# Patient Record
Sex: Male | Born: 1945 | Race: White | Hispanic: No | Smoking: Former smoker
Health system: Southern US, Community
[De-identification: ages and names within clinical notes are randomized; demographics above are authoritative.]

## PROBLEM LIST (undated history)

## (undated) DIAGNOSIS — R011 Cardiac murmur, unspecified: Secondary | ICD-10-CM

## (undated) DIAGNOSIS — Z9484 Stem cells transplant status: Secondary | ICD-10-CM

## (undated) DIAGNOSIS — G8929 Other chronic pain: Secondary | ICD-10-CM

## (undated) DIAGNOSIS — C9000 Multiple myeloma not having achieved remission: Secondary | ICD-10-CM

## (undated) DIAGNOSIS — E119 Type 2 diabetes mellitus without complications: Secondary | ICD-10-CM

## (undated) DIAGNOSIS — M549 Dorsalgia, unspecified: Secondary | ICD-10-CM

## (undated) DIAGNOSIS — I10 Essential (primary) hypertension: Secondary | ICD-10-CM

## (undated) DIAGNOSIS — K219 Gastro-esophageal reflux disease without esophagitis: Secondary | ICD-10-CM

## (undated) DIAGNOSIS — E785 Hyperlipidemia, unspecified: Secondary | ICD-10-CM

## (undated) DIAGNOSIS — G4730 Sleep apnea, unspecified: Secondary | ICD-10-CM

## (undated) DIAGNOSIS — R001 Bradycardia, unspecified: Secondary | ICD-10-CM

## (undated) DIAGNOSIS — F419 Anxiety disorder, unspecified: Secondary | ICD-10-CM

## (undated) DIAGNOSIS — I219 Acute myocardial infarction, unspecified: Secondary | ICD-10-CM

## (undated) DIAGNOSIS — I639 Cerebral infarction, unspecified: Secondary | ICD-10-CM

## (undated) DIAGNOSIS — Z8719 Personal history of other diseases of the digestive system: Secondary | ICD-10-CM

## (undated) DIAGNOSIS — J42 Unspecified chronic bronchitis: Secondary | ICD-10-CM

## (undated) DIAGNOSIS — F329 Major depressive disorder, single episode, unspecified: Secondary | ICD-10-CM

## (undated) DIAGNOSIS — J449 Chronic obstructive pulmonary disease, unspecified: Secondary | ICD-10-CM

## (undated) DIAGNOSIS — I959 Hypotension, unspecified: Secondary | ICD-10-CM

## (undated) DIAGNOSIS — R569 Unspecified convulsions: Secondary | ICD-10-CM

## (undated) DIAGNOSIS — F32A Depression, unspecified: Secondary | ICD-10-CM

## (undated) DIAGNOSIS — I2510 Atherosclerotic heart disease of native coronary artery without angina pectoris: Secondary | ICD-10-CM

## (undated) DIAGNOSIS — D649 Anemia, unspecified: Secondary | ICD-10-CM

## (undated) DIAGNOSIS — M1990 Unspecified osteoarthritis, unspecified site: Secondary | ICD-10-CM

## (undated) DIAGNOSIS — N189 Chronic kidney disease, unspecified: Secondary | ICD-10-CM

## (undated) DIAGNOSIS — B029 Zoster without complications: Secondary | ICD-10-CM

## (undated) DIAGNOSIS — Z8781 Personal history of (healed) traumatic fracture: Secondary | ICD-10-CM

## (undated) DIAGNOSIS — E039 Hypothyroidism, unspecified: Secondary | ICD-10-CM

## (undated) HISTORY — PX: LAPAROSCOPIC CHOLECYSTECTOMY: SUR755

## (undated) HISTORY — DX: Atherosclerotic heart disease of native coronary artery without angina pectoris: I25.10

## (undated) HISTORY — DX: Gastro-esophageal reflux disease without esophagitis: K21.9

## (undated) HISTORY — DX: Cerebral infarction, unspecified: I63.9

## (undated) HISTORY — DX: Major depressive disorder, single episode, unspecified: F32.9

## (undated) HISTORY — PX: TONSILLECTOMY: SUR1361

## (undated) HISTORY — DX: Multiple myeloma not having achieved remission: C90.00

## (undated) HISTORY — DX: Unspecified convulsions: R56.9

## (undated) HISTORY — DX: Zoster without complications: B02.9

## (undated) HISTORY — PX: CAROTID ARTERY ANGIOPLASTY: SHX1300

## (undated) HISTORY — DX: Depression, unspecified: F32.A

## (undated) SURGERY — Surgical Case
Anesthesia: *Unknown

---

## 2002-01-14 HISTORY — PX: CORONARY ARTERY BYPASS GRAFT: SHX141

## 2011-01-15 DIAGNOSIS — C9000 Multiple myeloma not having achieved remission: Secondary | ICD-10-CM

## 2011-01-15 HISTORY — DX: Multiple myeloma not having achieved remission: C90.00

## 2015-01-15 DIAGNOSIS — R569 Unspecified convulsions: Secondary | ICD-10-CM

## 2015-01-15 HISTORY — DX: Unspecified convulsions: R56.9

## 2015-10-06 ENCOUNTER — Emergency Department (HOSPITAL_COMMUNITY)
Admission: EM | Admit: 2015-10-06 | Discharge: 2015-10-06 | Disposition: A | Discharge status: Home / Self Care | Payer: Self-pay | Attending: Emergency Medicine | Admitting: Emergency Medicine

## 2015-10-06 ENCOUNTER — Encounter (HOSPITAL_COMMUNITY): Payer: Self-pay

## 2015-10-06 DIAGNOSIS — R739 Hyperglycemia, unspecified: Secondary | ICD-10-CM

## 2015-10-06 DIAGNOSIS — E039 Hypothyroidism, unspecified: Secondary | ICD-10-CM | POA: Insufficient documentation

## 2015-10-06 DIAGNOSIS — Z87891 Personal history of nicotine dependence: Secondary | ICD-10-CM | POA: Insufficient documentation

## 2015-10-06 DIAGNOSIS — I10 Essential (primary) hypertension: Secondary | ICD-10-CM | POA: Insufficient documentation

## 2015-10-06 DIAGNOSIS — E1165 Type 2 diabetes mellitus with hyperglycemia: Secondary | ICD-10-CM | POA: Insufficient documentation

## 2015-10-06 DIAGNOSIS — Z7982 Long term (current) use of aspirin: Secondary | ICD-10-CM | POA: Insufficient documentation

## 2015-10-06 DIAGNOSIS — J449 Chronic obstructive pulmonary disease, unspecified: Secondary | ICD-10-CM | POA: Insufficient documentation

## 2015-10-06 DIAGNOSIS — Z79899 Other long term (current) drug therapy: Secondary | ICD-10-CM | POA: Insufficient documentation

## 2015-10-06 HISTORY — DX: Hypothyroidism, unspecified: E03.9

## 2015-10-06 HISTORY — DX: Hyperlipidemia, unspecified: E78.5

## 2015-10-06 HISTORY — DX: Essential (primary) hypertension: I10

## 2015-10-06 HISTORY — DX: Chronic obstructive pulmonary disease, unspecified: J44.9

## 2015-10-06 LAB — CBG MONITORING, ED
GLUCOSE-CAPILLARY: 400 mg/dL — AB (ref 65–99)
Glucose-Capillary: 302 mg/dL — ABNORMAL HIGH (ref 65–99)

## 2015-10-06 LAB — CBC WITH DIFFERENTIAL/PLATELET
Basophils Absolute: 0 10*3/uL (ref 0.0–0.1)
Basophils Relative: 1 %
Eosinophils Absolute: 0 10*3/uL (ref 0.0–0.7)
Eosinophils Relative: 0 %
HCT: 32.1 % — ABNORMAL LOW (ref 39.0–52.0)
Hemoglobin: 10.7 g/dL — ABNORMAL LOW (ref 13.0–17.0)
Lymphocytes Relative: 16 %
Lymphs Abs: 1 10*3/uL (ref 0.7–4.0)
MCH: 27.3 pg (ref 26.0–34.0)
MCHC: 33.3 g/dL (ref 30.0–36.0)
MCV: 81.9 fL (ref 78.0–100.0)
Monocytes Absolute: 0.5 10*3/uL (ref 0.1–1.0)
Monocytes Relative: 8 %
Neutro Abs: 4.7 10*3/uL (ref 1.7–7.7)
Neutrophils Relative %: 75 %
Platelets: 110 10*3/uL — ABNORMAL LOW (ref 150–400)
RBC: 3.92 MIL/uL — ABNORMAL LOW (ref 4.22–5.81)
RDW: 16.7 % — ABNORMAL HIGH (ref 11.5–15.5)
WBC: 6.2 10*3/uL (ref 4.0–10.5)

## 2015-10-06 LAB — BASIC METABOLIC PANEL
Anion gap: 5 (ref 5–15)
BUN: 32 mg/dL — ABNORMAL HIGH (ref 6–20)
CO2: 26 mmol/L (ref 22–32)
Calcium: 9.3 mg/dL (ref 8.9–10.3)
Chloride: 105 mmol/L (ref 101–111)
Creatinine, Ser: 1.49 mg/dL — ABNORMAL HIGH (ref 0.61–1.24)
GFR calc Af Amer: 52 mL/min — ABNORMAL LOW (ref >60)
GFR calc non Af Amer: 45 mL/min — ABNORMAL LOW (ref >60)
Glucose, Bld: 397 mg/dL — ABNORMAL HIGH (ref 65–99)
Potassium: 5.9 mmol/L — ABNORMAL HIGH (ref 3.5–5.1)
Sodium: 136 mmol/L (ref 135–145)

## 2015-10-06 LAB — URINALYSIS, ROUTINE W REFLEX MICROSCOPIC
Bilirubin Urine: NEGATIVE
Glucose, UA: >1000 mg/dL — AB
Hgb urine dipstick: NEGATIVE
Ketones, ur: NEGATIVE mg/dL
Leukocytes, UA: NEGATIVE
Nitrite: NEGATIVE
Protein, ur: 30 mg/dL — AB
Specific Gravity, Urine: 1.022 (ref 1.005–1.030)
pH: 5.5 (ref 5.0–8.0)

## 2015-10-06 LAB — URINE MICROSCOPIC-ADD ON
Bacteria, UA: NONE SEEN
RBC / HPF: NONE SEEN RBC/hpf (ref 0–5)

## 2015-10-06 MED ORDER — SODIUM CHLORIDE 0.9 % IV BOLUS (SEPSIS)
1000.0000 mL | Freq: Once | INTRAVENOUS | Status: AC
  Administered 2015-10-06: 1000 mL via INTRAVENOUS

## 2015-10-06 MED ORDER — CARVEDILOL 12.5 MG PO TABS
12.5000 mg | ORAL_TABLET | Freq: Two times a day (BID) | ORAL | Status: DC
  Administered 2015-10-06: 12.5 mg via ORAL
  Filled 2015-10-06 (×3): qty 1

## 2015-10-06 MED ORDER — INSULIN ASPART 100 UNIT/ML ~~LOC~~ SOLN
5.0000 [IU] | Freq: Once | SUBCUTANEOUS | Status: AC
  Administered 2015-10-06: 5 [IU] via SUBCUTANEOUS
  Filled 2015-10-06: qty 1

## 2015-10-06 MED ORDER — CLONIDINE HCL 0.1 MG PO TABS
0.1000 mg | ORAL_TABLET | Freq: Two times a day (BID) | ORAL | Status: DC
  Administered 2015-10-06: 0.1 mg via ORAL
  Filled 2015-10-06: qty 1

## 2015-10-06 NOTE — ED Triage Notes (Signed)
Per EMS, Pt, from [facility] [phone] Dr, [place]), c/o weakness x 2 days and hyperglycemia.  Denies pain.  Initial BP 86/60.  1L NS given en route.  CBG 429.  Hx of DM and HTN.

## 2015-10-06 NOTE — ED Provider Notes (Signed)
[facility] DEPT Provider Note   CSN: [id number] Arrival date & time: 10/06/15  1359     History   Chief Complaint Chief Complaint  Patient presents with  . Weakness  . Hyperglycemia    HPI Douglas Mccoy is a 70 y.o. male.  HPI   70 year old male presents today with complaints of weakness and hyperglycemia. Patient notes a significant past medical history of hypoglycemia hypertension. Was recently hospitalized at the [facility] for hypertension. Home hospital he was noted to originally have elevated CBG, diet modifications in the hospital lowered his blood sugar down to a reasonable level, that did not require any medical intervention. He was discharged home without any medications for management of elevated blood sugar. Patient notes that since being discharged from the hospital he has been [facility] the homeless shelter. He notes that he has been drinking excessive amounts of Kool-Aid, and bread. Patient noted to have blood sugar in 400s today, fatigue, thirst. Patient denies any neurological deficits, abdominal pain nausea or vomiting. No recent illnesses.  Past Medical History:  Diagnosis Date  . COPD (chronic obstructive pulmonary disease) ([place])   . Diabetes mellitus without complication ([place])   . Hyperlipidemia   . Hypertension   . Hypothyroidism     There are no active problems to display for this patient.   Past Surgical History:  Procedure Laterality Date  . BACK SURGERY     fracture  . BYPASS GRAFT     x4  . CAROTID ARTERY ANGIOPLASTY     Right carotid artery "cleaned out"  . CHOLECYSTECTOMY         Home Medications    Prior to Admission medications   Medication Sig Start Date End Date Taking? Authorizing Provider  acyclovir (ZOVIRAX) 200 MG capsule Take 200 mg by mouth 2 (two) times daily.   Yes Historical Provider, MD  aspirin 325 MG tablet Take 325 mg by mouth daily.   Yes Historical Provider, MD  carvedilol (COREG) 12.5 MG tablet Take 6.25 mg by mouth  2 (two) times daily with a meal.   Yes Historical Provider, MD  cloNIDine (CATAPRES) 0.1 MG tablet Take 0.1 mg by mouth 2 (two) times daily.   Yes Historical Provider, MD  guaiFENesin (MUCINEX) 600 MG 12 hr tablet Take 600 mg by mouth 2 (two) times daily as needed for cough.    Yes Historical Provider, MD  ibuprofen (ADVIL,MOTRIN) 600 MG tablet Take 600 mg by mouth every 8 (eight) hours as needed for moderate pain.    Yes Historical Provider, MD  levETIRAcetam (KEPPRA) 500 MG tablet Take 500 mg by mouth 2 (two) times daily.   Yes Historical Provider, MD  levothyroxine (SYNTHROID, LEVOTHROID) 25 MCG tablet Take 25 mcg by mouth at bedtime.   Yes Historical Provider, MD  lisinopril (PRINIVIL,ZESTRIL) 40 MG tablet Take 40 mg by mouth daily.   Yes Historical Provider, MD  magnesium oxide (MAG-OX) 400 MG tablet Take 800 mg by mouth 2 (two) times daily.    Yes Historical Provider, MD  potassium chloride SA (K-DUR,KLOR-CON) 20 MEQ tablet Take 20 mEq by mouth daily.   Yes Historical Provider, MD  sertraline (ZOLOFT) 100 MG tablet Take 50 mg by mouth daily.   Yes Historical Provider, MD  simvastatin (ZOCOR) 10 MG tablet Take 10 mg by mouth at bedtime.   Yes Historical Provider, MD  tiZANidine (ZANAFLEX) 4 MG capsule Take 4 mg by mouth 3 (three) times daily as needed for muscle spasms.   Yes Historical  Provider, MD    Family History History reviewed. No pertinent family history.  Social History Social History  Substance Use Topics  . Smoking status: Former Research scientist (life sciences)  . Smokeless tobacco: Never Used  . Alcohol use No     Allergies   Amoxapine and related and Augmentin [amoxicillin-pot clavulanate]   Review of Systems Review of Systems  All other systems reviewed and are negative.    Physical Exam Updated Vital Signs BP 195/88   Pulse 64   Temp 97.7 F (36.5 C) (Oral)   Resp 18   SpO2 98%   Physical Exam  Constitutional: He is oriented to person, place, and time. He appears  well-developed and well-nourished.  HENT:  Head: Normocephalic and atraumatic.  Eyes: Conjunctivae are normal. Pupils are equal, round, and reactive to light. Right eye exhibits no discharge. Left eye exhibits no discharge. No scleral icterus.  Neck: Normal range of motion. No JVD present. No tracheal deviation present.  Cardiovascular: Normal rate and regular rhythm.   Pulmonary/Chest: Effort normal. No stridor.  Neurological: He is alert and oriented to person, place, and time. Coordination normal.  Psychiatric: He has a normal mood and affect. His behavior is normal. Judgment and thought content normal.  Nursing note and vitals reviewed.    ED Treatments / Results  Labs (all labs ordered are listed, but only abnormal results are displayed) Labs Reviewed  CBC WITH DIFFERENTIAL/PLATELET - Abnormal; Notable for the following:       Result Value   RBC 3.92 (*)    Hemoglobin 10.7 (*)    HCT 32.1 (*)    RDW 16.7 (*)    Platelets 110 (*)    All other components within normal limits  BASIC METABOLIC PANEL - Abnormal; Notable for the following:    Potassium 5.9 (*)    Glucose, Bld 397 (*)    BUN 32 (*)    Creatinine, Ser 1.49 (*)    GFR calc non Af Amer 45 (*)    GFR calc Af Amer 52 (*)    All other components within normal limits  URINALYSIS, ROUTINE W REFLEX MICROSCOPIC (NOT AT [facility]) - Abnormal; Notable for the following:    Glucose, UA >1000 (*)    Protein, ur 30 (*)    All other components within normal limits  URINE MICROSCOPIC-ADD ON - Abnormal; Notable for the following:    Squamous Epithelial / LPF 0-5 (*)    Casts HYALINE CASTS (*)    All other components within normal limits  CBG MONITORING, ED - Abnormal; Notable for the following:    Glucose-Capillary 400 (*)    All other components within normal limits  CBG MONITORING, ED - Abnormal; Notable for the following:    Glucose-Capillary 302 (*)    All other components within normal limits    EKG  EKG  Interpretation None       Radiology No results found.  Procedures Procedures (including critical care time)  Medications Ordered in ED Medications  sodium chloride 0.9 % bolus 1,000 mL (0 mLs Intravenous Stopped 10/06/15 1700)  insulin aspart (novoLOG) injection 5 Units (5 Units Subcutaneous Given 10/06/15 1629)     Initial Impression / Assessment and Plan / ED Course  I have reviewed the triage vital signs and the nursing notes.  Pertinent labs & imaging results that were available during my care of the patient were reviewed by me and considered in my medical decision making (see chart for details).  Clinical Course  Final Clinical Impressions(s) / ED Diagnoses   Final diagnoses:  Hyperglycemia  Essential hypertension  70 year old male presents today with hyperglycemia. He has no signs of hyperosmolar DKA. 5 units of regular insulin and a liter bolus provided significant reduction in his CBG done to 300. Patient reports dramatic improvement in his symptoms. Patient's blood pressure slowly elevating here in the ED, he had not taken his second dose of antihypertensive medications. He'll be given his medication here in the ED. Patient has no concerning signs or symptoms of end organ damage with hypertension, and be discharged home with close follow-up with primary care. Strict return percussion skin.  New Prescriptions Discharge Medication List as of 10/06/2015  7:21 PM       Okey Regal, PA-C 10/07/15 0133    Gwenyth Allegra Tegeler, MD 10/07/15 [phone]

## 2015-10-06 NOTE — Discharge Instructions (Signed)
Please read attached information. If you experience any new or worsening signs or symptoms please return to the emergency room for evaluation. Please follow-up with your primary care provider or specialist as discussed. Please use medication prescribed only as directed and discontinue taking if you have any concerning signs or symptoms.   °

## 2015-10-20 ENCOUNTER — Encounter (HOSPITAL_COMMUNITY): Payer: Self-pay | Admitting: Emergency Medicine

## 2015-10-20 ENCOUNTER — Inpatient Hospital Stay (HOSPITAL_COMMUNITY)
Admission: EM | Admit: 2015-10-20 | Discharge: 2015-10-24 | DRG: 683 | Disposition: A | Discharge status: Skilled Nursing Facility | Payer: Medicare Other | Attending: Internal Medicine | Admitting: Internal Medicine

## 2015-10-20 DIAGNOSIS — N189 Chronic kidney disease, unspecified: Secondary | ICD-10-CM | POA: Diagnosis present

## 2015-10-20 DIAGNOSIS — N179 Acute kidney failure, unspecified: Principal | ICD-10-CM | POA: Diagnosis present

## 2015-10-20 DIAGNOSIS — K529 Noninfective gastroenteritis and colitis, unspecified: Secondary | ICD-10-CM | POA: Diagnosis present

## 2015-10-20 DIAGNOSIS — I10 Essential (primary) hypertension: Secondary | ICD-10-CM | POA: Diagnosis present

## 2015-10-20 DIAGNOSIS — E875 Hyperkalemia: Secondary | ICD-10-CM | POA: Diagnosis present

## 2015-10-20 DIAGNOSIS — K760 Fatty (change of) liver, not elsewhere classified: Secondary | ICD-10-CM | POA: Diagnosis present

## 2015-10-20 DIAGNOSIS — Z880 Allergy status to penicillin: Secondary | ICD-10-CM

## 2015-10-20 DIAGNOSIS — I129 Hypertensive chronic kidney disease with stage 1 through stage 4 chronic kidney disease, or unspecified chronic kidney disease: Secondary | ICD-10-CM | POA: Diagnosis present

## 2015-10-20 DIAGNOSIS — M545 Low back pain: Secondary | ICD-10-CM | POA: Diagnosis present

## 2015-10-20 DIAGNOSIS — E872 Acidosis: Secondary | ICD-10-CM | POA: Diagnosis present

## 2015-10-20 DIAGNOSIS — E785 Hyperlipidemia, unspecified: Secondary | ICD-10-CM | POA: Diagnosis present

## 2015-10-20 DIAGNOSIS — Z833 Family history of diabetes mellitus: Secondary | ICD-10-CM | POA: Diagnosis not present

## 2015-10-20 DIAGNOSIS — Z85830 Personal history of malignant neoplasm of bone: Secondary | ICD-10-CM | POA: Diagnosis not present

## 2015-10-20 DIAGNOSIS — E440 Moderate protein-calorie malnutrition: Secondary | ICD-10-CM | POA: Diagnosis present

## 2015-10-20 DIAGNOSIS — Z8673 Personal history of transient ischemic attack (TIA), and cerebral infarction without residual deficits: Secondary | ICD-10-CM

## 2015-10-20 DIAGNOSIS — J449 Chronic obstructive pulmonary disease, unspecified: Secondary | ICD-10-CM | POA: Diagnosis present

## 2015-10-20 DIAGNOSIS — Z9481 Bone marrow transplant status: Secondary | ICD-10-CM

## 2015-10-20 DIAGNOSIS — R739 Hyperglycemia, unspecified: Secondary | ICD-10-CM

## 2015-10-20 DIAGNOSIS — Z951 Presence of aortocoronary bypass graft: Secondary | ICD-10-CM | POA: Diagnosis not present

## 2015-10-20 DIAGNOSIS — Z590 Homelessness: Secondary | ICD-10-CM

## 2015-10-20 DIAGNOSIS — G8929 Other chronic pain: Secondary | ICD-10-CM | POA: Diagnosis present

## 2015-10-20 DIAGNOSIS — R7989 Other specified abnormal findings of blood chemistry: Secondary | ICD-10-CM

## 2015-10-20 DIAGNOSIS — C9001 Multiple myeloma in remission: Secondary | ICD-10-CM | POA: Diagnosis present

## 2015-10-20 DIAGNOSIS — Z7982 Long term (current) use of aspirin: Secondary | ICD-10-CM

## 2015-10-20 DIAGNOSIS — C9000 Multiple myeloma not having achieved remission: Secondary | ICD-10-CM | POA: Diagnosis present

## 2015-10-20 DIAGNOSIS — E860 Dehydration: Secondary | ICD-10-CM | POA: Diagnosis present

## 2015-10-20 DIAGNOSIS — I2510 Atherosclerotic heart disease of native coronary artery without angina pectoris: Secondary | ICD-10-CM | POA: Diagnosis present

## 2015-10-20 DIAGNOSIS — E1122 Type 2 diabetes mellitus with diabetic chronic kidney disease: Secondary | ICD-10-CM | POA: Diagnosis present

## 2015-10-20 DIAGNOSIS — Z888 Allergy status to other drugs, medicaments and biological substances status: Secondary | ICD-10-CM

## 2015-10-20 DIAGNOSIS — E1151 Type 2 diabetes mellitus with diabetic peripheral angiopathy without gangrene: Secondary | ICD-10-CM

## 2015-10-20 DIAGNOSIS — Z6824 Body mass index (BMI) 24.0-24.9, adult: Secondary | ICD-10-CM

## 2015-10-20 DIAGNOSIS — Z79899 Other long term (current) drug therapy: Secondary | ICD-10-CM

## 2015-10-20 DIAGNOSIS — E039 Hypothyroidism, unspecified: Secondary | ICD-10-CM | POA: Diagnosis present

## 2015-10-20 DIAGNOSIS — N182 Chronic kidney disease, stage 2 (mild): Secondary | ICD-10-CM

## 2015-10-20 DIAGNOSIS — Z87891 Personal history of nicotine dependence: Secondary | ICD-10-CM | POA: Diagnosis not present

## 2015-10-20 DIAGNOSIS — G40909 Epilepsy, unspecified, not intractable, without status epilepticus: Secondary | ICD-10-CM | POA: Diagnosis present

## 2015-10-20 DIAGNOSIS — R945 Abnormal results of liver function studies: Secondary | ICD-10-CM

## 2015-10-20 LAB — GLUCOSE, CAPILLARY: Glucose-Capillary: 106 mg/dL — ABNORMAL HIGH (ref 65–99)

## 2015-10-20 LAB — SODIUM, URINE, RANDOM: Sodium, Ur: 58 mmol/L

## 2015-10-20 LAB — CBC WITH DIFFERENTIAL/PLATELET
Basophils Absolute: 0 10*3/uL (ref 0.0–0.1)
Basophils Relative: 0 %
Eosinophils Absolute: 0 10*3/uL (ref 0.0–0.7)
Eosinophils Relative: 0 %
HEMATOCRIT: 36 % — AB (ref 39.0–52.0)
Hemoglobin: 12.3 g/dL — ABNORMAL LOW (ref 13.0–17.0)
LYMPHS PCT: 13 %
Lymphs Abs: 1.2 10*3/uL (ref 0.7–4.0)
MCH: 27.5 pg (ref 26.0–34.0)
MCHC: 34.2 g/dL (ref 30.0–36.0)
MCV: 80.5 fL (ref 78.0–100.0)
MONO ABS: 0.8 10*3/uL (ref 0.1–1.0)
MONOS PCT: 9 %
NEUTROS ABS: 7.3 10*3/uL (ref 1.7–7.7)
Neutrophils Relative %: 78 %
Platelets: 144 10*3/uL — ABNORMAL LOW (ref 150–400)
RBC: 4.47 MIL/uL (ref 4.22–5.81)
RDW: 16.8 % — AB (ref 11.5–15.5)
WBC: 9.4 10*3/uL (ref 4.0–10.5)

## 2015-10-20 LAB — URINALYSIS, ROUTINE W REFLEX MICROSCOPIC
BILIRUBIN URINE: NEGATIVE
Glucose, UA: 250 mg/dL — AB
HGB URINE DIPSTICK: NEGATIVE
Ketones, ur: NEGATIVE mg/dL
Leukocytes, UA: NEGATIVE
Nitrite: NEGATIVE
PROTEIN: 30 mg/dL — AB
Specific Gravity, Urine: 1.016 (ref 1.005–1.030)
pH: 5.5 (ref 5.0–8.0)

## 2015-10-20 LAB — LIPASE, BLOOD: LIPASE: 36 U/L (ref 11–51)

## 2015-10-20 LAB — CBC
HCT: 40.7 % (ref 39.0–52.0)
Hemoglobin: 13.1 g/dL (ref 13.0–17.0)
MCH: 27.2 pg (ref 26.0–34.0)
MCHC: 32.2 g/dL (ref 30.0–36.0)
MCV: 84.4 fL (ref 78.0–100.0)
PLATELETS: 152 10*3/uL (ref 150–400)
RBC: 4.82 MIL/uL (ref 4.22–5.81)
RDW: 17.1 % — ABNORMAL HIGH (ref 11.5–15.5)
WBC: 12.2 10*3/uL — ABNORMAL HIGH (ref 4.0–10.5)

## 2015-10-20 LAB — OSMOLALITY, URINE: OSMOLALITY UR: 402 mosm/kg (ref 300–900)

## 2015-10-20 LAB — HEPATIC FUNCTION PANEL
ALT: 78 U/L — AB (ref 17–63)
AST: 56 U/L — ABNORMAL HIGH (ref 15–41)
Albumin: 4.8 g/dL (ref 3.5–5.0)
Alkaline Phosphatase: 87 U/L (ref 38–126)
BILIRUBIN DIRECT: 0.2 mg/dL (ref 0.1–0.5)
BILIRUBIN INDIRECT: 0.5 mg/dL (ref 0.3–0.9)
Total Bilirubin: 0.7 mg/dL (ref 0.3–1.2)
Total Protein: 9 g/dL — ABNORMAL HIGH (ref 6.5–8.1)

## 2015-10-20 LAB — URINE MICROSCOPIC-ADD ON
RBC / HPF: NONE SEEN RBC/hpf (ref 0–5)
SQUAMOUS EPITHELIAL / LPF: NONE SEEN

## 2015-10-20 LAB — I-STAT TROPONIN, ED: TROPONIN I, POC: 0.01 ng/mL (ref 0.00–0.08)

## 2015-10-20 LAB — BASIC METABOLIC PANEL
ANION GAP: 7 (ref 5–15)
Anion gap: 7 (ref 5–15)
BUN: 92 mg/dL — AB (ref 6–20)
BUN: 90 mg/dL — ABNORMAL HIGH (ref 6–20)
CALCIUM: 9.2 mg/dL (ref 8.9–10.3)
CALCIUM: 9.6 mg/dL (ref 8.9–10.3)
CO2: 13 mmol/L — ABNORMAL LOW (ref 22–32)
CO2: 16 mmol/L — ABNORMAL LOW (ref 22–32)
CREATININE: 2.87 mg/dL — AB (ref 0.61–1.24)
Chloride: 113 mmol/L — ABNORMAL HIGH (ref 101–111)
Chloride: 110 mmol/L (ref 101–111)
Creatinine, Ser: 2.71 mg/dL — ABNORMAL HIGH (ref 0.61–1.24)
GFR calc Af Amer: 24 mL/min — ABNORMAL LOW (ref >60)
GFR, EST AFRICAN AMERICAN: 25 mL/min — AB (ref >60)
GFR, EST NON AFRICAN AMERICAN: 20 mL/min — AB (ref >60)
GFR, EST NON AFRICAN AMERICAN: 22 mL/min — AB (ref >60)
GLUCOSE: 165 mg/dL — AB (ref 65–99)
Glucose, Bld: 178 mg/dL — ABNORMAL HIGH (ref 65–99)
Potassium: >7.5 mmol/L (ref 3.5–5.1)
Potassium: >7.5 mmol/L (ref 3.5–5.1)
SODIUM: 133 mmol/L — AB (ref 135–145)
SODIUM: 133 mmol/L — AB (ref 135–145)

## 2015-10-20 LAB — CBG MONITORING, ED
GLUCOSE-CAPILLARY: 223 mg/dL — AB (ref 65–99)
Glucose-Capillary: 152 mg/dL — ABNORMAL HIGH (ref 65–99)
Glucose-Capillary: 191 mg/dL — ABNORMAL HIGH (ref 65–99)

## 2015-10-20 LAB — LACTATE DEHYDROGENASE: LDH: 190 U/L (ref 98–192)

## 2015-10-20 LAB — CREATININE, URINE, RANDOM: Creatinine, Urine: 84.48 mg/dL

## 2015-10-20 MED ORDER — CALCIUM GLUCONATE 10 % IV SOLN
1.0000 g | Freq: Once | INTRAVENOUS | Status: AC
  Administered 2015-10-20: 1 g via INTRAVENOUS
  Filled 2015-10-20: qty 10

## 2015-10-20 MED ORDER — SERTRALINE HCL 50 MG PO TABS
50.0000 mg | ORAL_TABLET | Freq: Every day | ORAL | Status: DC
  Administered 2015-10-21 – 2015-10-24 (×4): 50 mg via ORAL
  Filled 2015-10-20 (×4): qty 1

## 2015-10-20 MED ORDER — LEVETIRACETAM 500 MG PO TABS
500.0000 mg | ORAL_TABLET | Freq: Two times a day (BID) | ORAL | Status: DC
  Administered 2015-10-20 – 2015-10-24 (×8): 500 mg via ORAL
  Filled 2015-10-20 (×8): qty 1

## 2015-10-20 MED ORDER — ACETAMINOPHEN 325 MG PO TABS: 650.0000 mg | ORAL_TABLET | Freq: Four times a day (QID) | ORAL | Status: DC | PRN

## 2015-10-20 MED ORDER — ONDANSETRON HCL 4 MG PO TABS: 4.0000 mg | ORAL_TABLET | Freq: Four times a day (QID) | ORAL | Status: DC | PRN

## 2015-10-20 MED ORDER — SODIUM CHLORIDE 0.9 % IV BOLUS (SEPSIS)
1000.0000 mL | Freq: Once | INTRAVENOUS | Status: AC
  Administered 2015-10-20: 1000 mL via INTRAVENOUS

## 2015-10-20 MED ORDER — CLONIDINE HCL 0.1 MG PO TABS
0.1000 mg | ORAL_TABLET | Freq: Two times a day (BID) | ORAL | Status: DC
  Administered 2015-10-20 – 2015-10-21 (×2): 0.1 mg via ORAL
  Filled 2015-10-20 (×2): qty 1

## 2015-10-20 MED ORDER — INSULIN ASPART 100 UNIT/ML ~~LOC~~ SOLN
0.0000 [IU] | Freq: Three times a day (TID) | SUBCUTANEOUS | Status: DC
  Administered 2015-10-21: 5 [IU] via SUBCUTANEOUS
  Administered 2015-10-21 – 2015-10-23 (×5): 2 [IU] via SUBCUTANEOUS
  Administered 2015-10-23: 3 [IU] via SUBCUTANEOUS
  Administered 2015-10-23 – 2015-10-24 (×2): 1 [IU] via SUBCUTANEOUS

## 2015-10-20 MED ORDER — INSULIN ASPART 100 UNIT/ML ~~LOC~~ SOLN: 0.0000 [IU] | Freq: Every day | SUBCUTANEOUS | Status: DC

## 2015-10-20 MED ORDER — HYDROCODONE-ACETAMINOPHEN 5-325 MG PO TABS
1.0000 | ORAL_TABLET | ORAL | Status: DC | PRN
  Administered 2015-10-21 (×2): 1 via ORAL
  Administered 2015-10-22 – 2015-10-24 (×7): 2 via ORAL
  Filled 2015-10-20 (×9): qty 2

## 2015-10-20 MED ORDER — CARVEDILOL 6.25 MG PO TABS
6.2500 mg | ORAL_TABLET | Freq: Two times a day (BID) | ORAL | Status: DC
  Administered 2015-10-20 – 2015-10-21 (×2): 6.25 mg via ORAL
  Filled 2015-10-20 (×2): qty 1

## 2015-10-20 MED ORDER — SODIUM CHLORIDE 0.9% FLUSH
3.0000 mL | Freq: Two times a day (BID) | INTRAVENOUS | Status: DC
  Administered 2015-10-21 – 2015-10-24 (×5): 3 mL via INTRAVENOUS

## 2015-10-20 MED ORDER — SIMVASTATIN 10 MG PO TABS
10.0000 mg | ORAL_TABLET | Freq: Every day | ORAL | Status: DC
  Administered 2015-10-20 – 2015-10-23 (×4): 10 mg via ORAL
  Filled 2015-10-20 (×4): qty 1

## 2015-10-20 MED ORDER — ACETAMINOPHEN 650 MG RE SUPP: 650.0000 mg | Freq: Four times a day (QID) | RECTAL | Status: DC | PRN

## 2015-10-20 MED ORDER — LEVOTHYROXINE SODIUM 25 MCG PO TABS
25.0000 ug | ORAL_TABLET | Freq: Every day | ORAL | Status: DC
  Administered 2015-10-20 – 2015-10-21 (×2): 25 ug via ORAL
  Filled 2015-10-20 (×2): qty 1

## 2015-10-20 MED ORDER — INSULIN ASPART 100 UNIT/ML IV SOLN
10.0000 [IU] | Freq: Once | INTRAVENOUS | Status: AC
  Administered 2015-10-20: 10 [IU] via INTRAVENOUS
  Filled 2015-10-20: qty 0.1

## 2015-10-20 MED ORDER — ASPIRIN 325 MG PO TABS
325.0000 mg | ORAL_TABLET | Freq: Every day | ORAL | Status: DC
  Administered 2015-10-21 – 2015-10-24 (×4): 325 mg via ORAL
  Filled 2015-10-20 (×4): qty 1

## 2015-10-20 MED ORDER — SODIUM POLYSTYRENE SULFONATE 15 GM/60ML PO SUSP
60.0000 g | Freq: Once | ORAL | Status: AC
  Administered 2015-10-20: 60 g via ORAL
  Filled 2015-10-20: qty 240

## 2015-10-20 MED ORDER — ONDANSETRON HCL 4 MG/2ML IJ SOLN: 4.0000 mg | Freq: Four times a day (QID) | INTRAMUSCULAR | Status: DC | PRN

## 2015-10-20 MED ORDER — SODIUM CHLORIDE 0.9 % IV SOLN: INTRAVENOUS | Status: AC

## 2015-10-20 MED ORDER — DEXTROSE 50 % IV SOLN
1.0000 | Freq: Once | INTRAVENOUS | Status: AC
  Administered 2015-10-20: 50 mL via INTRAVENOUS
  Filled 2015-10-20: qty 50

## 2015-10-20 MED ORDER — ALBUTEROL SULFATE (2.5 MG/3ML) 0.083% IN NEBU
10.0000 mg | INHALATION_SOLUTION | Freq: Once | RESPIRATORY_TRACT | Status: AC
  Administered 2015-10-20: 10 mg via RESPIRATORY_TRACT
  Filled 2015-10-20 (×2): qty 12

## 2015-10-20 MED ORDER — ENOXAPARIN SODIUM 30 MG/0.3ML ~~LOC~~ SOLN
30.0000 mg | SUBCUTANEOUS | Status: DC
  Administered 2015-10-20 – 2015-10-23 (×4): 30 mg via SUBCUTANEOUS
  Filled 2015-10-20 (×4): qty 0.3

## 2015-10-20 NOTE — ED Provider Notes (Signed)
[facility] DEPT Provider Note   CSN: [id number] Arrival date & time: 10/20/15  1430     History   Chief Complaint Chief Complaint  Patient presents with  . Dizziness  . Back Pain    HPI Douglas Mccoy is a 70 y.o. male.  Patient presents emergency department with chief complaint of dizziness. He states that he has been feeling increasingly dizzy over the past week. States that he was seen recently for the same. States that his symptoms were attributed to his blood pressure, which is now well controlled. He reports having history of stroke as well as a history of "bone cancer." He denies any fevers, chills, nausea, or vomiting. Denies any chest pain or shortness breath. States that he feels off balance when he is walking. He denies any numbness, weakness, or tingling of his extremities. Denies any vision changes, slurred speechThere are no other associated symptoms. He states that he is normally followed at the [facility] in [place].    The history is provided by the patient. No language interpreter was used.    Past Medical History:  Diagnosis Date  . COPD (chronic obstructive pulmonary disease) ([place])   . Diabetes mellitus without complication ([place])   . Hyperlipidemia   . Hypertension   . Hypothyroidism     There are no active problems to display for this patient.   Past Surgical History:  Procedure Laterality Date  . BACK SURGERY     fracture  . BYPASS GRAFT     x4  . CAROTID ARTERY ANGIOPLASTY     Right carotid artery "cleaned out"  . CHOLECYSTECTOMY         Home Medications    Prior to Admission medications   Medication Sig Start Date End Date Taking? Authorizing Provider  acyclovir (ZOVIRAX) 200 MG capsule Take 200 mg by mouth 2 (two) times daily.    Historical Provider, MD  aspirin 325 MG tablet Take 325 mg by mouth daily.    Historical Provider, MD  carvedilol (COREG) 12.5 MG tablet Take 6.25 mg by mouth 2 (two) times daily with a meal.    Historical  Provider, MD  cloNIDine (CATAPRES) 0.1 MG tablet Take 0.1 mg by mouth 2 (two) times daily.    Historical Provider, MD  guaiFENesin (MUCINEX) 600 MG 12 hr tablet Take 600 mg by mouth 2 (two) times daily as needed for cough.     Historical Provider, MD  ibuprofen (ADVIL,MOTRIN) 600 MG tablet Take 600 mg by mouth every 8 (eight) hours as needed for moderate pain.     Historical Provider, MD  levETIRAcetam (KEPPRA) 500 MG tablet Take 500 mg by mouth 2 (two) times daily.    Historical Provider, MD  levothyroxine (SYNTHROID, LEVOTHROID) 25 MCG tablet Take 25 mcg by mouth at bedtime.    Historical Provider, MD  lisinopril (PRINIVIL,ZESTRIL) 40 MG tablet Take 40 mg by mouth daily.    Historical Provider, MD  magnesium oxide (MAG-OX) 400 MG tablet Take 800 mg by mouth 2 (two) times daily.     Historical Provider, MD  potassium chloride SA (K-DUR,KLOR-CON) 20 MEQ tablet Take 20 mEq by mouth daily.    Historical Provider, MD  sertraline (ZOLOFT) 100 MG tablet Take 50 mg by mouth daily.    Historical Provider, MD  simvastatin (ZOCOR) 10 MG tablet Take 10 mg by mouth at bedtime.    Historical Provider, MD  tiZANidine (ZANAFLEX) 4 MG capsule Take 4 mg by mouth 3 (three) times daily as needed  for muscle spasms.    Historical Provider, MD    Family History No family history on file.  Social History Social History  Substance Use Topics  . Smoking status: Former Research scientist (life sciences)  . Smokeless tobacco: Never Used  . Alcohol use No     Allergies   Amoxapine and related and Augmentin [amoxicillin-pot clavulanate]   Review of Systems Review of Systems  Neurological: Positive for dizziness.  All other systems reviewed and are negative.    Physical Exam Updated Vital Signs BP 120/71 (BP Location: Left Arm)   Pulse (!) 57   Temp 97.7 F (36.5 C) (Oral)   Resp 20   Ht _0  (1.651 m)   Wt 65.8 kg   SpO2 98%   BMI 24.13 kg/m   Physical Exam  Constitutional: He is oriented to person, place, and time. He  appears well-developed and well-nourished.  HENT:  Head: Normocephalic and atraumatic.  Right Ear: External ear normal.  Left Ear: External ear normal.  Eyes: Conjunctivae and EOM are normal. Pupils are equal, round, and reactive to light.  Neck: Normal range of motion. Neck supple.  No pain with neck flexion, no meningismus  Cardiovascular: Normal rate, regular rhythm and normal heart sounds.  Exam reveals no gallop and no friction rub.   No murmur heard. Pulmonary/Chest: Effort normal and breath sounds normal. No respiratory distress. He has no wheezes. He has no rales. He exhibits no tenderness.  Abdominal: Soft. He exhibits no distension and no mass. There is no tenderness. There is no rebound and no guarding.  Musculoskeletal: Normal range of motion. He exhibits no edema or tenderness.  Normal gait.  Neurological: He is alert and oriented to person, place, and time. He has normal reflexes.  CN 3-12 intact, normal finger to nose, no pronator drift, sensation and strength intact bilaterally.  Skin: Skin is warm and dry.  Psychiatric: He has a normal mood and affect. His behavior is normal. Judgment and thought content normal.  Nursing note and vitals reviewed.    ED Treatments / Results  Labs (all labs ordered are listed, but only abnormal results are displayed) Labs Reviewed  CBC - Abnormal; Notable for the following:       Result Value   WBC 12.2 (*)    RDW 17.1 (*)    All other components within normal limits  CBG MONITORING, ED - Abnormal; Notable for the following:    Glucose-Capillary 223 (*)    All other components within normal limits  URINALYSIS, ROUTINE W REFLEX MICROSCOPIC (NOT AT [facility])  BASIC METABOLIC PANEL  LIPASE, BLOOD  HEPATIC FUNCTION PANEL  I-STAT TROPOININ, ED    EKG  EKG Interpretation  Date/Time:  Friday October 20 2015 15:01:02 EDT Ventricular Rate:  60 PR Interval:    QRS Duration: 100 QT Interval:  409 QTC Calculation: 409 R  Axis:   -12 Text Interpretation:  Sinus rhythm Borderline prolonged PR interval Baseline wander in lead(s) V2 V3 V6 Confirmed by Jeneen Rinks  MD, [place] ([id number]) on 10/20/2015 4:05:58 PM       Radiology No results found.  Procedures Procedures (including critical care time)  Medications Ordered in ED Medications  sodium chloride 0.9 % bolus 1,000 mL (not administered)     Initial Impression / Assessment and Plan / ED Course  I have reviewed the triage vital signs and the nursing notes.  Pertinent labs & imaging results that were available during my care of the patient were reviewed by me and  considered in my medical decision making (see chart for details).  Clinical Course    Patient with worsening dizziness 1 week. States he feels off-balance. Feels like he is going to fall. Has to walk with cane. Reports a history of stroke and cancer, although these are not listed on his medical record. Feel that given length of symptoms, and worsening nature, will need to check MRI to rule out cerebellar stroke vs met.  8:10 PM Critical potassium of >7.5.  I suspect that this is what is contributing to his dizziness. Will discontinue MRI. Patient discussed with Dr. Leonette Monarch, who recommends starting insulin, albuterol, and calcium.    Medications  sodium chloride 0.9 % bolus 1,000 mL (1,000 mLs Intravenous New Bag/Given 10/20/15 2035)  calcium gluconate inj 10% (1 g) URGENT USE ONLY! (1 g Intravenous Given 10/20/15 2045)  albuterol (PROVENTIL) (2.5 MG/3ML) 0.083% nebulizer solution 10 mg (10 mg Nebulization Given 10/20/15 2043)  insulin aspart (novoLOG) injection 10 Units (10 Units Intravenous Given 10/20/15 2044)  dextrose 50 % solution 50 mL (50 mLs Intravenous Given 10/20/15 2042)    CRITICAL CARE Performed by: Montine Circle   Total critical care time: 45 minutes  Critical care time was exclusive of separately billable procedures and treating other patients.  Critical care was necessary to  treat or prevent imminent or life-threatening deterioration.  Critical care was time spent personally by me on the following activities: development of treatment plan with patient and/or surrogate as well as nursing, discussions with consultants, evaluation of patient's response to treatment, examination of patient, obtaining history from patient or surrogate, ordering and performing treatments and interventions, ordering and review of laboratory studies, ordering and review of radiographic studies, pulse oximetry and re-evaluation of patient's condition.   Appreciate Dr. Roel Cluck for admitting the patient.     Final Clinical Impressions(s) / ED Diagnoses   Final diagnoses:  Hyperkalemia    New Prescriptions New Prescriptions   No medications on file     Montine Circle, PA-C 10/20/15 2146    Fatima Blank, MD 10/21/15 [phone]

## 2015-10-20 NOTE — H&P (Addendum)
Douglas Mccoy WLN:[id number] DOB: 09-23-1945 DOA: 10/20/2015     PCP:  [facility]: none Patient coming from:  Homeless    Chief Complaint: lightheaded  HPI: Douglas Mccoy is a 70 y.o. 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 Recently his blood sugar has been under poor control he has had some episodes of high blood sugar.  Patient has chronic diarrhea. No nausea no vomiting no fevers or chills. No chest pain shortness of breath. No neurological complaints. He states usually his potassium is low and he has to suplement it denies any new medications. He has not been eating well and reports very poor appetite.     Patient has hx of CAD sp CABG with perioperative CVA in 2004. Patient has had 2 seizures and have been on Keppra. Regarding multiple Myeloma he is sp bone marrow transplant in 2013 and currently not on any chemotherapy states he is in remission will establish care with [facility] oncology regarding this.    IN ER:  Temp (24hrs), Avg:97.7 F (36.5 C), Min:97.7 F (36.5 C), Max:97.7 F (36.5 C) Heart rate 62 at pressure 169/70 Glucose 191 sodium 133 potassium greater than 7.5 creatinine 2.7 up from baseline 1.5 in September  Protein 9.0 albumin 4.8 AST 56 ALT 78 WBC 12.2 hemoglobin 13.1 Following Medications were ordered in ER: Medications  sodium chloride 0.9 % bolus 1,000 mL (1,000 mLs Intravenous New Bag/Given 10/20/15 2035)  calcium gluconate inj 10% (1 g) URGENT USE ONLY! (1 g Intravenous Given 10/20/15 2045)  albuterol (PROVENTIL) (2.5 MG/3ML) 0.083% nebulizer solution 10 mg (10 mg Nebulization Given 10/20/15 2043)  insulin aspart (novoLOG) injection 10 Units (10 Units Intravenous Given 10/20/15 2044)  dextrose 50 % solution 50 mL (50 mLs Intravenous Given 10/20/15 2042)       Hospitalist was called for admission for Acute on chronic renal failure and hyperkalemia in the setting of dehydration  Review of Systems:    Pertinent positives include: fatigue,   Constitutional:  No weight loss, night sweats, Fevers, chills, weight loss  HEENT:  No headaches, Difficulty swallowing,Tooth/dental problems,Sore throat,  No sneezing, itching, ear ache, nasal congestion, post nasal drip,  Cardio-vascular:  No chest pain, Orthopnea, PND, anasarca, dizziness, palpitations.no Bilateral lower extremity swelling  GI:  No heartburn, indigestion, abdominal pain, nausea, vomiting, diarrhea, change in bowel habits, loss of appetite, melena, blood in stool, hematemesis Resp:  no shortness of breath at rest. No dyspnea on exertion, No excess mucus, no productive cough, No non-productive cough, No coughing up of blood.No change in color of mucus.No wheezing. Skin:  no rash or lesions. No jaundice GU:  no dysuria, change in color of urine, no urgency or frequency. No straining to urinate.  No flank pain.  Musculoskeletal:  No joint pain or no joint swelling. No decreased range of motion. No back pain.  Psych:  No change in mood or affect. No depression or anxiety. No memory loss.  Neuro: no localizing neurological complaints, no tingling, no weakness, no double vision, no gait abnormality, no slurred speech, no confusion  As per HPI otherwise 10 point review of systems negative.   Past Medical History: Past Medical History:  Diagnosis Date  . COPD (chronic obstructive pulmonary disease) ([place])   . Diabetes mellitus without complication ([place])   . Hyperlipidemia   .  Hypertension   . Hypothyroidism    Past Surgical History:  Procedure Laterality Date  . BACK SURGERY     fracture  . BYPASS GRAFT     x4  . CAROTID ARTERY ANGIOPLASTY     Right carotid artery "cleaned out"  . CHOLECYSTECTOMY       Social History:  Ambulatory  Douglas Mccoy     reports that he has quit  smoking. He has never used smokeless tobacco. He reports that he does not drink alcohol or use drugs.  Allergies:   Allergies  Allergen Reactions  . Amoxapine And Related     unknown  . Augmentin [Amoxicillin-Pot Clavulanate] Other (See Comments)    Unknown reaction Has patient had a PCN reaction causing immediate rash, facial/tongue/throat swelling, SOB or lightheadedness with hypotension: unknown Has patient had a PCN reaction causing severe rash involving mucus membranes or skin necrosis: unknown Has patient had a PCN reaction that required hospitalization: unknown Has patient had a PCN reaction occurring within the last 10 years: no If all of the above answers are "NO", then may proceed with Cephalosporin use.        Family History:   Family History  Problem Relation Age of Onset  . Diabetes Mother   . Cancer Neg Hx   . Stroke Neg Hx     Medications: Prior to Admission medications   Medication Sig Start Date End Date Taking? Authorizing Provider  acyclovir (ZOVIRAX) 200 MG capsule Take 200 mg by mouth 2 (two) times daily.   Yes Historical Provider, MD  aspirin 325 MG tablet Take 325 mg by mouth daily.   Yes Historical Provider, MD  carvedilol (COREG) 12.5 MG tablet Take 6.25 mg by mouth 2 (two) times daily with a meal.    Historical Provider, MD  cloNIDine (CATAPRES) 0.1 MG tablet Take 0.1 mg by mouth 2 (two) times daily.    Historical Provider, MD  guaiFENesin (MUCINEX) 600 MG 12 hr tablet Take 600 mg by mouth 2 (two) times daily as needed for cough.     Historical Provider, MD  ibuprofen (ADVIL,MOTRIN) 600 MG tablet Take 600 mg by mouth every 8 (eight) hours as needed for moderate pain.     Historical Provider, MD  levETIRAcetam (KEPPRA) 500 MG tablet Take 500 mg by mouth 2 (two) times daily.    Historical Provider, MD  levothyroxine (SYNTHROID, LEVOTHROID) 25 MCG tablet Take 25 mcg by mouth at bedtime.    Historical Provider, MD  lisinopril (PRINIVIL,ZESTRIL) 40 MG  tablet Take 40 mg by mouth daily.    Historical Provider, MD  magnesium oxide (MAG-OX) 400 MG tablet Take 800 mg by mouth 2 (two) times daily.     Historical Provider, MD  potassium chloride SA (K-DUR,KLOR-CON) 20 MEQ tablet Take 20 mEq by mouth daily.    Historical Provider, MD  sertraline (ZOLOFT) 100 MG tablet Take 50 mg by mouth daily.    Historical Provider, MD  simvastatin (ZOCOR) 10 MG tablet Take 10 mg by mouth at bedtime.    Historical Provider, MD  tiZANidine (ZANAFLEX) 4 MG capsule Take 4 mg by mouth 3 (three) times daily as needed for muscle spasms.    Historical Provider, MD    Physical Exam: Patient Vitals for the past 24 hrs:  BP Temp Temp src Pulse Resp SpO2 Height Weight  10/20/15 2101 - - - - - 99 % - -  10/20/15 2059 - - - - - 100 % - -  10/20/15 7169 168/58 - - -  18 100 % - -  10/20/15 1718 120/71 - - (!) 57 20 98 % - -  10/20/15 1455 113/67 - - - - - _0  (1.651 m) 65.8 kg (145 lb)  10/20/15 1454 (!) 109/49 97.7 F (36.5 C) Oral 61 16 100 % - -  10/20/15 1441 - - - - - 99 % - -    1. General:  in No Acute distress 2. Psychological: Alert and Oriented 3. Head/ENT:  Dry Mucous Membranes                          Head Non traumatic, neck supple                           Poor Dentition 4. SKIN: decreased Skin turgor,  Skin clean Dry and intact no rash 5. Heart: Regular rate and rhythm no Murmur, Rub or gallop 6. Lungs: Clear to auscultation bilaterally, no wheezes or crackles   7. Abdomen: Soft, non-tender, Non distended 8. Lower extremities: no clubbing, cyanosis, or edema 9. Neurologically Grossly intact, moving all 4 extremities equally  10. MSK: Normal range of motion   body mass index is 24.13 kg/m.  Labs on Admission:   Labs on Admission: I have personally reviewed following labs and imaging studies  CBC:  Recent Labs Lab 10/20/15 1612  WBC 12.2*  HGB 13.1  HCT 40.7  MCV 84.4  PLT 546   Basic Metabolic Panel:  Recent Labs Lab  10/20/15 1919  NA 133*  K >7.5*  CL 113*  CO2 13*  GLUCOSE 178*  BUN 92*  CREATININE 2.87*  CALCIUM 9.2   GFR: Estimated Creatinine Clearance: 20.2 mL/min (by C-G formula based on SCr of 2.87 mg/dL (H)). Liver Function Tests:  Recent Labs Lab 10/20/15 1919  AST 56*  ALT 78*  ALKPHOS 87  BILITOT 0.7  PROT 9.0*  ALBUMIN 4.8    Recent Labs Lab 10/20/15 1919  LIPASE 36   No results for input(s): AMMONIA in the last 168 hours. Coagulation Profile: No results for input(s): INR, PROTIME in the last 168 hours. Cardiac Enzymes: No results for input(s): CKTOTAL, CKMB, CKMBINDEX, TROPONINI in the last 168 hours. BNP (last 3 results) No results for input(s): PROBNP in the last 8760 hours. HbA1C: No results for input(s): HGBA1C in the last 72 hours. CBG:  Recent Labs Lab 10/20/15 1502 10/20/15 2040  GLUCAP 223* 152*   Lipid Profile: No results for input(s): CHOL, HDL, LDLCALC, TRIG, CHOLHDL, LDLDIRECT in the last 72 hours. Thyroid Function Tests: No results for input(s): TSH, T4TOTAL, FREET4, T3FREE, THYROIDAB in the last 72 hours. Anemia Panel: No results for input(s): VITAMINB12, FOLATE, FERRITIN, TIBC, IRON, RETICCTPCT in the last 72 hours. Urine analysis:    Component Value Date/Time   COLORURINE YELLOW 10/20/2015 [place] 10/20/2015 1457   LABSPEC 1.016 10/20/2015 1457   PHURINE 5.5 10/20/2015 1457   GLUCOSEU 250 (A) 10/20/2015 1457   HGBUR NEGATIVE 10/20/2015 1457   BILIRUBINUR NEGATIVE 10/20/2015 1457   KETONESUR NEGATIVE 10/20/2015 1457   PROTEINUR 30 (A) 10/20/2015 1457   NITRITE NEGATIVE 10/20/2015 1457   LEUKOCYTESUR NEGATIVE 10/20/2015 1457   Sepsis Labs: _1 (procalcitonin:4,lacticidven:4) )No results found for this or any previous visit (from the past 240 hour(s)).   UAno evidence of UTI    No results found for: HGBA1C  Estimated Creatinine Clearance: 20.2 mL/min (by C-G formula based on  SCr of 2.87 mg/dL (H)).  BNP  (last 3 results) No results for input(s): PROBNP in the last 8760 hours.   ECG REPORT  Independently reviewed Rate: 60  Rhythm: NSR ST&T Change: No acute ischemic changes  No evidence of peaked T waves noted QTC 409  Filed Weights   10/20/15 1455  Weight: 65.8 kg (145 lb)     Cultures: No results found for: SDES, SPECREQUEST, CULT, REPTSTATUS   Radiological Exams on Admission: No results found.  Chart has been reviewed    Assessment/Plan 70 y.o. male with medical history significant of COPD, DM2 HL, HTN, chronic diarrhea for 8 years  CVA, bone cancer, being admitted for acute on chronic renal failure and hyperkalemia Present on Admission: . Acute renal failure (ARF) (HCC) - likely secondary to dehydration secondary to decreased by mouth intake. Will hold lisinopril obtain urine electrolytes hold her cycle varies this can cause sometimes renal toxicity . Hyperkalemia- severe we will obtain frequent flaps monitor and step down. Patient has been treated with albuterol, calcium, insulin glucose, has ordered kayexalate Stop potassium supplementation and lisinopril . Hypertension continue Coreg and clonidine . COPD (chronic obstructive pulmonary disease) ([place]) stable continue to monitor . Multiple myeloma ([place]) patient states currently in remission will follow up with [facility] Coronary disease currently stable continue aspirin and statin History CVA currently stable continue aspirin History of seizure disorder - continue Keppra Dehydration will rehydrate and follow vitals nutritional consult Other plan as per orders.  DVT prophylaxis:    Lovenox     Code Status:  FULL CODE  as per patient    Family Communication:   Family not  at  Bedside   Disposition Plan:  Will need housing adressed                        Would benefit from PT/OT eval prior to DC  ordered                       Social Work  will evaluate given homelessness Nutrition  consulted                           Consults called: none   Admission status:  inpatient  Level of care    tele       medical floor       SDU      I have spent a total of 67 min on this admission   Sunday Klos 10/20/2015, 10:22 PM    [facility]  Pager [phone]   after 2 AM please page floor coverage PA If 7AM-7PM, please contact the day team taking care of the patient  [email or web address]  Password [email or web address]

## 2015-10-20 NOTE — Progress Notes (Signed)
Patient listed as having Medicare insurance without a pcp.  EDCM spoke to patient at bedside.  Patient reports his pcp is located at the [facility] medical center in [place].  System updated.

## 2015-10-20 NOTE — ED Notes (Signed)
Lab called needing a recollect on the BMP -  Pt doesn't want to be stuck right now.  Will let us stick him when he gets a room -  Preferably at the same time he gets the IV.  (he is aware that if he doesn't get an IV, we will still stick him)

## 2015-10-20 NOTE — ED Triage Notes (Signed)
Per EMS pt complaint of acute chronic lower back pain and intermittent dizziness for two weeks. Pt reports hx of chronic diarrhea for 8 years.

## 2015-10-20 NOTE — ED Notes (Signed)
Jeneen Rinks MD aware of pt status and complaint. Verbal to place istat trop.

## 2015-10-21 ENCOUNTER — Inpatient Hospital Stay (HOSPITAL_COMMUNITY): Payer: Medicare Other

## 2015-10-21 DIAGNOSIS — C9001 Multiple myeloma in remission: Secondary | ICD-10-CM

## 2015-10-21 DIAGNOSIS — E875 Hyperkalemia: Secondary | ICD-10-CM

## 2015-10-21 DIAGNOSIS — N179 Acute kidney failure, unspecified: Principal | ICD-10-CM

## 2015-10-21 DIAGNOSIS — Z590 Homelessness: Secondary | ICD-10-CM

## 2015-10-21 DIAGNOSIS — I10 Essential (primary) hypertension: Secondary | ICD-10-CM

## 2015-10-21 LAB — BASIC METABOLIC PANEL
ANION GAP: 8 (ref 5–15)
Anion gap: 10 (ref 5–15)
Anion gap: 7 (ref 5–15)
Anion gap: 7 (ref 5–15)
Anion gap: 8 (ref 5–15)
Anion gap: 9 (ref 5–15)
BUN: 74 mg/dL — ABNORMAL HIGH (ref 6–20)
BUN: 74 mg/dL — ABNORMAL HIGH (ref 6–20)
BUN: 77 mg/dL — AB (ref 6–20)
BUN: 77 mg/dL — AB (ref 6–20)
BUN: 81 mg/dL — AB (ref 6–20)
BUN: 90 mg/dL — AB (ref 6–20)
CALCIUM: 9.6 mg/dL (ref 8.9–10.3)
CHLORIDE: 113 mmol/L — AB (ref 101–111)
CHLORIDE: 116 mmol/L — AB (ref 101–111)
CHLORIDE: 116 mmol/L — AB (ref 101–111)
CO2: 12 mmol/L — ABNORMAL LOW (ref 22–32)
CO2: 13 mmol/L — ABNORMAL LOW (ref 22–32)
CO2: 14 mmol/L — AB (ref 22–32)
CO2: 14 mmol/L — ABNORMAL LOW (ref 22–32)
CO2: 15 mmol/L — AB (ref 22–32)
CO2: 17 mmol/L — AB (ref 22–32)
CREATININE: 2.44 mg/dL — AB (ref 0.61–1.24)
CREATININE: 2.29 mg/dL — AB (ref 0.61–1.24)
CREATININE: 2.22 mg/dL — AB (ref 0.61–1.24)
Calcium: 8.5 mg/dL — ABNORMAL LOW (ref 8.9–10.3)
Calcium: 8.7 mg/dL — ABNORMAL LOW (ref 8.9–10.3)
Calcium: 9.1 mg/dL (ref 8.9–10.3)
Calcium: 9.2 mg/dL (ref 8.9–10.3)
Calcium: 9.6 mg/dL (ref 8.9–10.3)
Chloride: 114 mmol/L — ABNORMAL HIGH (ref 101–111)
Chloride: 117 mmol/L — ABNORMAL HIGH (ref 101–111)
Chloride: 119 mmol/L — ABNORMAL HIGH (ref 101–111)
Creatinine, Ser: 1.93 mg/dL — ABNORMAL HIGH (ref 0.61–1.24)
Creatinine, Ser: 2 mg/dL — ABNORMAL HIGH (ref 0.61–1.24)
Creatinine, Ser: 2.58 mg/dL — ABNORMAL HIGH (ref 0.61–1.24)
GFR calc Af Amer: 29 mL/min — ABNORMAL LOW (ref >60)
GFR calc Af Amer: 31 mL/min — ABNORMAL LOW (ref >60)
GFR calc Af Amer: 32 mL/min — ABNORMAL LOW (ref >60)
GFR calc Af Amer: 38 mL/min — ABNORMAL LOW (ref >60)
GFR calc Af Amer: 37 mL/min — ABNORMAL LOW (ref >60)
GFR calc non Af Amer: 33 mL/min — ABNORMAL LOW (ref >60)
GFR calc non Af Amer: 32 mL/min — ABNORMAL LOW (ref >60)
GFR, EST AFRICAN AMERICAN: 27 mL/min — AB (ref >60)
GFR, EST NON AFRICAN AMERICAN: 23 mL/min — AB (ref >60)
GFR, EST NON AFRICAN AMERICAN: 25 mL/min — AB (ref >60)
GFR, EST NON AFRICAN AMERICAN: 27 mL/min — AB (ref >60)
GFR, EST NON AFRICAN AMERICAN: 28 mL/min — AB (ref >60)
GLUCOSE: 215 mg/dL — AB (ref 65–99)
GLUCOSE: 166 mg/dL — AB (ref 65–99)
Glucose, Bld: 116 mg/dL — ABNORMAL HIGH (ref 65–99)
Glucose, Bld: 139 mg/dL — ABNORMAL HIGH (ref 65–99)
Glucose, Bld: 154 mg/dL — ABNORMAL HIGH (ref 65–99)
Glucose, Bld: 165 mg/dL — ABNORMAL HIGH (ref 65–99)
POTASSIUM: 4 mmol/L (ref 3.5–5.1)
POTASSIUM: 7.3 mmol/L — AB (ref 3.5–5.1)
Potassium: 6.1 mmol/L — ABNORMAL HIGH (ref 3.5–5.1)
Potassium: 5.6 mmol/L — ABNORMAL HIGH (ref 3.5–5.1)
Potassium: 5.6 mmol/L — ABNORMAL HIGH (ref 3.5–5.1)
Potassium: 4.4 mmol/L (ref 3.5–5.1)
SODIUM: 135 mmol/L (ref 135–145)
SODIUM: 137 mmol/L (ref 135–145)
SODIUM: 138 mmol/L (ref 135–145)
SODIUM: 138 mmol/L (ref 135–145)
SODIUM: 140 mmol/L (ref 135–145)
Sodium: 141 mmol/L (ref 135–145)

## 2015-10-21 LAB — COMPREHENSIVE METABOLIC PANEL
ALBUMIN: 4.7 g/dL (ref 3.5–5.0)
ALK PHOS: 84 U/L (ref 38–126)
ALT: 71 U/L — ABNORMAL HIGH (ref 17–63)
ANION GAP: 9 (ref 5–15)
AST: 44 U/L — ABNORMAL HIGH (ref 15–41)
BUN: 83 mg/dL — ABNORMAL HIGH (ref 6–20)
CALCIUM: 9.5 mg/dL (ref 8.9–10.3)
CO2: 13 mmol/L — AB (ref 22–32)
Chloride: 115 mmol/L — ABNORMAL HIGH (ref 101–111)
Creatinine, Ser: 2.51 mg/dL — ABNORMAL HIGH (ref 0.61–1.24)
GFR calc non Af Amer: 24 mL/min — ABNORMAL LOW (ref >60)
GFR, EST AFRICAN AMERICAN: 28 mL/min — AB (ref >60)
GLUCOSE: 162 mg/dL — AB (ref 65–99)
POTASSIUM: 6.1 mmol/L — AB (ref 3.5–5.1)
SODIUM: 137 mmol/L (ref 135–145)
Total Bilirubin: 0.9 mg/dL (ref 0.3–1.2)
Total Protein: 8.7 g/dL — ABNORMAL HIGH (ref 6.5–8.1)

## 2015-10-21 LAB — GLUCOSE, CAPILLARY
GLUCOSE-CAPILLARY: 152 mg/dL — AB (ref 65–99)
GLUCOSE-CAPILLARY: 265 mg/dL — AB (ref 65–99)
GLUCOSE-CAPILLARY: 100 mg/dL — AB (ref 65–99)
Glucose-Capillary: 155 mg/dL — ABNORMAL HIGH (ref 65–99)

## 2015-10-21 LAB — CBC
HEMATOCRIT: 38.5 % — AB (ref 39.0–52.0)
HEMOGLOBIN: 12.6 g/dL — AB (ref 13.0–17.0)
MCH: 27.2 pg (ref 26.0–34.0)
MCHC: 32.7 g/dL (ref 30.0–36.0)
MCV: 83.2 fL (ref 78.0–100.0)
Platelets: 139 10*3/uL — ABNORMAL LOW (ref 150–400)
RBC: 4.63 MIL/uL (ref 4.22–5.81)
RDW: 17.1 % — ABNORMAL HIGH (ref 11.5–15.5)
WBC: 9.1 10*3/uL (ref 4.0–10.5)

## 2015-10-21 LAB — PHOSPHORUS: PHOSPHORUS: 5.2 mg/dL — AB (ref 2.5–4.6)

## 2015-10-21 LAB — RAPID URINE DRUG SCREEN, HOSP PERFORMED
Amphetamines: NOT DETECTED
BENZODIAZEPINES: NOT DETECTED
Barbiturates: NOT DETECTED
COCAINE: NOT DETECTED
OPIATES: POSITIVE — AB
Tetrahydrocannabinol: NOT DETECTED

## 2015-10-21 LAB — TSH: TSH: 5.264 u[IU]/mL — AB (ref 0.350–4.500)

## 2015-10-21 LAB — T4, FREE: Free T4: 0.63 ng/dL (ref 0.61–1.12)

## 2015-10-21 LAB — MRSA PCR SCREENING: MRSA BY PCR: NEGATIVE

## 2015-10-21 LAB — MAGNESIUM: MAGNESIUM: 2.7 mg/dL — AB (ref 1.7–2.4)

## 2015-10-21 LAB — LIPASE, BLOOD: LIPASE: 32 U/L (ref 11–51)

## 2015-10-21 MED ORDER — HYDRALAZINE HCL 50 MG PO TABS
50.0000 mg | ORAL_TABLET | Freq: Three times a day (TID) | ORAL | Status: DC
  Administered 2015-10-21 (×2): 50 mg via ORAL
  Filled 2015-10-21 (×2): qty 1

## 2015-10-21 MED ORDER — SODIUM BICARBONATE 8.4 % IV SOLN
50.0000 meq | Freq: Once | INTRAVENOUS | Status: AC
  Administered 2015-10-21: 50 meq via INTRAVENOUS
  Filled 2015-10-21: qty 50

## 2015-10-21 MED ORDER — LOPERAMIDE HCL 2 MG PO CAPS
2.0000 mg | ORAL_CAPSULE | Freq: Three times a day (TID) | ORAL | Status: DC | PRN
  Administered 2015-10-21: 2 mg via ORAL
  Filled 2015-10-21: qty 1

## 2015-10-21 MED ORDER — CARVEDILOL 12.5 MG PO TABS
12.5000 mg | ORAL_TABLET | Freq: Two times a day (BID) | ORAL | Status: DC
  Administered 2015-10-21 – 2015-10-24 (×6): 12.5 mg via ORAL
  Filled 2015-10-21 (×7): qty 1

## 2015-10-21 MED ORDER — STERILE WATER FOR INJECTION IV SOLN
INTRAVENOUS | Status: DC
  Administered 2015-10-21: 18:00:00 via INTRAVENOUS
  Filled 2015-10-21 (×2): qty 850

## 2015-10-21 MED ORDER — GI COCKTAIL ~~LOC~~
30.0000 mL | Freq: Two times a day (BID) | ORAL | Status: DC | PRN
  Administered 2015-10-21: 30 mL via ORAL
  Filled 2015-10-21: qty 30

## 2015-10-21 MED ORDER — HYDRALAZINE HCL 25 MG PO TABS: 25.0000 mg | ORAL_TABLET | Freq: Three times a day (TID) | ORAL | Status: DC

## 2015-10-21 MED ORDER — SODIUM BICARBONATE 8.4 % IV SOLN
INTRAVENOUS | Status: DC
  Administered 2015-10-21: 14:00:00 via INTRAVENOUS
  Filled 2015-10-21: qty 50

## 2015-10-21 MED ORDER — CLONIDINE HCL 0.1 MG PO TABS
0.1000 mg | ORAL_TABLET | Freq: Every day | ORAL | Status: DC
  Administered 2015-10-22 – 2015-10-23 (×2): 0.1 mg via ORAL
  Filled 2015-10-21 (×3): qty 1

## 2015-10-21 MED ORDER — FAMOTIDINE 20 MG PO TABS
20.0000 mg | ORAL_TABLET | Freq: Two times a day (BID) | ORAL | Status: DC
  Administered 2015-10-21 – 2015-10-24 (×7): 20 mg via ORAL
  Filled 2015-10-21 (×7): qty 1

## 2015-10-21 MED ORDER — ORAL CARE MOUTH RINSE
15.0000 mL | Freq: Two times a day (BID) | OROMUCOSAL | Status: DC
  Administered 2015-10-21 – 2015-10-22 (×2): 15 mL via OROMUCOSAL

## 2015-10-21 MED ORDER — CHOLESTYRAMINE LIGHT 4 G PO PACK
4.0000 g | PACK | Freq: Two times a day (BID) | ORAL | Status: DC
  Administered 2015-10-21 – 2015-10-24 (×6): 4 g via ORAL
  Filled 2015-10-21 (×6): qty 1

## 2015-10-21 NOTE — Progress Notes (Signed)
OT Cancellation Note  Patient Details Name: Douglas Mccoy MRN: [id number] DOB: 1945/08/24   Cancelled Treatment:    Reason Eval/Treat Not Completed: Medical issues which prohibited therapy.  Pt was having orthostatic BP checked by nursing and positive for this. Will check back tomorrow or Monday  Rome Schlauch 10/21/2015, 3:18 PM  Lesle Chris, OTR/L [id number] 10/21/2015

## 2015-10-21 NOTE — Progress Notes (Signed)
CRITICAL VALUE ALERT  Critical value received:  K+ 7.3  Date of notification:  10/21/2015  Time of notification:  0029  Critical value read back:Yes.    Nurse who received alert:  qm  MD notified (1st page):  Walden Field  Time of first page:  0035  MD notified (2nd page):  Time of second page:  Responding MD:  lynch  Time MD responded:  [id number]

## 2015-10-21 NOTE — Progress Notes (Signed)
PROGRESS NOTE  Douglas Mccoy [id number] DOB: [birth year] DOA: 10/20/2015 PCP: [facility]  HPI/Recap of past 24 hours:  bp elevated, potassium and cr better Patient report feeling weak and dizzy when stand up, continued diarrhea which is chronic, no fever,  He report no appetite for the last few weeks  Assessment/Plan: Active Problems:   Acute renal failure (ARF) (HCC)   Hyperkalemia   DM (diabetes mellitus), type 2 ([place])   Hypertension   COPD (chronic obstructive pulmonary disease) ([place])   Multiple myeloma ([place])   70 y.o. male with medical history significant of COPD, DM2 HL, HTN, chronic diarrhea for 8 years  CVA, bone cancer, being admitted for acute on chronic renal failure and hyperkalemia  Present on Admission: . Acute renal failure (ARF) (HCC) - possible component of CKD, no prior lab available,  dehydration secondary to decreased by mouth intake also contribute to cr elevation, ua no infection,  renal US no hydronephrosis lisinopril held since admission, renal dosing meds, repeat bmp  . Hyperkalemia- k 7.5 on admission, though no significant EKG changes, Patient has been treated with albuterol, calcium, insulin glucose, has ordered kayexalate Stop potassium supplementation and lisinopril k down to 5.6, but remain at 5.6 with acidosis, will start bicarb ivf , repeat labs  . Hypertension , uncontrolled, will increase Coreg, start hydralazine, change clonidine from twice a day to once a day at night ( he has tendency to be bradycardia), monitor bp and heart rate, continue titrate bp meds.  Elevation of lft:  hepatitis panel pending, [place] + fatty liver, otherwise unremarkable, repeat labs in am, he is also on statin  Elevated tsh: free t4 wnl  . COPD (chronic obstructive pulmonary disease) ([employer]) stable continue to monitor  . h/o Multiple myeloma ([place]) s/p bone marrow transplant at [facility]. patient states currently in remission ,followed by [facility], hgb  stable, ca wnl. No bone pain  Coronary artery disease , CAD sp CABG with perioperative CVA in 2004, currently stable, no chest pain ,no acute EKG changes, continue aspirin, coreg, statin  History CVA currently stable continue aspirin/statin  History of seizure disorder - continue Keppra  Impaired memory at baseline, know the year/month, know he is in the hospital, not able to name the hospital.  H/o multiple myeloma    Chronic diarrhea, report since after BMT.  Homeless: Education officer, museum consulted  DVT prophylaxis while in the hospital:    Lovenox low dose    Code Status:  FULL CODE  as per patient    Family Communication:   Family not  at  Bedside   Disposition Plan:  Will need housing adressed                        Would benefit from PT/OT eval prior to DC  ordered                       Social Work  will evaluate given homelessness Nutrition  consulted                          Consults called: none      Procedures:  none  Antibiotics:  none   Objective: BP (!) 191/91   Pulse 82   Temp 97.9 F (36.6 C) (Oral)   Resp 20   Ht 5' 5"  (1.651 m)   Wt 66.8 kg (147 lb 4.3 oz)   SpO2  96%   BMI 24.51 kg/m   Intake/Output Summary (Last 24 hours) at 10/21/15 0905 Last data filed at 10/21/15 0600  Gross per 24 hour  Intake          1137.08 ml  Output             1800 ml  Net          -662.92 ml   Filed Weights   10/20/15 1455 10/20/15 2326  Weight: 65.8 kg (145 lb) 66.8 kg (147 lb 4.3 oz)    Exam:   General:  NAD, frail,   Cardiovascular: RRR, healed mild line scar from prior CABG, + murmur  Respiratory: CTABL  Abdomen: Soft/ND/NT, positive BS  Musculoskeletal: No Edema  Neuro: alert and oriented, slight memory impairment  Data Reviewed: Basic Metabolic Panel:  Recent Labs Lab 10/20/15 1919 10/20/15 2029 10/20/15 2349 10/21/15 0402 10/21/15 0807  NA 133* 133* 135 137  138 137  K >7.5* >7.5* 7.3* 6.1*  6.1* 5.6*  CL 113* 110 113*  115*  114* 117*  CO2 13* 16* 14* 13*  14* 13*  GLUCOSE 178* 165* 116* 162*  165* 154*  BUN 92* 90* 90* 83*  81* 77*  CREATININE 2.87* 2.71* 2.58* 2.51*  2.44* 2.29*  CALCIUM 9.2 9.6 9.6 9.5  9.6 9.2  MG  --   --   --  2.7*  --   PHOS  --   --   --  5.2*  --    Liver Function Tests:  Recent Labs Lab 10/20/15 1919 10/21/15 0402  AST 56* 44*  ALT 78* 71*  ALKPHOS 87 84  BILITOT 0.7 0.9  PROT 9.0* 8.7*  ALBUMIN 4.8 4.7    Recent Labs Lab 10/20/15 1919  LIPASE 36   No results for input(s): AMMONIA in the last 168 hours. CBC:  Recent Labs Lab 10/20/15 1612 10/20/15 2210 10/21/15 0402  WBC 12.2* 9.4 9.1  NEUTROABS  --  7.3  --   HGB 13.1 12.3* 12.6*  HCT 40.7 36.0* 38.5*  MCV 84.4 80.5 83.2  PLT 152 144* 139*   Cardiac Enzymes:   No results for input(s): CKTOTAL, CKMB, CKMBINDEX, TROPONINI in the last 168 hours. BNP (last 3 results) No results for input(s): BNP in the last 8760 hours.  ProBNP (last 3 results) No results for input(s): PROBNP in the last 8760 hours.  CBG:  Recent Labs Lab 10/20/15 1502 10/20/15 2040 10/20/15 2124 10/20/15 2352 10/21/15 0810  GLUCAP 223* 152* 191* 106* 152*    Recent Results (from the past 240 hour(s))  MRSA PCR Screening     Status: None   Collection Time: 10/21/15 12:06 AM  Result Value Ref Range Status   MRSA by PCR NEGATIVE NEGATIVE Final    Comment:        The GeneXpert MRSA Assay (FDA approved for NASAL specimens only), is one component of a comprehensive MRSA colonization surveillance program. It is not intended to diagnose MRSA infection nor to guide or monitor treatment for MRSA infections.      Studies: No results found.  Scheduled Meds: . aspirin  325 mg Oral Daily  . carvedilol  12.5 mg Oral BID WC  . [START ON 10/22/2015] cloNIDine  0.1 mg Oral QHS  . enoxaparin (LOVENOX) injection  30 mg Subcutaneous Q24H  . hydrALAZINE  50 mg Oral Q8H  . insulin aspart  0-5 Units Subcutaneous QHS  .  insulin aspart  0-9 Units Subcutaneous TID WC  .  levETIRAcetam  500 mg Oral BID  . levothyroxine  25 mcg Oral QHS  . mouth rinse  15 mL Mouth Rinse BID  . sertraline  50 mg Oral Daily  . simvastatin  10 mg Oral QHS  . sodium chloride flush  3 mL Intravenous Q12H    Continuous Infusions: . sodium chloride 125 mL/hr at 10/20/15 2347     Time spent: 72mns  Maurizio Geno MD, PhD  [facility] Pager [phone] If 7PM-7AM, please contact night-coverage at [email or web address], password [email or web address]10/07/2015, 9:05 AM  LOS: 1 day

## 2015-10-22 DIAGNOSIS — E440 Moderate protein-calorie malnutrition: Secondary | ICD-10-CM

## 2015-10-22 DIAGNOSIS — K529 Noninfective gastroenteritis and colitis, unspecified: Secondary | ICD-10-CM

## 2015-10-22 DIAGNOSIS — E872 Acidosis: Secondary | ICD-10-CM

## 2015-10-22 LAB — COMPREHENSIVE METABOLIC PANEL
ALT: 38 U/L (ref 17–63)
ANION GAP: 8 (ref 5–15)
AST: 23 U/L (ref 15–41)
Albumin: 3.8 g/dL (ref 3.5–5.0)
Alkaline Phosphatase: 64 U/L (ref 38–126)
BUN: 70 mg/dL — ABNORMAL HIGH (ref 6–20)
CHLORIDE: 114 mmol/L — AB (ref 101–111)
CO2: 19 mmol/L — AB (ref 22–32)
Calcium: 8.4 mg/dL — ABNORMAL LOW (ref 8.9–10.3)
Creatinine, Ser: 1.83 mg/dL — ABNORMAL HIGH (ref 0.61–1.24)
GFR, EST AFRICAN AMERICAN: 41 mL/min — AB (ref >60)
GFR, EST NON AFRICAN AMERICAN: 35 mL/min — AB (ref >60)
Glucose, Bld: 98 mg/dL (ref 65–99)
POTASSIUM: 4.2 mmol/L (ref 3.5–5.1)
SODIUM: 141 mmol/L (ref 135–145)
Total Bilirubin: 0.8 mg/dL (ref 0.3–1.2)
Total Protein: 6.9 g/dL (ref 6.5–8.1)

## 2015-10-22 LAB — CBC
HCT: 31.3 % — ABNORMAL LOW (ref 39.0–52.0)
Hemoglobin: 10.4 g/dL — ABNORMAL LOW (ref 13.0–17.0)
MCH: 27.5 pg (ref 26.0–34.0)
MCHC: 33.2 g/dL (ref 30.0–36.0)
MCV: 82.8 fL (ref 78.0–100.0)
PLATELETS: 134 10*3/uL — AB (ref 150–400)
RBC: 3.78 MIL/uL — AB (ref 4.22–5.81)
RDW: 17.1 % — AB (ref 11.5–15.5)
WBC: 7.2 10*3/uL (ref 4.0–10.5)

## 2015-10-22 LAB — HEMOGLOBIN A1C
Hgb A1c MFr Bld: 7.1 % — ABNORMAL HIGH (ref 4.8–5.6)
MEAN PLASMA GLUCOSE: 157 mg/dL

## 2015-10-22 LAB — T3, FREE: T3, Free: 1.8 pg/mL — ABNORMAL LOW (ref 2.0–4.4)

## 2015-10-22 LAB — GLUCOSE, CAPILLARY
Glucose-Capillary: 102 mg/dL — ABNORMAL HIGH (ref 65–99)
Glucose-Capillary: 103 mg/dL — ABNORMAL HIGH (ref 65–99)
Glucose-Capillary: 177 mg/dL — ABNORMAL HIGH (ref 65–99)
Glucose-Capillary: 188 mg/dL — ABNORMAL HIGH (ref 65–99)
Glucose-Capillary: 197 mg/dL — ABNORMAL HIGH (ref 65–99)
Glucose-Capillary: 98 mg/dL (ref 65–99)

## 2015-10-22 LAB — HEPATITIS PANEL, ACUTE
HEP A IGM: NEGATIVE
HEP B C IGM: NEGATIVE
Hepatitis B Surface Ag: NEGATIVE

## 2015-10-22 MED ORDER — SODIUM BICARBONATE 650 MG PO TABS
1300.0000 mg | ORAL_TABLET | Freq: Two times a day (BID) | ORAL | Status: DC
  Administered 2015-10-22 (×2): 1300 mg via ORAL
  Filled 2015-10-22 (×2): qty 2

## 2015-10-22 MED ORDER — NEPRO/CARBSTEADY PO LIQD
237.0000 mL | ORAL | Status: DC
  Administered 2015-10-22 – 2015-10-23 (×2): 237 mL via ORAL
  Filled 2015-10-22 (×4): qty 237

## 2015-10-22 MED ORDER — LEVOTHYROXINE SODIUM 50 MCG PO TABS
50.0000 ug | ORAL_TABLET | Freq: Every day | ORAL | Status: DC
  Administered 2015-10-22 – 2015-10-23 (×2): 50 ug via ORAL
  Filled 2015-10-22 (×2): qty 1

## 2015-10-22 NOTE — Progress Notes (Addendum)
Physical Therapy Evaluation Patient Details Name: Douglas Mccoy MRN: [id number] DOB: 1945-03-28 Today's Date: 10/22/2015   History of Present Illness  pt was admitted for lightheadedness.  PMH significant for COPD, CM, HTN, CVA x 2, CAD, multiple myeloma and seizures  Clinical Impression  The patient ambulated x 33' with RW. Patient's DC plan disposition is unknown. Pt admitted with above diagnosis. Pt currently with functional limitations due to the deficits listed below (see PT Problem List).  Pt will benefit from skilled PT to increase their independence and safety with mobility to allow discharge to the venue listed below.   Patient's HR 77 and sats >96% on RA.     Follow Up Recommendations Supervision/Assistance - 24 hour;SNF (uncertain)    Equipment Recommendations  None recommended by PT    Recommendations for Other Services       Precautions / Restrictions Precautions Precautions: Fall Precaution Comments: check BP Restrictions Weight Bearing Restrictions: No      Mobility  Bed Mobility Overal bed mobility: Needs Assistance Bed Mobility: Supine to Sit     Supine to sit: Min guard     General bed mobility comments: for safety  Transfers Overall transfer level: Needs assistance Equipment used: Rolling walker (2 wheeled) Transfers: Sit to/from Stand Sit to Stand: Min assist         General transfer comment: assistance to stabilize. Cues for UE placement  Ambulation/Gait Ambulation/Gait assistance: Min assist Ambulation Distance (Feet): 60 Feet Assistive device: Rolling walker (2 wheeled) Gait Pattern/deviations: Step-through pattern     General Gait Details: the patient  is listing to the left initially  Stairs            Wheelchair Mobility    Modified Rankin (Stroke Patients Only)       Balance Overall balance assessment: Needs assistance           Standing balance-Leahy Scale: Poor Standing balance comment: leaning to L.   reliance on RW                             Pertinent Vitals/Pain Pain Assessment: No/denies pain    Home Living Family/patient expects to be discharged to:: Shelter/Homeless                      Prior Function Level of Independence: Independent with assistive device(s)         Comments: started using SPC last week     Hand Dominance        Extremity/Trunk Assessment   Upper Extremity Assessment: Overall WFL for tasks assessed                     Communication   Communication: No difficulties  Cognition Arousal/Alertness: Awake/alert Behavior During Therapy: WFL for tasks assessed/performed Overall Cognitive Status: Within Functional Limits for tasks assessed                      General Comments      Exercises     Assessment/Plan    PT Assessment Patient needs continued PT services  PT Problem List Decreased strength;Decreased range of motion;Decreased activity tolerance;Decreased mobility;Cardiopulmonary status limiting activity;Decreased knowledge of precautions;Decreased safety awareness;Decreased knowledge of use of DME          PT Treatment Interventions DME instruction;Gait training;Functional mobility training;Therapeutic activities;Therapeutic exercise;Patient/family education    PT Goals (Current goals can be found in the Care  Plan section)  Acute Rehab PT Goals Patient Stated Goal: get strength back PT Goal Formulation: With patient Time For Goal Achievement: 11/05/15 Potential to Achieve Goals: Good    Frequency Min 3X/week   Barriers to discharge Decreased caregiver support      Co-evaluation PT/OT/SLP Co-Evaluation/Treatment: Yes Reason for Co-Treatment: For patient/therapist safety PT goals addressed during session: Mobility/safety with mobility OT goals addressed during session: ADL's and self-care       End of Session Equipment Utilized During Treatment: Gait belt Activity Tolerance: Patient  tolerated treatment well Patient left: in chair;with call bell/phone within reach;with family/visitor present;with chair alarm set Nurse Communication: Patient requests pain meds         Time: 8527-7824 PT Time Calculation (min) (ACUTE ONLY): 16 min   Charges:   PT Evaluation $PT Eval Low Complexity: 1 Procedure     PT G CodesClaretha Cooper 10/22/2015, 12:56 PM

## 2015-10-22 NOTE — Evaluation (Addendum)
Occupational Therapy Evaluation Patient Details Name: Douglas Mccoy MRN: [id number] DOB: 02/16/1945 Today's Date: 10/22/2015    History of Present Illness pt was admitted for lightheadedness.  PMH significant for COPD, CM, HTN, CVA x 2, CAD, multiple myeloma and seizures   Clinical Impression   This 70 year old man was admitted for the above.  He was independent/mod I with adls prior to admission, and he lives in a shelter. He will benefit from continued OT to increase safety and independence with adls.  Goals in acute are for supervision level.    Follow Up Recommendations  SNF    Equipment Recommendations  None recommended by OT    Recommendations for Other Services       Precautions / Restrictions Precautions Precautions: Fall Precaution Comments: check BP Restrictions Weight Bearing Restrictions: No      Mobility Bed Mobility Overal bed mobility: Needs Assistance Bed Mobility: Supine to Sit     Supine to sit: Min guard     General bed mobility comments: for safety  Transfers Overall transfer level: Needs assistance Equipment used: Rolling walker (2 wheeled) Transfers: Sit to/from Stand Sit to Stand: Min assist         General transfer comment: assistance to stabilize. Cues for UE placement    Balance Overall balance assessment: Needs assistance           Standing balance-Leahy Scale: Poor Standing balance comment: leaning to L.  reliance on RW                            ADL Overall ADL's : Needs assistance/impaired     Grooming: Set up;Sitting   Upper Body Bathing: Set up;Sitting   Lower Body Bathing: Minimal assistance;Sit to/from stand   Upper Body Dressing : Minimal assistance;Sitting   Lower Body Dressing: Minimal assistance;Sit to/from stand   Toilet Transfer: Minimal assistance;Ambulation;RW (chair)             General ADL Comments: pt unsteady; L lean at times, +2 for safety.  Pt stated he felt lightheaded.   Sat and BP was WFLs.  145/62, 136/60 and 140/.  Pt also c/o dyspnea 2/4. Cued for pursed lip breathing     Vision     Perception     Praxis      Pertinent Vitals/Pain Pain Assessment: No/denies pain     Hand Dominance     Extremity/Trunk Assessment Upper Extremity Assessment Upper Extremity Assessment: Overall WFL for tasks assessed           Communication Communication Communication: No difficulties   Cognition Arousal/Alertness: Awake/alert Behavior During Therapy: WFL for tasks assessed/performed Overall Cognitive Status: Within Functional Limits for tasks assessed                     General Comments       Exercises       Shoulder Instructions      Home Living Family/patient expects to be discharged to:: Shelter/Homeless                                        Prior Functioning/Environment Level of Independence: Independent with assistive device(s)        Comments: started using SPC last week        OT Problem List: Decreased strength;Decreased activity tolerance;Impaired balance (sitting and/or standing);Decreased  knowledge of use of DME or AE;   OT Treatment/Interventions: Self-care/ADL training;DME and/or AE instruction;Patient/family education;Balance training;energy conservation    OT Goals(Current goals can be found in the care plan section) Acute Rehab OT Goals Patient Stated Goal: get strength back OT Goal Formulation: With patient Time For Goal Achievement: 11/05/15 Potential to Achieve Goals: Good ADL Goals Pt Will Perform Grooming: with supervision;standing Pt Will Perform Lower Body Bathing: with supervision;sit to/from stand Pt Will Perform Lower Body Dressing: with supervision;sit to/from stand Pt Will Transfer to Toilet: with supervision;ambulating;bedside commode Pt Will Perform Toileting - Clothing Manipulation and hygiene: with supervision;sit to/from stand  OT Frequency: Min 2X/week   Barriers to  D/C:            Co-evaluation PT/OT/SLP Co-Evaluation/Treatment: Yes Reason for Co-Treatment: For patient/therapist safety PT goals addressed during session: Mobility/safety with mobility OT goals addressed during session: ADL's and self-care      End of Session    Activity Tolerance: Patient limited by fatigue Patient left: in chair;with call bell/phone within reach;with chair alarm set   Time: 5072-2575 OT Time Calculation (min): 19 min Charges:  OT General Charges $OT Visit: 1 Procedure OT Evaluation $OT Eval Moderate Complexity: 1 Procedure G-Codes:    Kimyata Milich November 19, 2015, 11:29 AM Lesle Chris, OTR/L [phone] Nov 19, 2015

## 2015-10-22 NOTE — Progress Notes (Signed)
PROGRESS NOTE  Douglas Mccoy BZJ:[id number] DOB: 11/17/1945 DOA: 10/20/2015 PCP: [facility]  HPI/Recap of past 24 hours:   Patient report feeling better, diarrhea has slowed down, appetite is better, denies pain currently  no fever,   Assessment/Plan: Active Problems:   Acute renal failure (ARF) (HCC)   Hyperkalemia   DM (diabetes mellitus), type 2 ([place])   Hypertension   COPD (chronic obstructive pulmonary disease) ([place])   Multiple myeloma ([place])   70 y.o. male with medical history significant of COPD, DM2 HL, HTN, chronic diarrhea for 8 years  CVA, bone cancer, being admitted for acute on chronic renal failure and hyperkalemia  Present on Admission: . Acute renal failure (ARF) (HCC) - possible component of CKD, no prior lab available,  dehydration secondary to decreased by mouth intake also contribute to cr elevation, ua no infection,  renal US no hydronephrosis lisinopril held since admission, renal dosing meds,  Cr improving  . Hyperkalemia- k 7.5 on admission, though no significant EKG changes, Patient has been treated with albuterol, calcium, insulin glucose, has ordered kayexalate Stop potassium supplementation and lisinopril k down to 5.6, but remain at 5.6 with acidosis,  started bicarb ivf ,  Potassium normalized  Metabolic acidosis, improving, change iv bicarb to oral   . Hypertension ,  Currently on increased dose coreg, and decreased does of clonidine,  bp better, heart rate better, bradycardia has resolved continue titrate bp meds.  Elevation of lft:  hepatitis panel pending, [place] + fatty liver, otherwise unremarkable, lft normalized  he is also on statin  hypothyroidism: increase synthroid  . COPD (chronic obstructive pulmonary disease) ([place]) stable continue to monitor  . h/o Multiple myeloma ([place]) s/p bone marrow transplant at [facility]. patient states currently in remission ,followed by [facility], hgb stable, ca wnl. No bone  pain  Coronary artery disease , CAD sp CABG with perioperative CVA in 2004, currently stable, no chest pain ,no acute EKG changes, continue aspirin, coreg, statin  History CVA currently stable continue aspirin/statin  History of seizure disorder - continue Keppra  Impaired memory at baseline, know the year/month, know he is in the hospital, not able to name the hospital.  Chronic diarrhea, report since after BMT. Better.  Non-severe (moderate) malnutrition in context of acute illness/injury: nutrition input appreciated  Homeless: social worker consulted  DVT prophylaxis while in the hospital:    Lovenox low dose    Code Status:  FULL CODE  as per patient    Family Communication:   Family not  at  Bedside   Disposition Plan:                         out of stepdown to med tele, start PT/OT                       Social Work  will evaluate given homelessness Nutrition  consulted, Will need housing adressed vs snf Anticipate d/c on 10/9 if social issues get sort out                           Consults called: none      Procedures:  none  Antibiotics:  none   Objective: BP (!) 129/55   Pulse 78   Temp 97.6 F (36.4 C)   Resp 15   Ht 5' 5"  (1.651 m)   Wt 66.8 kg (147 lb 4.3 oz)  SpO2 96%   BMI 24.51 kg/m   Intake/Output Summary (Last 24 hours) at 10/22/15 0801 Last data filed at 10/22/15 0600  Gross per 24 hour  Intake             1335 ml  Output                0 ml  Net             1335 ml   Filed Weights   10/20/15 1455 10/20/15 2326  Weight: 65.8 kg (145 lb) 66.8 kg (147 lb 4.3 oz)    Exam:   General:  NAD, frail,   Cardiovascular: RRR, healed mild line scar from prior CABG, + murmur  Respiratory: CTABL  Abdomen: Soft/ND/NT, positive BS  Musculoskeletal: No Edema  Neuro: alert and oriented, slight memory impairment  Data Reviewed: Basic Metabolic Panel:  Recent Labs Lab 10/21/15 0402 10/21/15 0807 10/21/15 0928 10/21/15 1558  10/21/15 1846 10/22/15 0343  NA 137  138 137 138 140 141 141  K 6.1*  6.1* 5.6* 5.6* 4.4 4.0 4.2  CL 115*  114* 117* 119* 116* 116* 114*  CO2 13*  14* 13* 12* 15* 17* 19*  GLUCOSE 162*  165* 154* 139* 215* 166* 98  BUN 83*  81* 77* 77* 74* 74* 70*  CREATININE 2.51*  2.44* 2.29* 2.22* 1.93* 2.00* 1.83*  CALCIUM 9.5  9.6 9.2 9.1 8.7* 8.5* 8.4*  MG 2.7*  --   --   --   --   --   PHOS 5.2*  --   --   --   --   --    Liver Function Tests:  Recent Labs Lab 10/20/15 1919 10/21/15 0402 10/22/15 0343  AST 56* 44* 23  ALT 78* 71* 38  ALKPHOS 87 84 64  BILITOT 0.7 0.9 0.8  PROT 9.0* 8.7* 6.9  ALBUMIN 4.8 4.7 3.8    Recent Labs Lab 10/20/15 1919 10/21/15 0928  LIPASE 36 32   No results for input(s): AMMONIA in the last 168 hours. CBC:  Recent Labs Lab 10/20/15 1612 10/20/15 2210 10/21/15 0402 10/22/15 0343  WBC 12.2* 9.4 9.1 7.2  NEUTROABS  --  7.3  --   --   HGB 13.1 12.3* 12.6* 10.4*  HCT 40.7 36.0* 38.5* 31.3*  MCV 84.4 80.5 83.2 82.8  PLT 152 144* 139* 134*   Cardiac Enzymes:   No results for input(s): CKTOTAL, CKMB, CKMBINDEX, TROPONINI in the last 168 hours. BNP (last 3 results) No results for input(s): BNP in the last 8760 hours.  ProBNP (last 3 results) No results for input(s): PROBNP in the last 8760 hours.  CBG:  Recent Labs Lab 10/21/15 1201 10/21/15 1706 10/21/15 2025 10/22/15 0024 10/22/15 0423  GLUCAP 155* 265* 100* 98 103*    Recent Results (from the past 240 hour(s))  MRSA PCR Screening     Status: None   Collection Time: 10/21/15 12:06 AM  Result Value Ref Range Status   MRSA by PCR NEGATIVE NEGATIVE Final    Comment:        The GeneXpert MRSA Assay (FDA approved for NASAL specimens only), is one component of a comprehensive MRSA colonization surveillance program. It is not intended to diagnose MRSA infection nor to guide or monitor treatment for MRSA infections.      Studies: US Abdomen Complete  Result Date:  10/21/2015 CLINICAL DATA:  Elevated liver transaminases and renal insufficiency. EXAM: ABDOMEN ULTRASOUND COMPLETE COMPARISON:  None. FINDINGS:  Gallbladder: Surgically absent. Common bile duct: Diameter: Normal caliber of 4 mm. Liver: The liver demonstrates coarse echotexture and increased echogenicity, likely reflecting diffuse steatosis. No overt cirrhotic contour abnormalities or focal lesions are identified. There is no evidence of intrahepatic biliary ductal dilatation. The portal vein is open. IVC: No abnormality visualized. Pancreas: Visualized portion unremarkable. Spleen: Mild splenomegaly with estimated splenic volume of 466 mL. Right Kidney: Length: 11.1 cm. Mild cortical atrophy and mildly increased cortical echogenicity, likely reflecting chronic disease. No hydronephrosis or focal lesion. Left Kidney: Length: 10.9 cm. Cortical atrophy without evidence of hydronephrosis or focal lesion. Abdominal aorta: No aneurysm visualized. Other findings: None. IMPRESSION: 1. Diffuse hepatic steatosis without overt cirrhosis by ultrasound. No focal liver lesions identified. 2. Mild splenomegaly. 3. Cortical atrophy of both kidneys without evidence of hydronephrosis. Electronically Signed   By: Aletta Edouard M.D.   On: 10/21/2015 15:03    Scheduled Meds: . aspirin  325 mg Oral Daily  . carvedilol  12.5 mg Oral BID WC  . cholestyramine light  4 g Oral BID  . cloNIDine  0.1 mg Oral QHS  . enoxaparin (LOVENOX) injection  30 mg Subcutaneous Q24H  . famotidine  20 mg Oral BID  . insulin aspart  0-5 Units Subcutaneous QHS  . insulin aspart  0-9 Units Subcutaneous TID WC  . levETIRAcetam  500 mg Oral BID  . levothyroxine  25 mcg Oral QHS  . mouth rinse  15 mL Mouth Rinse BID  . sertraline  50 mg Oral Daily  . simvastatin  10 mg Oral QHS  . sodium chloride flush  3 mL Intravenous Q12H    Continuous Infusions: .  sodium bicarbonate 150 mEq in sterile water 1000 mL infusion 75 mL/hr at 10/21/15 1828      Time spent: 1mns  Sebastien Jackson MD, PhD  [facility] Pager [phone] If 7PM-7AM, please contact night-coverage at [email or web address], password [facility]10/08/2015, 8:01 AM  LOS: 2 days

## 2015-10-22 NOTE — Progress Notes (Signed)
Initial Nutrition Assessment  DOCUMENTATION CODES:   Non-severe (moderate) malnutrition in context of acute illness/injury  INTERVENTION:  Nepro Shake po once daily, each supplement provides 425 kcal and 19 grams protein. Chose this since unsure if renal failure has chronic component yet and because low in potassium, sodium, and phosphorus. Can change if renal failure is only acute.   RD will continue to monitor intake and assess for nutrition-related needs. Patient will likely need education on renal diet if it is determined the renal failure is chronic.  NUTRITION DIAGNOSIS:   Inadequate oral intake related to poor appetite, other (see comment) (early satiety) as evidenced by per patient/family report.  GOAL:   Patient will meet greater than or equal to 90% of their needs  MONITOR:   PO intake, Supplement acceptance, Labs, Weight trends, I & O's  REASON FOR ASSESSMENT:   Consult Assessment of nutrition requirement/status  ASSESSMENT:   70 y.o. male with medical history significant of COPD, DM2, HL, HTN, chronic diarrhea for 8 years, CVA x2 2013, CAD, multiple myeloma (s/p BMT 2013, not currently on chemo) and seizure disorder. Presented with generalized fatigue and lower back pain. Recently his blood sugar has been under poor control he has had some episodes of high blood sugar. Found to have ARF (with possible component of CKD) and hyperkalemia.   Patient reports he is homeless and was living in the homeless [facility]. He reports he can usually get 3 meals per day and also has a snack before bedtime (usual intake below). He reports his appetite has been terrible the past 2 weeks. He was experiencing early satiety and could not finish his meals (<75% of usual intake). He is hungry now and had ordered breakfast already. Denies nausea/vomiting. Endorses difficulty chewing hard foods since he has lost his bottom dentures. Reports chronic diarrhea for 8 years (some days up to 6-7  times per day). UBW 145 lbs with no recent weight loss. Over the past 2 years he has slowly been losing weight, reports he was 160 lbs 2 years ago. About 5 years ago or more he used to weigh 180 lbs. Weight loss is not significant for time frame.  Typical Intake: Breakfast - cereal with milk or scrambled eggs with ham or bacon Lunch - pot roast or chicken with vegetables and bread Dinner - same as lunch Snack (before bed) - peanut butter crackers   Medications reviewed and include: famotidine, Novolog sliding scale TID with meals and daily at bedtime, sodium bicarbonate 150 mEq in sterile water 75 ml/hr.  Labs reviewed: CBG 98-265 past 24 hrs, Chloride 114, CO2 19, Bun 70, Creatinine 1.83, Potassium WNL now.  Nutrition-Focused physical exam completed. Findings are no fat depletion, moderate muscle depletion, and no edema.   Patient meets criteria for moderate acute malnutrition due to intake <75% for >7 days, and nutrition-focused physical findings of moderate muscle depletion.   Discussed plan with RN. Patient is very frustrated with diet restrictions.   Diet Order:  Diet renal/carb modified with fluid restriction Diet-HS Snack? Nothing; Room service appropriate? Yes; Fluid consistency: Thin  Skin:  Reviewed, no issues  Last BM:  10/21/2015  Height:   Ht Readings from Last 1 Encounters:  10/20/15 5' 5"  (1.651 m)    Weight:   Wt Readings from Last 1 Encounters:  10/20/15 147 lb 4.3 oz (66.8 kg)    Ideal Body Weight:  61.82 kg  BMI:  Body mass index is 24.51 kg/m.  Estimated Nutritional Needs:  Kcal:  1670-2000  Protein:  70-80 grams  Fluid:  1.7-2 L/day or per MD in setting of ARF  EDUCATION NEEDS:   No education needs identified at this time  Willey Blade, MS, RD, LDN Pager: [phone] After Hours Pager: [phone]

## 2015-10-23 LAB — CBC
HEMATOCRIT: 29.7 % — AB (ref 39.0–52.0)
HEMOGLOBIN: 9.9 g/dL — AB (ref 13.0–17.0)
MCH: 27.6 pg (ref 26.0–34.0)
MCHC: 33.3 g/dL (ref 30.0–36.0)
MCV: 82.7 fL (ref 78.0–100.0)
Platelets: 124 10*3/uL — ABNORMAL LOW (ref 150–400)
RBC: 3.59 MIL/uL — ABNORMAL LOW (ref 4.22–5.81)
RDW: 16.6 % — AB (ref 11.5–15.5)
WBC: 3.6 10*3/uL — ABNORMAL LOW (ref 4.0–10.5)

## 2015-10-23 LAB — BASIC METABOLIC PANEL
Anion gap: 7 (ref 5–15)
BUN: 42 mg/dL — ABNORMAL HIGH (ref 6–20)
CALCIUM: 8.5 mg/dL — AB (ref 8.9–10.3)
CHLORIDE: 108 mmol/L (ref 101–111)
CO2: 26 mmol/L (ref 22–32)
CREATININE: 1.37 mg/dL — AB (ref 0.61–1.24)
GFR calc non Af Amer: 50 mL/min — ABNORMAL LOW (ref >60)
GFR, EST AFRICAN AMERICAN: 58 mL/min — AB (ref >60)
GLUCOSE: 134 mg/dL — AB (ref 65–99)
Potassium: 3.8 mmol/L (ref 3.5–5.1)
Sodium: 141 mmol/L (ref 135–145)

## 2015-10-23 LAB — GLUCOSE, CAPILLARY
GLUCOSE-CAPILLARY: 128 mg/dL — AB (ref 65–99)
GLUCOSE-CAPILLARY: 183 mg/dL — AB (ref 65–99)
Glucose-Capillary: 212 mg/dL — ABNORMAL HIGH (ref 65–99)
Glucose-Capillary: 168 mg/dL — ABNORMAL HIGH (ref 65–99)

## 2015-10-23 MED ORDER — SODIUM CHLORIDE 0.9 % IV BOLUS (SEPSIS)
1000.0000 mL | Freq: Once | INTRAVENOUS | Status: AC
  Administered 2015-10-23: 1000 mL via INTRAVENOUS

## 2015-10-23 MED ORDER — CHOLESTYRAMINE LIGHT 4 G PO PACK: 4.0000 g | PACK | Freq: Every day | ORAL | 0 refills | Status: DC

## 2015-10-23 MED ORDER — LOPERAMIDE HCL 2 MG PO CAPS: 2.0000 mg | ORAL_CAPSULE | Freq: Three times a day (TID) | ORAL | 0 refills | Status: DC | PRN

## 2015-10-23 MED ORDER — CLONIDINE HCL 0.1 MG PO TABS: 0.1000 mg | ORAL_TABLET | Freq: Every day | ORAL | 0 refills | Status: DC

## 2015-10-23 MED ORDER — LEVOTHYROXINE SODIUM 50 MCG PO TABS: 50.0000 ug | ORAL_TABLET | Freq: Every day | ORAL | 0 refills | Status: DC

## 2015-10-23 MED ORDER — LISINOPRIL 2.5 MG PO TABS
2.5000 mg | ORAL_TABLET | Freq: Every day | ORAL | Status: DC
  Administered 2015-10-24: 2.5 mg via ORAL
  Filled 2015-10-23: qty 1

## 2015-10-23 MED ORDER — FAMOTIDINE 20 MG PO TABS: 20.0000 mg | ORAL_TABLET | Freq: Every day | ORAL | 0 refills | Status: DC

## 2015-10-23 MED ORDER — AMLODIPINE BESYLATE 2.5 MG PO TABS: 2.5000 mg | ORAL_TABLET | Freq: Every day | ORAL | 0 refills | Status: DC

## 2015-10-23 MED ORDER — SODIUM BICARBONATE 650 MG PO TABS: 650.0000 mg | ORAL_TABLET | Freq: Every day | ORAL | 0 refills | Status: DC

## 2015-10-23 MED ORDER — LISINOPRIL 2.5 MG PO TABS: 2.5000 mg | ORAL_TABLET | Freq: Every day | ORAL | 0 refills | Status: DC

## 2015-10-23 MED ORDER — CARVEDILOL 12.5 MG PO TABS: 12.5000 mg | ORAL_TABLET | Freq: Two times a day (BID) | ORAL | 0 refills | Status: DC

## 2015-10-23 MED ORDER — HYDROCODONE-ACETAMINOPHEN 5-325 MG PO TABS: 1.0000 | ORAL_TABLET | Freq: Four times a day (QID) | ORAL | 0 refills | Status: DC | PRN

## 2015-10-23 MED ORDER — HYDRALAZINE HCL 20 MG/ML IJ SOLN: 10.0000 mg | Freq: Four times a day (QID) | INTRAMUSCULAR | Status: DC | PRN

## 2015-10-23 MED ORDER — SODIUM BICARBONATE 650 MG PO TABS
650.0000 mg | ORAL_TABLET | Freq: Every day | ORAL | Status: DC
  Administered 2015-10-23 – 2015-10-24 (×2): 650 mg via ORAL
  Filled 2015-10-23 (×2): qty 1

## 2015-10-23 MED ORDER — INSULIN ASPART 100 UNIT/ML ~~LOC~~ SOLN: SUBCUTANEOUS | 0 refills | Status: DC

## 2015-10-23 MED ORDER — CLONIDINE HCL 0.1 MG PO TABS
0.2000 mg | ORAL_TABLET | Freq: Once | ORAL | Status: AC
  Administered 2015-10-23: 0.2 mg via ORAL
  Filled 2015-10-23: qty 2

## 2015-10-23 MED ORDER — AMLODIPINE BESYLATE 5 MG PO TABS
2.5000 mg | ORAL_TABLET | Freq: Every day | ORAL | Status: DC
Start: 2015-10-23 — End: 2015-10-24
  Administered 2015-10-23 – 2015-10-24 (×2): 2.5 mg via ORAL
  Filled 2015-10-23 (×2): qty 1

## 2015-10-23 MED ORDER — NEPRO/CARBSTEADY PO LIQD: 237.0000 mL | ORAL | 0 refills | Status: DC

## 2015-10-23 NOTE — NC FL2 (Signed)
[place] LEVEL OF CARE SCREENING TOOL     IDENTIFICATION  Patient Name: Douglas Mccoy Birthdate: [birth year] Sex: male Admission Date (Current Location): 10/20/2015  [place] [facility] and [place] Number:  Herbalist and Address:  [facility],  [place] [address] [place]      Provider Number: [id number]  Attending Physician Name and Address:  Florencia Reasons, MD  Relative Name and Phone Number:       Current Level of Care: Hospital Recommended Level of Care: [place] Prior Approval Number:    Date Approved/Denied:   PASRR Number: [id number]  Discharge Plan: SNF    Current Diagnoses: Patient Active Problem List   Diagnosis Date Noted  . Malnutrition of moderate degree 10/22/2015  . Acute renal failure (ARF) ([place]) 10/20/2015  . Hyperkalemia 10/20/2015  . DM (diabetes mellitus), type 2 ([place]) 10/20/2015  . Hypertension 10/20/2015  . COPD (chronic obstructive pulmonary disease) ([place]) 10/20/2015  . Multiple myeloma ([place]) 10/20/2015    Orientation RESPIRATION BLADDER Height & Weight     Self, Time, Situation, Place  Normal Continent Weight: 147 lb 4.3 oz (66.8 kg) Height:  5' 5"  (165.1 cm)  BEHAVIORAL SYMPTOMS/MOOD NEUROLOGICAL BOWEL NUTRITION STATUS      Continent Diet (renal/carb modified with fluid restriction )  AMBULATORY STATUS COMMUNICATION OF NEEDS Skin   Extensive Assist Verbally Normal                       Personal Care Assistance Level of Assistance  Bathing, Dressing Bathing Assistance: Limited assistance   Dressing Assistance: Limited assistance     Functional Limitations Info             SPECIAL CARE FACTORS FREQUENCY  PT (By licensed PT), OT (By licensed OT)     PT Frequency: 5 OT Frequency: 5            Contractures      Additional Factors Info  Code Status, Allergies Code Status Info: Fullcode Allergies Info: Amoxapine And Related, Augmentin Amoxicillin-pot Clavulanate            Current Medications (10/23/2015):  This is the current hospital active medication list Current Facility-Administered Medications  Medication Dose Route Frequency Provider Last Rate Last Dose  . acetaminophen (TYLENOL) tablet 650 mg  650 mg Oral Q6H PRN Toy Baker, MD       Or  . acetaminophen (TYLENOL) suppository 650 mg  650 mg Rectal Q6H PRN Toy Baker, MD      . aspirin tablet 325 mg  325 mg Oral Daily Toy Baker, MD   325 mg at 10/23/15 1005  . carvedilol (COREG) tablet 12.5 mg  12.5 mg Oral BID WC Florencia Reasons, MD   12.5 mg at 10/23/15 0800  . cholestyramine light (PREVALITE) packet 4 g  4 g Oral BID Florencia Reasons, MD   4 g at 10/23/15 1005  . cloNIDine (CATAPRES) tablet 0.1 mg  0.1 mg Oral QHS Florencia Reasons, MD   0.1 mg at 10/22/15 2228  . enoxaparin (LOVENOX) injection 30 mg  30 mg Subcutaneous Q24H Toy Baker, MD   30 mg at 10/22/15 2230  . famotidine (PEPCID) tablet 20 mg  20 mg Oral BID Florencia Reasons, MD   20 mg at 10/23/15 1006  . feeding supplement (NEPRO CARB STEADY) liquid 237 mL  237 mL Oral Q24H Florencia Reasons, MD   237 mL at 10/23/15 1005  . gi cocktail (Maalox,Lidocaine,Donnatal)  30  mL Oral BID PRN Florencia Reasons, MD   30 mL at 10/21/15 1251  . HYDROcodone-acetaminophen (NORCO/VICODIN) 5-325 MG per tablet 1-2 tablet  1-2 tablet Oral Q4H PRN Toy Baker, MD   2 tablet at 10/23/15 1016  . insulin aspart (novoLOG) injection 0-5 Units  0-5 Units Subcutaneous QHS Anastassia Doutova, MD      . insulin aspart (novoLOG) injection 0-9 Units  0-9 Units Subcutaneous TID WC Toy Baker, MD   1 Units at 10/23/15 0800  . levETIRAcetam (KEPPRA) tablet 500 mg  500 mg Oral BID Toy Baker, MD   500 mg at 10/23/15 1006  . levothyroxine (SYNTHROID, LEVOTHROID) tablet 50 mcg  50 mcg Oral QHS Florencia Reasons, MD   50 mcg at 10/22/15 2229  . loperamide (IMODIUM) capsule 2 mg  2 mg Oral TID PRN Florencia Reasons, MD   2 mg at 10/21/15 1735  . MEDLINE mouth rinse  15 mL Mouth Rinse BID Florencia Reasons,  MD   15 mL at 10/22/15 1026  . ondansetron (ZOFRAN) tablet 4 mg  4 mg Oral Q6H PRN Toy Baker, MD       Or  . ondansetron (ZOFRAN) injection 4 mg  4 mg Intravenous Q6H PRN Toy Baker, MD      . sertraline (ZOLOFT) tablet 50 mg  50 mg Oral Daily Toy Baker, MD   50 mg at 10/23/15 1000  . simvastatin (ZOCOR) tablet 10 mg  10 mg Oral QHS Toy Baker, MD   10 mg at 10/22/15 2229  . sodium bicarbonate tablet 650 mg  650 mg Oral Daily Florencia Reasons, MD   650 mg at 10/23/15 1005  . sodium chloride flush (NS) 0.9 % injection 3 mL  3 mL Intravenous Q12H Toy Baker, MD   3 mL at 10/23/15 1007     Discharge Medications: Please see discharge summary for a list of discharge medications.  Relevant Imaging Results:  Relevant Lab Results:   Additional Information SSN: [id number]  Standley Brooking, LCSW

## 2015-10-23 NOTE — Discharge Summary (Addendum)
Discharge Summary  Douglas Mccoy QIO:[id number] DOB: [birth year]  PCP: [place] date: 10/20/2015 Discharge date: 10/24/2015  Time spent: >62mns  Recommendations for Outpatient Follow-up:  1. F/u with SNF MD  for hospital discharge follow up, repeat cbc/bmp at follow up, snf to monitor renal function, potassium level and blood sugar. May need to start diabetes medication if diet control is not sufficient to control am blood sugar, SNF MD to continue adjust blood pressure medications, patient need to have  repeat tsh in 4-6 months  Discharge Diagnoses:  Active Hospital Problems   Diagnosis Date Noted  . Malnutrition of moderate degree 10/22/2015  . Acute renal failure (ARF) ([facility] 10/20/2015  . Hyperkalemia 10/20/2015  . DM (diabetes mellitus), type 2 ([place] 10/20/2015  . Hypertension 10/20/2015  . COPD (chronic obstructive pulmonary disease) ([facility] 10/20/2015  . Multiple myeloma ([facility] 10/20/2015    Resolved Hospital Problems   Diagnosis Date Noted Date Resolved  No resolved problems to display.    Discharge Condition: stable  Diet recommendation: heart healthy/carb modified  Filed Weights   10/20/15 1455 10/20/15 2326  Weight: 65.8 kg (145 lb) 66.8 kg (147 lb 4.3 oz)    History of present illness:  [facility] none Patient coming from:  Homeless    Chief Complaint: lightheaded  HPI: HJericho Alcornis a 70y.o. 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 Recently his blood sugar has been under poor control he has had some episodes of high blood sugar.  Patient has chronic diarrhea. No nausea no vomiting no fevers or chills. No chest pain shortness of breath. No neurological complaints. He states usually his potassium is low and he has to suplement it denies  any new medications. He has not been eating well and reports very poor appetite.     Patient has hx of CAD sp CABG with perioperative CVA in 2004. Patient has had 2 seizures and have been on Keppra. Regarding multiple Myeloma he is sp bone marrow transplant in 2013 and currently not on any chemotherapy states he is in remission [facility]    IN ER:  Temp (24hrs), Avg:97.7 F (36.5 C), Min:97.7 F (36.5 C), Max:97.7 F (36.5 C) Heart rate 62 at pressure 169/70 Glucose 191 sodium 133 potassium greater than 7.5 creatinine 2.7 up from baseline 1.5 in September  Protein 9.0 albumin 4.8 AST 56 ALT 78 WBC 12.2 hemoglobin 13.1  Hospital Course:  Active Problems:   Acute renal failure (ARF) (HCC)   Hyperkalemia   DM (diabetes mellitus), type 2 (HCC)   Hypertension   COPD (chronic obstructive pulmonary disease) (HCC)   Multiple myeloma (HCC)   Malnutrition of moderate degree   70y.o. malewith medical history significant of COPD, DM2 HL, HTN, chronic diarrhea for 8 years CVA, bone cancer,being admitted for acute on chronic renal failure and hyperkalemia  Present on Admission: .Acute renal failure (ARF) (HCC) - possible component of CKD, no prior lab available,  dehydration secondary to decreased by mouth intake also contribute to cr elevation, ua no infection,  renal uKoreano hydronephrosis lisinopril 489mpo qd held since admission, renal dosing meds, restarted at lower dose  Lisinopril 1065mo qd at discharge  Cr normalized at discharge. Repeat bmp at hospital follow up.  .Hyperkalemia-  k 7.5 on admission, though no significant EKG changes, Patient has been treated with albuterol, calcium, insulin glucose, has ordered kayexalate Stop potassium supplementation and lisinopril k down to 5.6, but remain at 5.6 with acidosis,  started bicarb ivf ,  Potassium normalized  Metabolic acidosis, improving, change iv bicarb to oral ,  normalized, due to chronic diarrhea will need bicarb supplement long term till diarrhea resolves.  .Hypertension, with bradycardia on admission, bradycardia has resolved Currently on increased dose coreg, and decreased does of clonidine,  Start low dose norvasc, start low dose lisinopril , continue to monitor k and cr planned discharge cancelled on 10/9 due to elevated blood pressure, blood pressure meds adjusted, bp better on 10/10, he is discharged to SNF, SNF MD to continue adjust bp meds.  Elevation of lft:  hepatitis panel negative, [place] + fatty liver, otherwise unremarkable, lft normalized  he is also on statin  Hypothyroidism: tsh elevated, increase synthroid, repeat tsh in 4-6 weeks  .COPD (chronic obstructive pulmonary disease) (HCC)stable continue to monitor  .h/o Multiple myeloma (HCC)s/p bone marrow transplant at [facility]. patient states currently in remission ,followed by [facility], hgb stable, ca wnl. No bone pain  Coronary artery disease , CAD sp CABG with perioperative CVA in 2004, currently stable, no chest pain ,no acute EKG changes, continue aspirin, coreg, statin  History CVA x2 with left finger numbness, poor memories, currently stable continue aspirin/statin  History of seizure disorder - continue Keppra, last seizure one and half month ago  Impaired memory at baseline, know the year/month, know he is in the hospital, not able to name the hospital.  Chronic diarrhea, report since after BMT. Diarrhea stopped on questran, no bm x2 days, questran changed from daily to MWF , hold if constipation.  Non-severe (moderate) malnutrition in context of acute illness/injury: nutrition input appreciated  Borderline diabetes (new diagnosis), a1c 7, am fasting glucose range from 99 to 160, diet control for now, may not be a candidate for metformin If cr fluctuating, pmd to continue monitor  Chronic back pain, on ibuprofen at home, continue ibuprofen prn now  that his renal function has improved, will prescribe 5 tabs of percocet in case breakthrough pain.  Homeless: Education officer, museum consulted, patient to go to SNF  DVT prophylaxis while in the hospital:Lovenox low dose  Code Status:FULL CODE as per patient   Family Communication:Family not at Bedside  [facility]position Plan[facility]   Consults called:none   Procedures:  none  Antibiotics:  none   Discharge Exam: BP (!) 162/65 (BP Location: Right Arm)   Pulse 65   Temp 97.9 F (36.6 C) (Oral)   Resp 18   Ht _0  (1.651 m)   Wt 66.8 kg (147 lb 4.3 oz)   SpO2 99%   BMI 24.51 kg/m     General:  NAD, frail,   Cardiovascular: RRR, healed mild line scar from prior CABG, + murmur  Respiratory: CTABL  Abdomen: Soft/ND/NT, positive BS  Musculoskeletal: No Edema  Neuro: alert and oriented, slight memory impairment   Discharge Instructions You were cared for by a hospitalist during your hospital stay. If you have any questions about your discharge medications or the care you received while you were in the hospital after you are discharged, you can call the unit and asked to speak with the hospitalist on call if the hospitalist that took care of you is not available. Once you are discharged, your primary care physician will handle any  further medical issues. Please note that NO REFILLS for any discharge medications will be authorized once you are discharged, as it is imperative that you return to your primary care physician (or establish a relationship with a primary care physician if you do not have one) for your aftercare needs so that they can reassess your need for medications and monitor your lab values.  Discharge Instructions    Diet Carb Modified    Complete by:  As directed    Low fat, heart healthy   Diet Carb Modified    Complete by:  As directed    Low fat, low carb.   Increase activity  slowly    Complete by:  As directed    Increase activity slowly    Complete by:  As directed        Medication List    STOP taking these medications   magnesium oxide 400 MG tablet Commonly known as:  MAG-OX   potassium chloride SA 20 MEQ tablet Commonly known as:  K-DUR,KLOR-CON     TAKE these medications   acyclovir 200 MG capsule Commonly known as:  ZOVIRAX Take 200 mg by mouth 2 (two) times daily.   albuterol 108 (90 Base) MCG/ACT inhaler Commonly known as:  PROVENTIL HFA;VENTOLIN HFA Inhale 1-2 puffs into the lungs every 8 (eight) hours as needed for wheezing or shortness of breath.   amLODipine 5 MG tablet Commonly known as:  NORVASC Take 1 tablet (5 mg total) by mouth daily.   aspirin 325 MG tablet Take 325 mg by mouth daily.   carvedilol 12.5 MG tablet Commonly known as:  COREG Take 1 tablet (12.5 mg total) by mouth 2 (two) times daily with a meal. What changed:  how much to take   cholestyramine light 4 g packet Commonly known as:  PREVALITE Take 1 packet (4 g total) by mouth every Monday, Wednesday, and Friday. Hold if constipation Start taking on:  10/25/2015   cloNIDine 0.1 MG tablet Commonly known as:  CATAPRES Take 1 tablet (0.1 mg total) by mouth at bedtime. What changed:  when to take this   famotidine 20 MG tablet Commonly known as:  PEPCID Take 1 tablet (20 mg total) by mouth daily.   feeding supplement (NEPRO CARB STEADY) Liqd Take 237 mLs by mouth daily.   guaiFENesin 600 MG 12 hr tablet Commonly known as:  MUCINEX Take 600 mg by mouth 2 (two) times daily as needed for cough.   HYDROcodone-acetaminophen 5-325 MG tablet Commonly known as:  NORCO/VICODIN Take 1 tablet by mouth every 6 (six) hours as needed for moderate pain.   ibuprofen 600 MG tablet Commonly known as:  ADVIL,MOTRIN Take 600 mg by mouth every 8 (eight) hours as needed for moderate pain.   insulin aspart 100 UNIT/ML injection Commonly known as:  novoLOG Before each  meal 3 times a day, 140-199 - 2 units, 200-250 - 4 units, 251-299 - 6 units,  300-349 - 8 units,  350 or above 10 units. Insulin PEN if approved, provide syringes and needles if needed.   levETIRAcetam 500 MG tablet Commonly known as:  KEPPRA Take 500 mg by mouth 2 (two) times daily.   levothyroxine 50 MCG tablet Commonly known as:  SYNTHROID Take 1 tablet (50 mcg total) by mouth daily before breakfast. What changed:  medication strength  how much to take  when to take this   lisinopril 10 MG tablet Commonly known as:  ZESTRIL Take 1 tablet (10 mg total)  by mouth daily. What changed:  medication strength  how much to take   loperamide 2 MG capsule Commonly known as:  IMODIUM Take 1 capsule (2 mg total) by mouth 3 (three) times daily as needed for diarrhea or loose stools.   sertraline 100 MG tablet Commonly known as:  ZOLOFT Take 50 mg by mouth daily.   simvastatin 10 MG tablet Commonly known as:  ZOCOR Take 10 mg by mouth at bedtime.   sodium bicarbonate 650 MG tablet Take 1 tablet (650 mg total) by mouth daily.   tiZANidine 4 MG capsule Commonly known as:  ZANAFLEX Take 4 mg by mouth 3 (three) times daily as needed for muscle spasms.      Allergies  Allergen Reactions  . Amoxapine And Related Other (See Comments)    unknown  . Augmentin [Amoxicillin-Pot Clavulanate] Other (See Comments)    Unknown reaction Has patient had a PCN reaction causing immediate rash, facial/tongue/throat swelling, SOB or lightheadedness with hypotension: unknown Has patient had a PCN reaction causing severe rash involving mucus membranes or skin necrosis: unknown Has patient had a PCN reaction that required hospitalization: unknown Has patient had a PCN reaction occurring within the last 10 years: no If all of the above answers are "NO", then may proceed with Cephalosporin use.    Contact information for after-discharge care    [facility] .   Specialty:  [place] information: [address] [place] [phone]               The results of significant diagnostics from this hospitalization (including imaging, microbiology, ancillary and laboratory) are listed below for reference.    Significant Diagnostic Studies: US Abdomen Complete  Result Date: 10/21/2015 CLINICAL DATA:  Elevated liver transaminases and renal insufficiency. EXAM: ABDOMEN ULTRASOUND COMPLETE COMPARISON:  None. FINDINGS: Gallbladder: Surgically absent. Common bile duct: Diameter: Normal caliber of 4 mm. Liver: The liver demonstrates coarse echotexture and increased echogenicity, likely reflecting diffuse steatosis. No overt cirrhotic contour abnormalities or focal lesions are identified. There is no evidence of intrahepatic biliary ductal dilatation. The portal vein is open. IVC: No abnormality visualized. Pancreas: Visualized portion unremarkable. Spleen: Mild splenomegaly with estimated splenic volume of 466 mL. Right Kidney: Length: 11.1 cm. Mild cortical atrophy and mildly increased cortical echogenicity, likely reflecting chronic disease. No hydronephrosis or focal lesion. Left Kidney: Length: 10.9 cm. Cortical atrophy without evidence of hydronephrosis or focal lesion. Abdominal aorta: No aneurysm visualized. Other findings: None. IMPRESSION: 1. Diffuse hepatic steatosis without overt cirrhosis by ultrasound. No focal liver lesions identified. 2. Mild splenomegaly. 3. Cortical atrophy of both kidneys without evidence of hydronephrosis. Electronically Signed   By: Aletta Edouard M.D.   On: 10/21/2015 15:03    Microbiology: Recent Results (from the past 240 hour(s))  MRSA PCR Screening     Status: None   Collection Time: 10/21/15 12:06 AM  Result Value Ref Range Status   MRSA by PCR NEGATIVE NEGATIVE Final    Comment:        The GeneXpert MRSA Assay (FDA approved for NASAL specimens only), is  one component of a comprehensive MRSA colonization surveillance program. It is not intended to diagnose MRSA infection nor to guide or monitor treatment for MRSA infections.      Labs: Basic Metabolic Panel:  Recent Labs Lab 10/21/15 0402  10/21/15 1558 10/21/15 1846 10/22/15 0343 10/23/15 0435 10/24/15 0353  NA 137  138  < > 140 141 141 141 141  K 6.1*  6.1*  < > 4.4 4.0 4.2 3.8 3.5  CL 115*  114*  < > 116* 116* 114* 108 109  CO2 13*  14*  < > 15* 17* 19* 26 25  GLUCOSE 162*  165*  < > 215* 166* 98 134* 105*  BUN 83*  81*  < > 74* 74* 70* 42* 22*  CREATININE 2.51*  2.44*  < > 1.93* 2.00* 1.83* 1.37* 0.88  CALCIUM 9.5  9.6  < > 8.7* 8.5* 8.4* 8.5* 8.3*  MG 2.7*  --   --   --   --   --   --   PHOS 5.2*  --   --   --   --   --   --   < > = values in this interval not displayed. Liver Function Tests:  Recent Labs Lab 10/20/15 1919 10/21/15 0402 10/22/15 0343  AST 56* 44* 23  ALT 78* 71* 38  ALKPHOS 87 84 64  BILITOT 0.7 0.9 0.8  PROT 9.0* 8.7* 6.9  ALBUMIN 4.8 4.7 3.8    Recent Labs Lab 10/20/15 1919 10/21/15 0928  LIPASE 36 32   No results for input(s): AMMONIA in the last 168 hours. CBC:  Recent Labs Lab 10/20/15 2210 10/21/15 0402 10/22/15 0343 10/23/15 0435 10/24/15 0353  WBC 9.4 9.1 7.2 3.6* 3.9*  NEUTROABS 7.3  --   --   --   --   HGB 12.3* 12.6* 10.4* 9.9* 9.4*  HCT 36.0* 38.5* 31.3* 29.7* 28.0*  MCV 80.5 83.2 82.8 82.7 82.6  PLT 144* 139* 134* 124* 103*   Cardiac Enzymes: No results for input(s): CKTOTAL, CKMB, CKMBINDEX, TROPONINI in the last 168 hours. BNP: BNP (last 3 results) No results for input(s): BNP in the last 8760 hours.  ProBNP (last 3 results) No results for input(s): PROBNP in the last 8760 hours.  CBG:  Recent Labs Lab 10/23/15 1211 10/23/15 1833 10/23/15 2147 10/24/15 0818 10/24/15 1152  GLUCAP 212* 168* 183* 124* 113*       Signed:  Kaiah Hosea MD, PhD  [facility] 10/24/2015, 2:47  PM

## 2015-10-23 NOTE — Clinical Social Work Placement (Addendum)
Patient is set to discharge to [facility] today. Patient & sister, [name] aware. Discharge packet given to RN, Isabell Jarvis called for transport.     Raynaldo Opitz, [facility] Clinical Social Worker cell #: [phone]    CLINICAL SOCIAL WORK PLACEMENT  NOTE  Date:  10/23/2015  Patient Details  Name: Douglas Mccoy MRN: [id number] Date of Birth: 11/24/1945  Clinical Social Work is seeking post-discharge placement for this patient at the [place] level of care (*CSW will initial, date and re-position this form in  chart as items are completed):  Yes   Patient/family provided with [place] Work Department's list of facilities offering this level of care within the geographic area requested by the patient (or if unable, by the patient's family).  Yes   Patient/family informed of their freedom to choose among providers that offer the needed level of care, that participate in Medicare, Medicaid or managed care program needed by the patient, have an available bed and are willing to accept the patient.  Yes   Patient/family informed of [place]'s ownership interest in [facility] and [facility], as well as of the fact that they are under no obligation to receive care at these facilities.  PASRR submitted to EDS on 10/23/15     PASRR number received on 10/23/15     Existing PASRR number confirmed on       FL2 transmitted to all facilities in geographic area requested by pt/family on 10/23/15     FL2 transmitted to all facilities within larger geographic area on       Patient informed that his/her managed care company has contracts with or will negotiate with certain facilities, including the following:        Yes   Patient/family informed of bed offers received.  Patient chooses bed at [place]     Physician recommends and patient chooses bed at      Patient to be transferred to  [facility] on 10/24/15.  Patient to be transferred to facility by [facility]     Patient family notified on 10/23/15 of transfer.  Name of family member notified:  patient's sister, [name] via phone     PHYSICIAN       Additional Comment:    _______________________________________________ Standley Brooking, LCSW 10/23/2015, 3:38 PM

## 2015-10-23 NOTE — Progress Notes (Signed)
[facility] here to transport patient to SNF.  BP found to be elevated 200's/70's.  BP taken in both arms with dinamap as well as manually.  Dr Erlinda Hong notified, orders received.  Will continue to monitor.

## 2015-10-23 NOTE — Clinical Social Work Placement (Signed)
CSW received consult that patient was admitted from [facility] though PT recommended SNF at discharge. CSW met with patient who is agreeable with plan for SNF, accepted bed at [facility]. Anticipating discharge today.    Raynaldo Opitz, [facility] Clinical Social Worker cell #: [phone]    CLINICAL SOCIAL WORK PLACEMENT  NOTE  Date:  10/23/2015  Patient Details  Name: Douglas Mccoy MRN: [id number] Date of Birth: 02-12-1945  Clinical Social Work is seeking post-discharge placement for this patient at the [place] level of care (*CSW will initial, date and re-position this form in  chart as items are completed):  Yes   Patient/family provided with [place] Work Department's list of facilities offering this level of care within the geographic area requested by the patient (or if unable, by the patient's family).  Yes   Patient/family informed of their freedom to choose among providers that offer the needed level of care, that participate in Medicare, Medicaid or managed care program needed by the patient, have an available bed and are willing to accept the patient.  Yes   Patient/family informed of [facility]'s ownership interest in [facility] and [facility], as well as of the fact that they are under no obligation to receive care at these facilities.  PASRR submitted to EDS on 10/23/15     PASRR number received on 10/23/15     Existing PASRR number confirmed on       FL2 transmitted to all facilities in geographic area requested by pt/family on 10/23/15     FL2 transmitted to all facilities within larger geographic area on       Patient informed that his/her managed care company has contracts with or will negotiate with certain facilities, including the following:        Yes   Patient/family informed of bed offers received.  Patient chooses bed at [place]     Physician  recommends and patient chooses bed at      Patient to be transferred to [facility] on  .  Patient to be transferred to facility by       Patient family notified on   of transfer.  Name of family member notified:        PHYSICIAN       Additional Comment:    _______________________________________________ Standley Brooking, LCSW 10/23/2015, 3:09 PM

## 2015-10-23 NOTE — Progress Notes (Signed)
PROGRESS NOTE  Douglas Mccoy OQH:[id number] DOB: 07-02-1945 DOA: 10/20/2015 PCP: [facility]  HPI/Recap of past 24 hours:   Discharge to snf cancelled due to elevated blood pressure, denies pain  no fever,   Assessment/Plan: Active Problems:   Acute renal failure (ARF) (HCC)   Hyperkalemia   DM (diabetes mellitus), type 2 (HCC)   Hypertension   COPD (chronic obstructive pulmonary disease) ([place])   Multiple myeloma (HCC)   Malnutrition of moderate degree   70 y.o. male with medical history significant of COPD, DM2 HL, HTN, chronic diarrhea for 8 years  CVA, bone cancer, being admitted for acute on chronic renal failure and hyperkalemia  Present on Admission: . Acute renal failure (ARF) (HCC) - possible component of CKD, no prior lab available,  dehydration secondary to decreased by mouth intake also contribute to cr elevation, ua no infection,  renal US no hydronephrosis lisinopril held since admission, renal dosing meds,  Cr improving  . Hyperkalemia- k 7.5 on admission, though no significant EKG changes, Patient has been treated with albuterol, calcium, insulin glucose, has ordered kayexalate Stop potassium supplementation and lisinopril k down to 5.6, but remain at 5.6 with acidosis,  started bicarb ivf ,  Potassium normalized  Metabolic acidosis, improving, change iv bicarb to oral   . Hypertension ,  Currently on increased dose coreg, and decreased does of clonidine,  bp better, heart rate better, bradycardia has resolved continue titrate bp meds.  Elevation of lft:  hepatitis panel pending, [place] + fatty liver, otherwise unremarkable, lft normalized  he is also on statin  hypothyroidism: increase synthroid  . COPD (chronic obstructive pulmonary disease) ([place]) stable continue to monitor  . h/o Multiple myeloma ([place]) s/p bone marrow transplant at [facility]. patient states currently in remission ,followed by [facility], hgb stable, ca wnl. No bone  pain  Coronary artery disease , CAD sp CABG with perioperative CVA in 2004, currently stable, no chest pain ,no acute EKG changes, continue aspirin, coreg, statin  History CVA currently stable continue aspirin/statin  History of seizure disorder - continue Keppra  Impaired memory at baseline, know the year/month, know he is in the hospital, not able to name the hospital.  Chronic diarrhea, report since after BMT. Better.  Non-severe (moderate) malnutrition in context of acute illness/injury: nutrition input appreciated  Homeless: social worker consulted  DVT prophylaxis while in the hospital:    Lovenox low dose    Code Status:  FULL CODE  as per patient    Family Communication:   Family not  at  Bedside   Disposition Plan:                         snf on 10/10                           Consults called: none      Procedures:  none  Antibiotics:  none   Objective: BP (!) 188/76 (BP Location: Left Arm)   Pulse 67   Temp 98 F (36.7 C) (Oral)   Resp 18   Ht 5' 5"  (1.651 m)   Wt 66.8 kg (147 lb 4.3 oz)   SpO2 97%   BMI 24.51 kg/m   Intake/Output Summary (Last 24 hours) at 10/23/15 1808 Last data filed at 10/23/15 1005  Gross per 24 hour  Intake             1140  ml  Output              950 ml  Net              190 ml   Filed Weights   10/20/15 1455 10/20/15 2326  Weight: 65.8 kg (145 lb) 66.8 kg (147 lb 4.3 oz)    Exam:   General:  NAD, frail,   Cardiovascular: RRR, healed mild line scar from prior CABG, + murmur  Respiratory: CTABL  Abdomen: Soft/ND/NT, positive BS  Musculoskeletal: No Edema  Neuro: alert and oriented, slight memory impairment  Data Reviewed: Basic Metabolic Panel:  Recent Labs Lab 10/21/15 0402  10/21/15 0928 10/21/15 1558 10/21/15 1846 10/22/15 0343 10/23/15 0435  NA 137  138  < > 138 140 141 141 141  K 6.1*  6.1*  < > 5.6* 4.4 4.0 4.2 3.8  CL 115*  114*  < > 119* 116* 116* 114* 108  CO2 13*  14*  < >  12* 15* 17* 19* 26  GLUCOSE 162*  165*  < > 139* 215* 166* 98 134*  BUN 83*  81*  < > 77* 74* 74* 70* 42*  CREATININE 2.51*  2.44*  < > 2.22* 1.93* 2.00* 1.83* 1.37*  CALCIUM 9.5  9.6  < > 9.1 8.7* 8.5* 8.4* 8.5*  MG 2.7*  --   --   --   --   --   --   PHOS 5.2*  --   --   --   --   --   --   < > = values in this interval not displayed. Liver Function Tests:  Recent Labs Lab 10/20/15 1919 10/21/15 0402 10/22/15 0343  AST 56* 44* 23  ALT 78* 71* 38  ALKPHOS 87 84 64  BILITOT 0.7 0.9 0.8  PROT 9.0* 8.7* 6.9  ALBUMIN 4.8 4.7 3.8    Recent Labs Lab 10/20/15 1919 10/21/15 0928  LIPASE 36 32   No results for input(s): AMMONIA in the last 168 hours. CBC:  Recent Labs Lab 10/20/15 1612 10/20/15 2210 10/21/15 0402 10/22/15 0343 10/23/15 0435  WBC 12.2* 9.4 9.1 7.2 3.6*  NEUTROABS  --  7.3  --   --   --   HGB 13.1 12.3* 12.6* 10.4* 9.9*  HCT 40.7 36.0* 38.5* 31.3* 29.7*  MCV 84.4 80.5 83.2 82.8 82.7  PLT 152 144* 139* 134* 124*   Cardiac Enzymes:   No results for input(s): CKTOTAL, CKMB, CKMBINDEX, TROPONINI in the last 168 hours. BNP (last 3 results) No results for input(s): BNP in the last 8760 hours.  ProBNP (last 3 results) No results for input(s): PROBNP in the last 8760 hours.  CBG:  Recent Labs Lab 10/22/15 1201 10/22/15 1806 10/22/15 2126 10/23/15 0723 10/23/15 1211  GLUCAP 197* 188* 177* 128* 212*    Recent Results (from the past 240 hour(s))  MRSA PCR Screening     Status: None   Collection Time: 10/21/15 12:06 AM  Result Value Ref Range Status   MRSA by PCR NEGATIVE NEGATIVE Final    Comment:        The GeneXpert MRSA Assay (FDA approved for NASAL specimens only), is one component of a comprehensive MRSA colonization surveillance program. It is not intended to diagnose MRSA infection nor to guide or monitor treatment for MRSA infections.      Studies: No results found.  Scheduled Meds: . amLODipine  2.5 mg Oral Daily  .  aspirin  325 mg  Oral Daily  . carvedilol  12.5 mg Oral BID WC  . cholestyramine light  4 g Oral BID  . cloNIDine  0.1 mg Oral QHS  . enoxaparin (LOVENOX) injection  30 mg Subcutaneous Q24H  . famotidine  20 mg Oral BID  . feeding supplement (NEPRO CARB STEADY)  237 mL Oral Q24H  . insulin aspart  0-5 Units Subcutaneous QHS  . insulin aspart  0-9 Units Subcutaneous TID WC  . levETIRAcetam  500 mg Oral BID  . levothyroxine  50 mcg Oral QHS  . [START ON 10/24/2015] lisinopril  2.5 mg Oral Daily  . mouth rinse  15 mL Mouth Rinse BID  . sertraline  50 mg Oral Daily  . simvastatin  10 mg Oral QHS  . sodium bicarbonate  650 mg Oral Daily  . sodium chloride flush  3 mL Intravenous Q12H    Continuous Infusions:     Time spent: 41mns  Douglas Ringel MD, PhD  [facility] Pager [phone] If 7PM-7AM, please contact night-coverage at [email or web address], password [email or web address]10/09/2015, 6:08 PM  LOS: 3 days

## 2015-10-24 LAB — CBC
HCT: 28 % — ABNORMAL LOW (ref 39.0–52.0)
Hemoglobin: 9.4 g/dL — ABNORMAL LOW (ref 13.0–17.0)
MCH: 27.7 pg (ref 26.0–34.0)
MCHC: 33.6 g/dL (ref 30.0–36.0)
MCV: 82.6 fL (ref 78.0–100.0)
PLATELETS: 103 10*3/uL — AB (ref 150–400)
RBC: 3.39 MIL/uL — AB (ref 4.22–5.81)
RDW: 16.5 % — ABNORMAL HIGH (ref 11.5–15.5)
WBC: 3.9 10*3/uL — AB (ref 4.0–10.5)

## 2015-10-24 LAB — BASIC METABOLIC PANEL
ANION GAP: 7 (ref 5–15)
BUN: 22 mg/dL — ABNORMAL HIGH (ref 6–20)
CALCIUM: 8.3 mg/dL — AB (ref 8.9–10.3)
CO2: 25 mmol/L (ref 22–32)
Chloride: 109 mmol/L (ref 101–111)
Creatinine, Ser: 0.88 mg/dL (ref 0.61–1.24)
Glucose, Bld: 105 mg/dL — ABNORMAL HIGH (ref 65–99)
POTASSIUM: 3.5 mmol/L (ref 3.5–5.1)
SODIUM: 141 mmol/L (ref 135–145)

## 2015-10-24 LAB — GLUCOSE, CAPILLARY
Glucose-Capillary: 124 mg/dL — ABNORMAL HIGH (ref 65–99)
Glucose-Capillary: 113 mg/dL — ABNORMAL HIGH (ref 65–99)

## 2015-10-24 MED ORDER — CHOLESTYRAMINE LIGHT 4 G PO PACK: 4.0000 g | PACK | ORAL | 0 refills | Status: DC

## 2015-10-24 MED ORDER — AMLODIPINE BESYLATE 5 MG PO TABS: 5.0000 mg | ORAL_TABLET | Freq: Every day | ORAL | 0 refills | Status: DC

## 2015-10-24 MED ORDER — LISINOPRIL 10 MG PO TABS: 10.0000 mg | ORAL_TABLET | Freq: Every day | ORAL | 0 refills | Status: DC

## 2015-10-24 MED ORDER — ENOXAPARIN SODIUM 40 MG/0.4ML ~~LOC~~ SOLN: 40.0000 mg | SUBCUTANEOUS | Status: DC

## 2015-10-24 NOTE — Progress Notes (Signed)
Occupational Therapy Treatment Patient Details Name: Douglas Mccoy MRN: [id number] DOB: [birth year] Today's Date: 10/24/2015    History of present illness Pt is a 70 year old male admitted for lightheadedness.  PMH significant for COPD, CM, HTN, CVA x 2, CAD, multiple myeloma and seizures   OT comments  Pt needs min guard to min A for ADLs: experienced LOB in standing and sitting.  Continue to recommend SNF for rehab prior to home  Follow Up Recommendations  SNF    Equipment Recommendations  None recommended by OT    Recommendations for Other Services      Precautions / Restrictions Precautions Precautions: Fall Restrictions Weight Bearing Restrictions: No       Mobility Bed Mobility         Supine to sit: HOB elevated;Supervision Sit to supine: Supervision;HOB elevated      Transfers   Equipment used: Rolling walker (2 wheeled)   Sit to Stand: Min guard         General transfer comment: for safety.  No dizziness and no LOB during static standing.  LOB when turning head with unilateral support    Balance           Standing balance support: Single extremity supported Standing balance-Leahy Scale: Poor Standing balance comment: one LOB during ADLs when pt turned head to R. Also had one seated LOB--see adl section comments                   ADL               Lower Body Bathing: Min guard;Sit to/from stand       Lower Body Dressing: Minimal assistance;Sit to/from stand                 General ADL Comments: performed ADL from EOB.  Pt had one LOB when standing, posteriorly--assistance to regain balance. Pt had turned head to R quickly.  Also had LOB when donning R sock in sitting; he lost balance to Left and posteriorly then. No toileting needs at this time      Vision                     Perception     Praxis      Cognition   Behavior During Therapy: [facility] for tasks assessed/performed Overall Cognitive Status:  Within Functional Limits for tasks assessed                       Extremity/Trunk Assessment               Exercises     Shoulder Instructions       General Comments      Pertinent Vitals/ Pain       Pain Assessment: No/denies pain  Home Living                                          Prior Functioning/Environment              Frequency  Min 2X/week        Progress Toward Goals  OT Goals(current goals can now be found in the care plan section)  Progress towards OT goals: Progressing toward goals     Plan      Co-evaluation  End of Session     Activity Tolerance Patient tolerated treatment well   Patient Left in bed;with call bell/phone within reach   Nurse Communication          Time: 8756-4332 OT Time Calculation (min): 19 min  Charges: OT General Charges $OT Visit: 1 Procedure OT Treatments $Self Care/Home Management : 8-22 mins  Amzie Sillas 10/24/2015, 2:39 PM  Lesle Chris, OTR/L [phone] 10/24/2015

## 2015-10-24 NOTE — Progress Notes (Signed)
Physical Therapy Treatment Patient Details Name: Douglas Mccoy MRN: [id number] DOB: 02/07/1945 Today's Date: 10/24/2015    History of Present Illness Pt is a 70 year old male admitted for lightheadedness.  PMH significant for COPD, CM, HTN, CVA x 2, CAD, multiple myeloma and seizures    PT Comments    Pt assisted with ambulating in hallway.  BP obtained pre and post ambulation in sitting position with no significant change (see docflowsheets for specifics).  Possible d/c to SNF today.  Follow Up Recommendations  Supervision/Assistance - 24 hour;SNF     Equipment Recommendations  None recommended by PT    Recommendations for Other Services       Precautions / Restrictions Precautions Precautions: Fall Precaution Comments: check BP Restrictions Weight Bearing Restrictions: No    Mobility  Bed Mobility Overal bed mobility: Needs Assistance Bed Mobility: Supine to Sit;Sit to Supine     Supine to sit: HOB elevated;Supervision Sit to supine: Supervision;HOB elevated      Transfers Overall transfer level: Needs assistance Equipment used: Rolling walker (2 wheeled) Transfers: Sit to/from Stand Sit to Stand: Min guard         General transfer comment: verbal cues for safe technique, pt reports dizziness upon standing, educated pt to wait for dizziness to improve/resolve prior to continuing mobility or sit if becomes worse  Ambulation/Gait Ambulation/Gait assistance: Min guard Ambulation Distance (Feet): 120 Feet Assistive device: Rolling walker (2 wheeled) Gait Pattern/deviations: Step-through pattern;Decreased stride length     General Gait Details: slow pace, occasional LOB - mostly to R side however reports bilateral LOB, pt able to self correct with use of RW for stabilizing   Stairs            Wheelchair Mobility    Modified Rankin (Stroke Patients Only)       Balance Overall balance assessment: Needs assistance         Standing balance  support: Bilateral upper extremity supported Standing balance-Leahy Scale: Poor Standing balance comment: requires UE support with RW, reports hx of falls with occurrence of dizziness                    Cognition Arousal/Alertness: Awake/alert Behavior During Therapy: WFL for tasks assessed/performed Overall Cognitive Status: Within Functional Limits for tasks assessed                      Exercises      General Comments        Pertinent Vitals/Pain Pain Assessment: 0-10 Pain Score: 5  Pain Location: low back pain Pain Descriptors / Indicators: Aching Pain Intervention(s): Limited activity within patient's tolerance;Monitored during session;Repositioned    Home Living                      Prior Function            PT Goals (current goals can now be found in the care plan section) Progress towards PT goals: Progressing toward goals    Frequency    Min 3X/week      PT Plan Current plan remains appropriate    Co-evaluation             End of Session Equipment Utilized During Treatment: Gait belt Activity Tolerance: Patient tolerated treatment well Patient left: in bed;with call bell/phone within reach;with bed alarm set     Time: 9628-3662 PT Time Calculation (min) (ACUTE ONLY): 13 min  Charges:  $Gait Training: 8-22 mins  G Codes:      Husna Krone,KATHrine E November 07, 2015, 9:46 AM Carmelia Bake, PT, DPT 11/07/15 Pager: [phone]

## 2015-10-24 NOTE — Care Management Important Message (Signed)
Important Message  Patient Details  Name: Douglas Mccoy MRN: [id number] Date of Birth: [birth year]   Medicare Important Message Given:  Yes    [name] 10/24/2015, 10:10 AMImportant Message  Patient Details  Name: Douglas Mccoy MRN: [id number] Date of Birth: 1945/12/01   Medicare Important Message Given:  Yes    [name] 10/24/2015, 10:10 AM

## 2015-10-25 ENCOUNTER — Encounter: Payer: Self-pay | Admitting: Adult Health

## 2015-10-25 ENCOUNTER — Non-Acute Institutional Stay (SKILLED_NURSING_FACILITY): Payer: Self-pay | Admitting: Adult Health

## 2015-10-25 DIAGNOSIS — J449 Chronic obstructive pulmonary disease, unspecified: Secondary | ICD-10-CM

## 2015-10-25 DIAGNOSIS — I10 Essential (primary) hypertension: Secondary | ICD-10-CM

## 2015-10-25 DIAGNOSIS — E875 Hyperkalemia: Secondary | ICD-10-CM

## 2015-10-25 DIAGNOSIS — E1151 Type 2 diabetes mellitus with diabetic peripheral angiopathy without gangrene: Secondary | ICD-10-CM

## 2015-10-25 DIAGNOSIS — Z951 Presence of aortocoronary bypass graft: Secondary | ICD-10-CM | POA: Insufficient documentation

## 2015-10-25 DIAGNOSIS — G8929 Other chronic pain: Secondary | ICD-10-CM | POA: Insufficient documentation

## 2015-10-25 DIAGNOSIS — C9001 Multiple myeloma in remission: Secondary | ICD-10-CM

## 2015-10-25 DIAGNOSIS — M545 Low back pain, unspecified: Secondary | ICD-10-CM

## 2015-10-25 DIAGNOSIS — E872 Acidosis, unspecified: Secondary | ICD-10-CM

## 2015-10-25 DIAGNOSIS — F32A Depression, unspecified: Secondary | ICD-10-CM | POA: Insufficient documentation

## 2015-10-25 DIAGNOSIS — F329 Major depressive disorder, single episode, unspecified: Secondary | ICD-10-CM

## 2015-10-25 DIAGNOSIS — I6930 Unspecified sequelae of cerebral infarction: Secondary | ICD-10-CM

## 2015-10-25 DIAGNOSIS — I2510 Atherosclerotic heart disease of native coronary artery without angina pectoris: Secondary | ICD-10-CM | POA: Insufficient documentation

## 2015-10-25 DIAGNOSIS — I25118 Atherosclerotic heart disease of native coronary artery with other forms of angina pectoris: Secondary | ICD-10-CM

## 2015-10-25 NOTE — Progress Notes (Signed)
Patient ID: Douglas Mccoy, male   DOB: November 12, 1945, 70 y.o.   MRN: [id number]   Location:   [facility] Room Number: 127-A Place of Service:  SNF (31)   CODE STATUS: Full Code until otherwise determined  Allergies  Allergen Reactions  . Amoxapine And Related Other (See Comments)    unknown  . Augmentin [Amoxicillin-Pot Clavulanate] Other (See Comments)    Chief Complaint  Patient presents with  . Hospitalization Follow-up    Hospital follow up    HPI:  He has been hospitalized for acute renal failure with hyperkalemia; uncontrolled hypertension; copd; and metabolic acidosis. He has a history of multiple myeloma status post bone marrow transplant. He has a history of shingles 4-5 years ago and is now on long term acyclovir. He is here for short term rehab with his goal to return back home. He does have chronic back pain.    Past Medical History:  Diagnosis Date  . CAD (coronary artery disease)   . COPD (chronic obstructive pulmonary disease) ([place])   . Depression   . Diabetes mellitus without complication ([place])   . GERD (gastroesophageal reflux disease)   . Hyperlipidemia   . Hypertension   . Hypothyroidism   . Multiple myeloma ([place])   . Seizures ([place])   . Shingles   . Stroke [facility])      Past Surgical History:  Procedure Laterality Date  . BACK SURGERY     fracture  . BYPASS GRAFT     x4  . CAROTID ARTERY ANGIOPLASTY     Right carotid artery "cleaned out"  . CHOLECYSTECTOMY      Social History   Social History  . Marital status: Divorced    Spouse name: N/A  . Number of children: N/A  . Years of education: N/A   Occupational History  . Not on file.   Social History Main Topics  . Smoking status: Former Research scientist (life sciences)  . Smokeless tobacco: Never Used  . Alcohol use No  . Drug use: No  . Sexual activity: Not on file   Other Topics Concern  . Not on file   Social History Narrative  . No narrative on file   Family History  Problem Relation Age  of Onset  . Diabetes Mother   . Cancer Neg Hx   . Stroke Neg Hx       VITAL SIGNS BP (!) 191/83   Pulse 64   Temp 98.3 F (36.8 C) (Oral)   Resp 16   Ht _0  (1.651 m)   Wt 139 lb (63 kg)   SpO2 94%   BMI 23.13 kg/m   Patient's Medications  New Prescriptions   No medications on file  Previous Medications   Acyclovir 200 mg  Take 200 mg twice daily    ALBUTEROL (PROVENTIL HFA;VENTOLIN HFA) 108 (90 BASE) MCG/ACT INHALER    Inhale 1-2 puffs into the lungs every 8 (eight) hours as needed for wheezing or shortness of breath.   AMLODIPINE (NORVASC) 5 MG TABLET    Take 1 tablet (5 mg total) by mouth daily.   ASPIRIN 325 MG TABLET    Take 325 mg by mouth daily.   CARVEDILOL (COREG) 12.5 MG TABLET    Take 1 tablet (12.5 mg total) by mouth 2 (two) times daily with a meal.   CHOLESTYRAMINE LIGHT (PREVALITE) 4 G PACKET    Take 1 packet (4 g total) by mouth every Monday, Wednesday, and Friday. Hold if constipation  CLONIDINE (CATAPRES) 0.1 MG TABLET    Take 1 tablet (0.1 mg total) by mouth at bedtime.   FAMOTIDINE (PEPCID) 20 MG TABLET    Take 1 tablet (20 mg total) by mouth daily.   GUAIFENESIN (MUCINEX) 600 MG 12 HR TABLET    Take 600 mg by mouth 2 (two) times daily as needed for cough.    IBUPROFEN (ADVIL,MOTRIN) 600 MG TABLET    Take 600 mg by mouth every 8 (eight) hours as needed for moderate pain.    INSULIN ASPART (NOVOLOG) 100 UNIT/ML INJECTION    Inject into the skin. Inject as per sliding scale: if  0-139= 0 ; 140-199 = 2 units, 200-250= 4 units; 251-299= 6 units; 300-349= 8 units; 350-450= 10 units, subcutaneously before meals related to DM. Call MD for CBG less than 60 or greater than 450   LEVETIRACETAM (KEPPRA) 500 MG TABLET    Take 500 mg by mouth 2 (two) times daily.   LEVOTHYROXINE (SYNTHROID) 50 MCG TABLET    Take 1 tablet (50 mcg total) by mouth daily before breakfast.   LISINOPRIL (ZESTRIL) 10 MG TABLET    Take 1 tablet (10 mg total) by mouth daily.   LOPERAMIDE  (IMODIUM) 2 MG CAPSULE    Take 1 capsule (2 mg total) by mouth 3 (three) times daily as needed for diarrhea or loose stools.   SERTRALINE (ZOLOFT) 100 MG TABLET    Take 50 mg by mouth daily.   SIMVASTATIN (ZOCOR) 10 MG TABLET    Take 10 mg by mouth at bedtime.   SODIUM BICARBONATE 650 MG TABLET    Take 1 tablet (650 mg total) by mouth daily.   TIZANIDINE (ZANAFLEX) 4 MG CAPSULE    Take 4 mg by mouth 3 (three) times daily as needed for muscle spasms.  Modified Medications   No medications on file  Discontinued Medications     SIGNIFICANT DIAGNOSTIC EXAMS  10-21-15: abdominal ultrasound: 1. Diffuse hepatic steatosis without overt cirrhosis by ultrasound. No focal liver lesions identified. 2. Mild splenomegaly. 3. Cortical atrophy of both kidneys without evidence of hydronephrosis.    LABS REVIEWED:   10-06-15: wbc 6.2; hgb 10.7; hct 32.1; mcv 81.9; plt 110; glucose 397; bun 32; create 1.49; k+ 5.9; na++ 136 10-20-15; wbc 12.2; hgb 13.1; hct 40.7; mcv 84.4; plt 152; glucose 178; bun 92; creat 2.87; k+ >7.5; na++ 133; total protein 9; ast 56; alt 78; alk phos 87; albumin 4.8 10-21-15: wbc 9.1; hgb 12.6; hct 38.5; mcv 83.2 ;plt 139; glucose 162; bun 83; creat 2.51; k+ 6.1; na++ 137; phos 5.2; mag 2.7; ast 44; alt 71; alk phos 84 albumin 4.7; hgb a1c 7.1; tsh 5.264; free T3: 0.63; free T4: 1.8 10-22-15: wbc 7.2; hgb 10.4; hct 31.3; mcv 82.8 ;plt 134; glucose 98; bun 70; creat 1.83; k+ 4.2; na++141; liver normal albumin 3.8 10-24-15: wbc 3.9; hgb 9.4; hct 28.0; mcv 82.6 ;plt 103; glucose 105; bun 22; creat 0.88; k+ 3.5; na++ 141    Review of Systems  Constitutional: Negative for malaise/fatigue.  Respiratory: Negative for cough and shortness of breath.   Cardiovascular: Negative for chest pain, palpitations and leg swelling.  Gastrointestinal: Positive for diarrhea. Negative for abdominal pain, constipation and heartburn.       Is better since taking medication at hospital   Musculoskeletal:  Positive for back pain. Negative for joint pain and myalgias.       Has chronic back pain   Skin: Negative.   Neurological: Negative  for dizziness.  Psychiatric/Behavioral: The patient is not nervous/anxious.      Physical Exam  Constitutional: He is oriented to person, place, and time. No distress.  Eyes: Conjunctivae are normal.  Neck: Neck supple. No JVD present. No thyromegaly present.  Cardiovascular: Normal rate, regular rhythm and intact distal pulses.   Respiratory: Effort normal and breath sounds normal. No respiratory distress. He has no wheezes.  GI: Soft. Bowel sounds are normal. He exhibits no distension. There is no tenderness.  Musculoskeletal: He exhibits no edema.  Able to move all extremities   Lymphadenopathy:    He has no cervical adenopathy.  Neurological: He is alert and oriented to person, place, and time.  Skin: Skin is warm and dry. He is not diaphoretic.  Psychiatric: He has a normal mood and affect.      ASSESSMENT/ PLAN:  1. Chronic diarrhea: he has had for for past 8 years. He states that this is improving. Will continue questran 4 gm three times daily has imodium 2 mg three times daily as needed  2. History of shingles: will continue acyclovir 200 mg twice daily   3. Hypertension: poorly controlled, will continue coreg 12.5 mg twice daily will continue clonidine 0.1 mg nightly lisinopril 10 mg daily  asa 325 mg daily will increase norvasc to 10 mg daily  Will have nursing check blood pressure every 8 hours   4. COPD: will continue mucinex twice daily as needed; albuterol inhaler 1 or 2 puffs every 8 hours as needed.   5. CAD: status post cabg and angioplasty: no complaint of chest pain present; will continue asa 325 mg daily   6. CVA: is neurologically stable will continue asa 325 mg daily   7. Diabetes: hgb a1c 7.1; will continue novolog 140-199: 42 units; 200-250: 4 units; 251-299: 6 units; 300-349: 8 units; 350-450: 10 units; will monitor  8.  Seizure: no reports of seizure activity: will continue keppra 500 mg twice daily   9. Dyslipidemia: will continue zocor 10 mg daily  10. Depression : will continue zoloft 50 mg daily  11.  Acute on chronic renal failure: status post metabolic acidosis bun 22; creat 0.88; will continue sodium bicarbonate 650 mg daily   12: chronic back pain; is presently stable; will continue zanaflex 4 mg three times daily as needed has motrin 600 gm every 8 hours as needed  13. Multiple myeloma: is status post bone marrow transplant 2013: no current treatment at this time is followed by [facility].   14. Hyperkalemia: k+ 3.5 will monitor    Will check cbc; cmp    Time spent with patient 50   minutes >50% time spent counseling; reviewing medical record; tests; labs; and developing future plan of care    Ok Edwards NP [facility]  Contact [phone] Monday through Friday 8am- 5pm  After hours call [phone]

## 2015-10-26 ENCOUNTER — Other Ambulatory Visit: Payer: Self-pay

## 2015-10-26 LAB — CBC AND DIFFERENTIAL
HEMATOCRIT: 30 % — AB (ref 41–53)
HEMOGLOBIN: 10.5 g/dL — AB (ref 13.5–17.5)
Platelets: 118 10*3/uL — AB (ref 150–399)
WBC: 5.1 10^3/mL

## 2015-10-26 LAB — BASIC METABOLIC PANEL
BUN: 12 mg/dL (ref 4–21)
Creatinine: 0.9 mg/dL (ref 0.6–1.3)
Glucose: 177 mg/dL
POTASSIUM: 3.6 mmol/L (ref 3.4–5.3)
SODIUM: 144 mmol/L (ref 137–147)

## 2015-10-26 MED ORDER — HYDROCODONE-ACETAMINOPHEN 5-325 MG PO TABS: 1.0000 | ORAL_TABLET | Freq: Four times a day (QID) | ORAL | 0 refills | Status: DC | PRN

## 2015-10-26 NOTE — Telephone Encounter (Signed)
RX faxed to [facility] @ [phone], phone number [phone] 

## 2015-10-30 ENCOUNTER — Non-Acute Institutional Stay (SKILLED_NURSING_FACILITY): Payer: Self-pay | Admitting: Internal Medicine

## 2015-10-30 ENCOUNTER — Encounter: Payer: Self-pay | Admitting: Internal Medicine

## 2015-10-30 DIAGNOSIS — C9001 Multiple myeloma in remission: Secondary | ICD-10-CM

## 2015-10-30 DIAGNOSIS — R569 Unspecified convulsions: Secondary | ICD-10-CM

## 2015-10-30 DIAGNOSIS — F329 Major depressive disorder, single episode, unspecified: Secondary | ICD-10-CM

## 2015-10-30 DIAGNOSIS — I6930 Unspecified sequelae of cerebral infarction: Secondary | ICD-10-CM

## 2015-10-30 DIAGNOSIS — I10 Essential (primary) hypertension: Secondary | ICD-10-CM

## 2015-10-30 DIAGNOSIS — J449 Chronic obstructive pulmonary disease, unspecified: Secondary | ICD-10-CM

## 2015-10-30 DIAGNOSIS — G8929 Other chronic pain: Secondary | ICD-10-CM

## 2015-10-30 DIAGNOSIS — E1151 Type 2 diabetes mellitus with diabetic peripheral angiopathy without gangrene: Secondary | ICD-10-CM

## 2015-10-30 DIAGNOSIS — M545 Low back pain: Secondary | ICD-10-CM

## 2015-10-30 DIAGNOSIS — R531 Weakness: Secondary | ICD-10-CM

## 2015-10-30 DIAGNOSIS — E782 Mixed hyperlipidemia: Secondary | ICD-10-CM

## 2015-10-30 NOTE — Progress Notes (Signed)
Patient ID: Douglas Mccoy, male   DOB: 10-04-1945, 70 y.o.   MRN: [id number]    HISTORY AND PHYSICAL   DATE: 10/30/2015  Location:    [place] Room Number: 127 A Place of Service: SNF (31)   Extended Emergency Contact Information Primary Emergency Contact: Bernerd [place] Phone: [phone] Mobile Phone: [phone] Relation: Sister  Advanced Directive information Does patient have an advance directive?: No, Would patient like information on creating an advanced directive?: No - patient declined information FULL CODE Chief Complaint  Patient presents with  . New Admit To SNF    HPI:  70 yo male seen today as a new admission into SNF following hospital stay for AKI, hyperkalemia, moderate malnutrition, DM2, hypothyroidism, HTN, COPD, multiple myeloma, chronic diarrhea, CAD s/p CABG, hx CVA, sz d/o, metabolic acidosis, memory loss. On admission K+> 7.5 with no ECG changes, Cr 2.71. Renal US showed no hydronephrosis. BP meds adjusted. Negative hepatitis panel. Liver US showed (+) fatty liver. Diarrhea stopped with addition of questran. Hgb 9.4; albumin 3.8; Cr 0.88; CO2 16-->25; Plts 103K; A1c 7.1%; TSH 5.264 ; K  3.5 at d/c. He is a pt at [facility]. He is homeless. He presents to SNF for short term rehab and placement  Today he reports no concerns. He is a poor historian due to memory loss. Hx obtained from chart. No nursing issues or falls. No f/c. No N/V. He has chronic diarrhea  Hx Chronic diarrhea - present x 8 years. Much improved on questran 4 gm daily; imodium 2 mg three times daily as needed; also takes NaHCO3 tabs for metabolic acidosis  History of shingles - stable on acyclovir 200 mg twice daily   Hypertension - BP improved. Takes coreg 12.5 mg twice daily; clonidine 0.1 mg nightly; lisinopril 10 mg daily; ASA 325 mg daily; norvasc to 10 mg daily  COPD - stable on mucinex twice daily as needed; albuterol inhaler 1 or 2 puffs  every 8 hours as needed.   CAD - s/p CABG and angioplasty. no complaint of chest pain present; takes ASA 325 mg daily, coreg and lisinopril  Hx CVA - stable on ASA 325 mg daily   DM - A1c 7.1%. Takes novolog SSI 140-199: 42 units; 200-250: 4 units; 251-299: 6 units; 300-349: 8 units; 350-450: 10 units  Hx Seizure d/o -  None reported. Takes keppra 500 mg twice daily   Dyslipidemia - stable on zocor 10 mg daily  Depression- stable on zoloft 50 mg daily  S/p  AKI with metabolic acidosis - Cr nml. 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 no current treatment at this time. followed by [facility].   Past Medical History:  Diagnosis Date  . CAD (coronary artery disease)   . COPD (chronic obstructive pulmonary disease) ([place])   . Depression   . Diabetes mellitus without complication ([place])   . GERD (gastroesophageal reflux disease)   . Hyperlipidemia   . Hypertension   . Hypothyroidism   . Multiple myeloma ([place])   . Seizures ([place])   . Shingles   . Stroke [facility])     Past Surgical History:  Procedure Laterality Date  . BACK SURGERY     fracture  . BYPASS GRAFT     x4  . CAROTID  ARTERY ANGIOPLASTY     Right carotid artery "cleaned out"  . CHOLECYSTECTOMY      Patient Care Team: [facility] as PCP - General  Social History   Social History  . Marital status: Divorced    Spouse name: N/A  . Number of children: N/A  . Years of education: N/A   Occupational History  . Not on file.   Social History Main Topics  . Smoking status: Former Research scientist (life sciences)  . Smokeless tobacco: Never Used  . Alcohol use No  . Drug use: No  . Sexual activity: Not on file   Other Topics Concern  . Not on file   Social History Narrative  . No narrative on file     reports that he has quit smoking. He  has never used smokeless tobacco. He reports that he does not drink alcohol or use drugs.  Family History  Problem Relation Age of Onset  . Diabetes Mother   . Cancer Neg Hx   . Stroke Neg Hx    Family Status  Relation Status  . Mother Deceased  . Father Deceased  . Neg Hx      There is no immunization history on file for this patient.  Allergies  Allergen Reactions  . Amoxapine And Related Other (See Comments)    unknown  . Augmentin [Amoxicillin-Pot Clavulanate] Other (See Comments)    Unknown reaction Has patient had a PCN reaction causing immediate rash, facial/tongue/throat swelling, SOB or lightheadedness with hypotension: unknown Has patient had a PCN reaction causing severe rash involving mucus membranes or skin necrosis: unknown Has patient had a PCN reaction that required hospitalization: unknown Has patient had a PCN reaction occurring within the last 10 years: no If all of the above answers are "NO", then may proceed with Cephalosporin use.     Medications: Patient's Medications  New Prescriptions   No medications on file  Previous Medications   ACYCLOVIR (ZOVIRAX) 200 MG CAPSULE    Take 200 mg by mouth 2 (two) times daily.   ALBUTEROL (PROVENTIL HFA;VENTOLIN HFA) 108 (90 BASE) MCG/ACT INHALER    Inhale 1-2 puffs into the lungs every 8 (eight) hours as needed for wheezing or shortness of breath.   AMLODIPINE (NORVASC) 10 MG TABLET    Take 10 mg by mouth daily.   ASPIRIN 325 MG TABLET    Take 325 mg by mouth daily.   CARVEDILOL (COREG) 12.5 MG TABLET    Take 1 tablet (12.5 mg total) by mouth 2 (two) times daily with a meal.   CHOLESTYRAMINE LIGHT (PREVALITE) 4 G PACKET    Take 4 g by mouth daily.   CLONIDINE (CATAPRES) 0.1 MG TABLET    Take 1 tablet (0.1 mg total) by mouth at bedtime.   FAMOTIDINE (PEPCID) 20 MG TABLET    Take 1 tablet (20 mg total) by mouth daily.   GUAIFENESIN (MUCINEX) 600 MG 12 HR TABLET    Take 600 mg by mouth 2 (two) times daily as needed  for cough.    IBUPROFEN (ADVIL,MOTRIN) 600 MG TABLET    Take 600 mg by mouth every 8 (eight) hours as needed for moderate pain.    INSULIN ASPART (NOVOLOG) 100 UNIT/ML INJECTION    Inject into the skin. Inject as per sliding scale: if  0-139= 0 ; 140-199 = 2 units, 200-250= 4 units; 251-299= 6 units; 300-349= 8 units; 350-450= 10 units, subcutaneously before meals related to DM. Call MD for CBG less than 60  or greater than 450   LEVETIRACETAM (KEPPRA) 500 MG TABLET    Take 500 mg by mouth 2 (two) times daily.   LEVOTHYROXINE (SYNTHROID) 50 MCG TABLET    Take 1 tablet (50 mcg total) by mouth daily before breakfast.   LISINOPRIL (PRINIVIL,ZESTRIL) 2.5 MG TABLET    Take 2.5 mg by mouth daily.   LOPERAMIDE (IMODIUM) 2 MG CAPSULE    Take 1 capsule (2 mg total) by mouth 3 (three) times daily as needed for diarrhea or loose stools.   SERTRALINE (ZOLOFT) 50 MG TABLET    Take 50 mg by mouth daily.   SIMVASTATIN (ZOCOR) 10 MG TABLET    Take 10 mg by mouth at bedtime.   SODIUM BICARBONATE 650 MG TABLET    Take 1 tablet (650 mg total) by mouth daily.   TIZANIDINE (ZANAFLEX) 4 MG CAPSULE    Take 4 mg by mouth 3 (three) times daily as needed for muscle spasms.  Modified Medications   No medications on file  Discontinued Medications   AMLODIPINE (NORVASC) 5 MG TABLET    Take 1 tablet (5 mg total) by mouth daily.   CHOLESTYRAMINE LIGHT (PREVALITE) 4 G PACKET    Take 1 packet (4 g total) by mouth every Monday, Wednesday, and Friday. Hold if constipation   HYDROCODONE-ACETAMINOPHEN (NORCO/VICODIN) 5-325 MG TABLET    Take 1 tablet by mouth every 6 (six) hours as needed for moderate pain.   LISINOPRIL (ZESTRIL) 10 MG TABLET    Take 1 tablet (10 mg total) by mouth daily.    Review of Systems  Unable to perform ROS: Other (memory loss)    Vitals:   10/30/15 1008  BP: (!) 148/68  Pulse: 62  Resp: 18  Temp: 97.8 F (36.6 C)  TempSrc: Oral  SpO2: 97%  Weight: 139 lb (63 kg)  Height: 5' 5"  (1.651 m)   Body  mass index is 23.13 kg/m.  Physical Exam  Constitutional: He appears well-developed.  Frail appearing in NAD  HENT:  Mouth/Throat: Oropharynx is clear and moist.  MMM; no oral thrush  Eyes: Pupils are equal, round, and reactive to light. No scleral icterus.  Neck: Neck supple. Carotid bruit is not present. No tracheal deviation present. No thyromegaly present.  Cardiovascular: Normal rate, regular rhythm and intact distal pulses.  Exam reveals no gallop and no friction rub.   Murmur (1/6 SEM) heard. no distal LE swelling. No calf TTP  Pulmonary/Chest: Effort normal and breath sounds normal. He has no wheezes. He has no rales. He exhibits no tenderness.  Abdominal: Soft. Bowel sounds are normal. He exhibits no distension, no abdominal bruit, no pulsatile midline mass and no mass. There is no hepatomegaly. There is no tenderness. There is no rebound and no guarding.  Musculoskeletal: He exhibits edema.  Lymphadenopathy:    He has no cervical adenopathy.  Neurological: He is alert. Gait (unsteady gait) abnormal.  Skin: Skin is warm and dry. No rash noted.  Psychiatric: He has a normal mood and affect. His behavior is normal.     Labs reviewed: Nursing Home on 10/30/2015  Component Date Value Ref Range Status  . Hemoglobin 10/26/2015 10.5* 13.5 - 17.5 g/dL Final  . HCT 10/26/2015 30* 41 - 53 % Final  . Platelets 10/26/2015 118* 150 - 399 K/L Final  . WBC 10/26/2015 5.1  10^3/mL Final  . Glucose 10/26/2015 177  mg/dL Final  . BUN 10/26/2015 12  4 - 21 mg/dL Final  . Creatinine 10/26/2015  0.9  0.6 - 1.3 mg/dL Final  . Potassium 10/26/2015 3.6  3.4 - 5.3 mmol/L Final  . Sodium 10/26/2015 144  137 - 147 mmol/L Final  Admission on 10/20/2015, Discharged on 10/24/2015  No results displayed because visit has over 200 results.    Admission on 10/06/2015, Discharged on 10/06/2015  Component Date Value Ref Range Status  . Glucose-Capillary 10/06/2015 400* 65 - 99 mg/dL Final  . WBC  10/06/2015 6.2  4.0 - 10.5 K/uL Final  . RBC 10/06/2015 3.92* 4.22 - 5.81 MIL/uL Final  . Hemoglobin 10/06/2015 10.7* 13.0 - 17.0 g/dL Final  . HCT 10/06/2015 32.1* 39.0 - 52.0 % Final  . MCV 10/06/2015 81.9  78.0 - 100.0 fL Final  . MCH 10/06/2015 27.3  26.0 - 34.0 pg Final  . MCHC 10/06/2015 33.3  30.0 - 36.0 g/dL Final  . RDW 10/06/2015 16.7* 11.5 - 15.5 % Final  . Platelets 10/06/2015 110* 150 - 400 K/uL Final   Comment: SPECIMEN CHECKED FOR CLOTS PLATELET COUNT CONFIRMED BY SMEAR LARGE PLATELETS PRESENT   . Neutrophils Relative % 10/06/2015 75  % Final  . Neutro Abs 10/06/2015 4.7  1.7 - 7.7 K/uL Final  . Lymphocytes Relative 10/06/2015 16  % Final  . Lymphs Abs 10/06/2015 1.0  0.7 - 4.0 K/uL Final  . Monocytes Relative 10/06/2015 8  % Final  . Monocytes Absolute 10/06/2015 0.5  0.1 - 1.0 K/uL Final  . Eosinophils Relative 10/06/2015 0  % Final  . Eosinophils Absolute 10/06/2015 0.0  0.0 - 0.7 K/uL Final  . Basophils Relative 10/06/2015 1  % Final  . Basophils Absolute 10/06/2015 0.0  0.0 - 0.1 K/uL Final  . Sodium 10/06/2015 136  135 - 145 mmol/L Final  . Potassium 10/06/2015 5.9* 3.5 - 5.1 mmol/L Final  . Chloride 10/06/2015 105  101 - 111 mmol/L Final  . CO2 10/06/2015 26  22 - 32 mmol/L Final  . Glucose, Bld 10/06/2015 397* 65 - 99 mg/dL Final  . BUN 10/06/2015 32* 6 - 20 mg/dL Final  . Creatinine, Ser 10/06/2015 1.49* 0.61 - 1.24 mg/dL Final  . Calcium 10/06/2015 9.3  8.9 - 10.3 mg/dL Final  . GFR calc non Af Amer 10/06/2015 45* >60 mL/min Final  . GFR calc Af Amer 10/06/2015 52* >60 mL/min Final   Comment: (NOTE) The eGFR has been calculated using the CKD EPI equation. This calculation has not been validated in all clinical situations. eGFR's persistently <60 mL/min signify possible Chronic Kidney Disease.   . Anion gap 10/06/2015 5  5 - 15 Final  . Color, Urine 10/06/2015 YELLOW  YELLOW Final  . APPearance 10/06/2015 CLEAR  CLEAR Final  . Specific Gravity, Urine  10/06/2015 1.022  1.005 - 1.030 Final  . pH 10/06/2015 5.5  5.0 - 8.0 Final  . Glucose, UA 10/06/2015 >1000* NEGATIVE mg/dL Final  . Hgb urine dipstick 10/06/2015 NEGATIVE  NEGATIVE Final  . Bilirubin Urine 10/06/2015 NEGATIVE  NEGATIVE Final  . Ketones, ur 10/06/2015 NEGATIVE  NEGATIVE mg/dL Final  . Protein, ur 10/06/2015 30* NEGATIVE mg/dL Final  . Nitrite 10/06/2015 NEGATIVE  NEGATIVE Final  . Leukocytes, UA 10/06/2015 NEGATIVE  NEGATIVE Final  . Squamous Epithelial / LPF 10/06/2015 0-5* NONE SEEN Final  . WBC, UA 10/06/2015 0-5  0 - 5 WBC/hpf Final  . RBC / HPF 10/06/2015 NONE SEEN  0 - 5 RBC/hpf Final  . Bacteria, UA 10/06/2015 NONE SEEN  NONE SEEN Final  . Casts 10/06/2015 HYALINE  CASTS* NEGATIVE Final  . Glucose-Capillary 10/06/2015 302* 65 - 99 mg/dL Final    US Abdomen Complete  Result Date: 10/21/2015 CLINICAL DATA:  Elevated liver transaminases and renal insufficiency. EXAM: ABDOMEN ULTRASOUND COMPLETE COMPARISON:  None. FINDINGS: Gallbladder: Surgically absent. Common bile duct: Diameter: Normal caliber of 4 mm. Liver: The liver demonstrates coarse echotexture and increased echogenicity, likely reflecting diffuse steatosis. No overt cirrhotic contour abnormalities or focal lesions are identified. There is no evidence of intrahepatic biliary ductal dilatation. The portal vein is open. IVC: No abnormality visualized. Pancreas: Visualized portion unremarkable. Spleen: Mild splenomegaly with estimated splenic volume of 466 mL. Right Kidney: Length: 11.1 cm. Mild cortical atrophy and mildly increased cortical echogenicity, likely reflecting chronic disease. No hydronephrosis or focal lesion. Left Kidney: Length: 10.9 cm. Cortical atrophy without evidence of hydronephrosis or focal lesion. Abdominal aorta: No aneurysm visualized. Other findings: None. IMPRESSION: 1. Diffuse hepatic steatosis without overt cirrhosis by ultrasound. No focal liver lesions identified. 2. Mild splenomegaly. 3.  Cortical atrophy of both kidneys without evidence of hydronephrosis. Electronically Signed   By: Aletta Edouard M.D.   On: 10/21/2015 15:03     Assessment/Plan   ICD-9-CM ICD-10-CM   1. Generalized weakness 780.79 R53.1   2. Late effect of cerebrovascular accident (CVA) 438.9 I69.30   3. Type II diabetes mellitus with peripheral circulatory disorder (HCC) 250.70 E11.51    443.81    4. Essential hypertension 401.9 I10   5. Chronic obstructive pulmonary disease, unspecified COPD type ([place]) 496 J44.9   6. Chronic bilateral low back pain without sciatica 724.2 M54.5    338.29 G89.29   7. Seizures (HCC) 780.39 R56.9   8. Mixed hyperlipidemia 272.2 E78.2   9. Major depressive disorder with single episode, remission status unspecified 296.20 F32.9   10. Multiple myeloma in remission (HCC) 203.01 C90.01     Repeat TSH in 4-6 weeks due to slight elevation  CBC and BMP already done  Cont current meds as ordered  PT/OT/ST as ordered  [facility]  Fall precautions  GOAL: short term rehab and d/c home when medically appropriate. He is homeless and will need placement. Communicated with pt and nursing.  Will follow  Tauri Ethington S. Perlie Gold  [facility] [address] [place], [place] [address] [phone] Cell (Monday-Friday 8 AM - 5 PM) [phone] After 5 PM and follow prompts

## 2015-10-31 ENCOUNTER — Encounter: Payer: Self-pay | Admitting: Adult Health

## 2015-10-31 ENCOUNTER — Non-Acute Institutional Stay (SKILLED_NURSING_FACILITY): Payer: Self-pay | Admitting: Adult Health

## 2015-10-31 DIAGNOSIS — N179 Acute kidney failure, unspecified: Secondary | ICD-10-CM

## 2015-10-31 DIAGNOSIS — I10 Essential (primary) hypertension: Secondary | ICD-10-CM

## 2015-10-31 DIAGNOSIS — E1151 Type 2 diabetes mellitus with diabetic peripheral angiopathy without gangrene: Secondary | ICD-10-CM

## 2015-10-31 DIAGNOSIS — J449 Chronic obstructive pulmonary disease, unspecified: Secondary | ICD-10-CM

## 2015-10-31 DIAGNOSIS — I6930 Unspecified sequelae of cerebral infarction: Secondary | ICD-10-CM

## 2015-10-31 NOTE — Progress Notes (Signed)
Patient ID: Douglas Mccoy, male   DOB: 1945/08/31, 70 y.o.   MRN: [id number]   Location:   [place] Room Number: 127-A Place of Service:  SNF (31)    CODE STATUS: Full Code  Allergies  Allergen Reactions  . Amoxapine And Related Other (See Comments)    unknown  . Augmentin [Amoxicillin-Pot Clavulanate] Other (See Comments)    Chief Complaint  Patient presents with  . Discharge Note    Discharge from Facility    HPI:  He is being discharged to home with home health for pt/ot/rn. He will not need dme; he has all necessary equipment. He will need his prescriptions to be written and will need to follow up with his pcp.   Past Medical History:  Diagnosis Date  . CAD (coronary artery disease)   . COPD (chronic obstructive pulmonary disease) ([place])   . Depression   . Diabetes mellitus without complication ([place])   . GERD (gastroesophageal reflux disease)   . Hyperlipidemia   . Hypertension   . Hypothyroidism   . Multiple myeloma ([place])   . Seizures ([place])   . Shingles   . Stroke [facility])     Past Surgical History:  Procedure Laterality Date  . BACK SURGERY     fracture  . BYPASS GRAFT     x4  . CAROTID ARTERY ANGIOPLASTY     Right carotid artery "cleaned out"  . CHOLECYSTECTOMY      Social History   Social History  . Marital status: Divorced    Spouse name: N/A  . Number of children: N/A  . Years of education: N/A   Occupational History  . Not on file.   Social History Main Topics  . Smoking status: Former Research scientist (life sciences)  . Smokeless tobacco: Never Used  . Alcohol use No  . Drug use: No  . Sexual activity: Not on file   Other Topics Concern  . Not on file   Social History Narrative  . No narrative on file   Family History  Problem Relation Age of Onset  . Diabetes Mother   . Cancer Neg Hx   . Stroke Neg Hx     VITAL SIGNS BP (!) 185/80   Pulse 68   Temp 97.1 F (36.2 C) (Oral)   Resp 12   Ht 5' 5"  (1.651 m)   Wt 139 lb (63 kg)   SpO2  97%   BMI 23.13 kg/m   Patient's Medications  New Prescriptions   No medications on file  Previous Medications   ACYCLOVIR (ZOVIRAX) 200 MG CAPSULE    Take 200 mg by mouth 2 (two) times daily.   ALBUTEROL (PROVENTIL HFA;VENTOLIN HFA) 108 (90 BASE) MCG/ACT INHALER    Inhale 1-2 puffs into the lungs every 8 (eight) hours as needed for wheezing or shortness of breath.   AMLODIPINE (NORVASC) 10 MG TABLET    Take 10 mg by mouth daily.   ASPIRIN 325 MG TABLET    Take 325 mg by mouth daily.   CARVEDILOL (COREG) 12.5 MG TABLET    Take 1 tablet (12.5 mg total) by mouth 2 (two) times daily with a meal.   CHOLESTYRAMINE LIGHT (PREVALITE) 4 G PACKET    Take 4 g by mouth daily.   CLONIDINE (CATAPRES) 0.1 MG TABLET    Take 1 tablet (0.1 mg total) by mouth at bedtime.   FAMOTIDINE (PEPCID) 20 MG TABLET    Take 1 tablet (20 mg total) by mouth daily.  GUAIFENESIN (MUCINEX) 600 MG 12 HR TABLET    Take 600 mg by mouth 2 (two) times daily as needed for cough.    IBUPROFEN (ADVIL,MOTRIN) 600 MG TABLET    Take 600 mg by mouth every 8 (eight) hours as needed for moderate pain.    INSULIN ASPART (NOVOLOG) 100 UNIT/ML INJECTION    Inject into the skin. Inject as per sliding scale: if  0-139= 0 ; 140-199 = 2 units, 200-250= 4 units; 251-299= 6 units; 300-349= 8 units; 350-450= 10 units, subcutaneously before meals related to DM. Call MD for CBG less than 60 or greater than 450   LEVETIRACETAM (KEPPRA) 500 MG TABLET    Take 500 mg by mouth 2 (two) times daily.   LEVOTHYROXINE (SYNTHROID) 50 MCG TABLET    Take 1 tablet (50 mcg total) by mouth daily before breakfast.   LISINOPRIL (PRINIVIL,ZESTRIL) 2.5 MG TABLET    Take 2.5 mg by mouth daily.   LOPERAMIDE (IMODIUM) 2 MG CAPSULE    Take 1 capsule (2 mg total) by mouth 3 (three) times daily as needed for diarrhea or loose stools.   SERTRALINE (ZOLOFT) 50 MG TABLET    Take 50 mg by mouth daily.   SIMVASTATIN (ZOCOR) 10 MG TABLET    Take 10 mg by mouth at bedtime.   SODIUM  BICARBONATE 650 MG TABLET    Take 1 tablet (650 mg total) by mouth daily.   TIZANIDINE (ZANAFLEX) 4 MG CAPSULE    Take 4 mg by mouth 3 (three) times daily as needed for muscle spasms.  Modified Medications   No medications on file  Discontinued Medications   No medications on file     SIGNIFICANT DIAGNOSTIC EXAMS  10-21-15: abdominal ultrasound: 1. Diffuse hepatic steatosis without overt cirrhosis by ultrasound. No focal liver lesions identified. 2. Mild splenomegaly. 3. Cortical atrophy of both kidneys without evidence of hydronephrosis.    LABS REVIEWED:   10-06-15: wbc 6.2; hgb 10.7; hct 32.1; mcv 81.9; plt 110; glucose 397; bun 32; create 1.49; k+ 5.9; na++ 136 10-20-15; wbc 12.2; hgb 13.1; hct 40.7; mcv 84.4; plt 152; glucose 178; bun 92; creat 2.87; k+ >7.5; na++ 133; total protein 9; ast 56; alt 78; alk phos 87; albumin 4.8 10-21-15: wbc 9.1; hgb 12.6; hct 38.5; mcv 83.2 ;plt 139; glucose 162; bun 83; creat 2.51; k+ 6.1; na++ 137; phos 5.2; mag 2.7; ast 44; alt 71; alk phos 84 albumin 4.7; hgb a1c 7.1; tsh 5.264; free T3: 0.63; free T4: 1.8 10-22-15: wbc 7.2; hgb 10.4; hct 31.3; mcv 82.8 ;plt 134; glucose 98; bun 70; creat 1.83; k+ 4.2; na++141; liver normal albumin 3.8 10-24-15: wbc 3.9; hgb 9.4; hct 28.0; mcv 82.6 ;plt 103; glucose 105; bun 22; creat 0.88; k+ 3.5; na++ 141    Review of Systems  Constitutional: Negative for malaise/fatigue.  Respiratory: Negative for cough and shortness of breath.   Cardiovascular: Negative for chest pain, palpitations and leg swelling.  Gastrointestinal: Positive for diarrhea. Negative for abdominal pain, constipation and heartburn.       Is better since taking medication at hospital   Musculoskeletal: Positive for back pain. Negative for joint pain and myalgias.       Has chronic back pain   Skin: Negative.   Neurological: Negative for dizziness.  Psychiatric/Behavioral: The patient is not nervous/anxious.      Physical Exam    Constitutional: He is oriented to person, place, and time. No distress.  Eyes: Conjunctivae are normal.  Neck:  Neck supple. No JVD present. No thyromegaly present.  Cardiovascular: Normal rate, regular rhythm and intact distal pulses.   Respiratory: Effort normal and breath sounds normal. No respiratory distress. He has no wheezes.  GI: Soft. Bowel sounds are normal. He exhibits no distension. There is no tenderness.  Musculoskeletal: He exhibits no edema.  Able to move all extremities   Lymphadenopathy:    He has no cervical adenopathy.  Neurological: He is alert and oriented to person, place, and time.  Skin: Skin is warm and dry. He is not diaphoretic.  Psychiatric: He has a normal mood and affect.      ASSESSMENT/ PLAN:  Patient is being discharged with the following home health services:  Pt/ot/rn to evaluate and treat as indicated for gait balance strength adl training and medication management   Patient is being discharged with the following durable medical equipment:  None   Patient has been advised to f/u with their PCP in 1-2 weeks to bring them up to date on their rehab stay.  Social services at facility was responsible for arranging this appointment.  Pt was provided with a 30 day supply of prescriptions for medications and refills must be obtained from their PCP.  For controlled substances, a more limited supply may be provided adequate until PCP appointment only.   Time spent with patient  40  minutes >50% time spent counseling; reviewing medical record; tests; labs; and developing future plan of care   Ok Edwards NP [facility]  Contact [phone] Monday through Friday 8am- 5pm  After hours call [phone]

## 2015-12-08 ENCOUNTER — Emergency Department (HOSPITAL_COMMUNITY): Payer: Medicare Other

## 2015-12-08 ENCOUNTER — Encounter (HOSPITAL_COMMUNITY): Payer: Self-pay | Admitting: Emergency Medicine

## 2015-12-08 ENCOUNTER — Emergency Department (HOSPITAL_COMMUNITY)
Admission: EM | Admit: 2015-12-08 | Discharge: 2015-12-08 | Disposition: A | Discharge status: Home / Self Care | Payer: Medicare Other | Attending: Emergency Medicine | Admitting: Emergency Medicine

## 2015-12-08 DIAGNOSIS — E039 Hypothyroidism, unspecified: Secondary | ICD-10-CM | POA: Diagnosis not present

## 2015-12-08 DIAGNOSIS — I77810 Thoracic aortic ectasia: Secondary | ICD-10-CM | POA: Diagnosis not present

## 2015-12-08 DIAGNOSIS — Z951 Presence of aortocoronary bypass graft: Secondary | ICD-10-CM | POA: Diagnosis not present

## 2015-12-08 DIAGNOSIS — Z7982 Long term (current) use of aspirin: Secondary | ICD-10-CM | POA: Insufficient documentation

## 2015-12-08 DIAGNOSIS — E119 Type 2 diabetes mellitus without complications: Secondary | ICD-10-CM | POA: Diagnosis not present

## 2015-12-08 DIAGNOSIS — Z8673 Personal history of transient ischemic attack (TIA), and cerebral infarction without residual deficits: Secondary | ICD-10-CM | POA: Insufficient documentation

## 2015-12-08 DIAGNOSIS — R931 Abnormal findings on diagnostic imaging of heart and coronary circulation: Secondary | ICD-10-CM | POA: Insufficient documentation

## 2015-12-08 DIAGNOSIS — I951 Orthostatic hypotension: Secondary | ICD-10-CM

## 2015-12-08 DIAGNOSIS — Z794 Long term (current) use of insulin: Secondary | ICD-10-CM | POA: Diagnosis not present

## 2015-12-08 DIAGNOSIS — Z79899 Other long term (current) drug therapy: Secondary | ICD-10-CM | POA: Insufficient documentation

## 2015-12-08 DIAGNOSIS — J449 Chronic obstructive pulmonary disease, unspecified: Secondary | ICD-10-CM | POA: Insufficient documentation

## 2015-12-08 DIAGNOSIS — Z87891 Personal history of nicotine dependence: Secondary | ICD-10-CM | POA: Diagnosis not present

## 2015-12-08 DIAGNOSIS — I10 Essential (primary) hypertension: Secondary | ICD-10-CM | POA: Insufficient documentation

## 2015-12-08 DIAGNOSIS — I2510 Atherosclerotic heart disease of native coronary artery without angina pectoris: Secondary | ICD-10-CM | POA: Diagnosis not present

## 2015-12-08 DIAGNOSIS — I959 Hypotension, unspecified: Secondary | ICD-10-CM | POA: Diagnosis present

## 2015-12-08 HISTORY — DX: Stem cells transplant status: Z94.84

## 2015-12-08 LAB — BASIC METABOLIC PANEL
ANION GAP: 7 (ref 5–15)
BUN: 38 mg/dL — ABNORMAL HIGH (ref 6–20)
CALCIUM: 9.4 mg/dL (ref 8.9–10.3)
CO2: 26 mmol/L (ref 22–32)
Chloride: 105 mmol/L (ref 101–111)
Creatinine, Ser: 1.53 mg/dL — ABNORMAL HIGH (ref 0.61–1.24)
GFR, EST AFRICAN AMERICAN: 51 mL/min — AB (ref >60)
GFR, EST NON AFRICAN AMERICAN: 44 mL/min — AB (ref >60)
Glucose, Bld: 137 mg/dL — ABNORMAL HIGH (ref 65–99)
Potassium: 4.8 mmol/L (ref 3.5–5.1)
Sodium: 138 mmol/L (ref 135–145)

## 2015-12-08 LAB — CBC
HCT: 36.1 % — ABNORMAL LOW (ref 39.0–52.0)
HEMOGLOBIN: 11.7 g/dL — AB (ref 13.0–17.0)
MCH: 27.6 pg (ref 26.0–34.0)
MCHC: 32.4 g/dL (ref 30.0–36.0)
MCV: 85.1 fL (ref 78.0–100.0)
Platelets: 124 10*3/uL — ABNORMAL LOW (ref 150–400)
RBC: 4.24 MIL/uL (ref 4.22–5.81)
RDW: 16.9 % — ABNORMAL HIGH (ref 11.5–15.5)
WBC: 7 10*3/uL (ref 4.0–10.5)

## 2015-12-08 LAB — URINALYSIS, ROUTINE W REFLEX MICROSCOPIC
Bilirubin Urine: NEGATIVE
Glucose, UA: NEGATIVE mg/dL
Hgb urine dipstick: NEGATIVE
Ketones, ur: NEGATIVE mg/dL
LEUKOCYTES UA: NEGATIVE
NITRITE: NEGATIVE
Protein, ur: NEGATIVE mg/dL
SPECIFIC GRAVITY, URINE: 1.016 (ref 1.005–1.030)
pH: 5.5 (ref 5.0–8.0)

## 2015-12-08 LAB — CBG MONITORING, ED: GLUCOSE-CAPILLARY: 128 mg/dL — AB (ref 65–99)

## 2015-12-08 LAB — I-STAT TROPONIN, ED: TROPONIN I, POC: 0 ng/mL (ref 0.00–0.08)

## 2015-12-08 NOTE — ED Notes (Signed)
Bed: WA10 Expected date: 12/08/15 Expected time: 1:41 PM Means of arrival: Ambulance Comments: Hypotensive  86/48

## 2015-12-08 NOTE — ED Provider Notes (Signed)
[place] DEPT Provider Note   CSN: [id number] Arrival date & time: 12/08/15  1345     History   Chief Complaint Chief Complaint  Patient presents with  . Hypotension resolved    HPI Douglas Mccoy is a 70 y.o. male.  HPI Patient states he's had low blood pressure in the past 2 days. He reports he's felt a little weak and dizzy but otherwise no additional symptoms. He reports his lisinopril dose was increased about a week ago to 72m. He denies chest pain, palpitation or syncope. No dyspnea or calf pain. No headache. No fever no chills. No nausea no vomiting. Past Medical History:  Diagnosis Date  . CAD (coronary artery disease)   . COPD (chronic obstructive pulmonary disease) ([place]   . Depression   . Diabetes mellitus without complication ([place]   . GERD (gastroesophageal reflux disease)   . [place] of stem cell transplant ([place]   . Hyperlipidemia   . Hypertension   . Hypothyroidism   . Multiple myeloma ([place]   . Multiple myeloma ([place]   . Seizures ([place]   . Shingles   . Stroke ([facility]     Patient Active Problem List   Diagnosis Date Noted  . Essential hypertension, malignant 10/25/2015  . CAD (coronary artery disease) 10/25/2015  . Hx of CABG 10/25/2015  . Metabolic acidosis 135/57/3220 . Chronic bilateral low back pain 10/25/2015  . Depression 10/25/2015  . Late effect of cerebrovascular accident (CVA) 10/25/2015  . Malnutrition of moderate degree 10/22/2015  . Acute renal failure (ARF) ([place] 10/20/2015  . Hyperkalemia 10/20/2015  . Type II diabetes mellitus with peripheral circulatory disorder ([place] 10/20/2015  . Hypertension 10/20/2015  . COPD (chronic obstructive pulmonary disease) ([place] 10/20/2015  . Multiple myeloma ([place] 10/20/2015    Past Surgical History:  Procedure Laterality Date  . BACK SURGERY     fracture  . BYPASS GRAFT     x4  . CAROTID ARTERY ANGIOPLASTY     Right carotid artery "cleaned out"  . CHOLECYSTECTOMY         Home  Medications    Prior to Admission medications   Medication Sig Start Date End Date Taking? Authorizing Provider  acyclovir (ZOVIRAX) 200 MG capsule Take 200 mg by mouth 2 (two) times daily.   Yes Historical Provider, MD  albuterol (PROVENTIL HFA;VENTOLIN HFA) 108 (90 Base) MCG/ACT inhaler Inhale 1-2 puffs into the lungs every 8 (eight) hours as needed for wheezing or shortness of breath.   Yes Historical Provider, MD  amLODipine (NORVASC) 10 MG tablet Take 10 mg by mouth daily.   Yes Historical Provider, MD  aspirin EC 81 MG tablet Take 81 mg by mouth daily.   Yes Historical Provider, MD  carvedilol (COREG) 12.5 MG tablet Take 1 tablet (12.5 mg total) by mouth 2 (two) times daily with a meal. 10/23/15  Yes FFlorencia Reasons MD  cloNIDine (CATAPRES) 0.1 MG tablet Take 1 tablet (0.1 mg total) by mouth at bedtime. 10/23/15  Yes FFlorencia Reasons MD  famotidine (PEPCID) 20 MG tablet Take 1 tablet (20 mg total) by mouth daily. 10/23/15  Yes FFlorencia Reasons MD  gabapentin (NEURONTIN) 300 MG capsule Take 300 mg by mouth 2 (two) times daily.   Yes Historical Provider, MD  insulin aspart (NOVOLOG) 100 UNIT/ML injection Inject into the skin. Inject as per sliding scale: if  0-139= 0 ; 140-199 = 2 units, 200-250= 4 units; 251-299= 6 units; 300-349= 8 units; 350-450= 10 units, subcutaneously before meals related  to DM. Call MD for CBG less than 60 or greater than 450   Yes Historical Provider, MD  levETIRAcetam (KEPPRA) 500 MG tablet Take 500 mg by mouth 2 (two) times daily.   Yes Historical Provider, MD  levothyroxine (SYNTHROID) 50 MCG tablet Take 1 tablet (50 mcg total) by mouth daily before breakfast. 10/23/15  Yes Florencia Reasons, MD  lisinopril (PRINIVIL,ZESTRIL) 2.5 MG tablet Take 2.5 mg by mouth daily.   Yes Historical Provider, MD  loperamide (IMODIUM) 2 MG capsule Take 1 capsule (2 mg total) by mouth 3 (three) times daily as needed for diarrhea or loose stools. 10/23/15  Yes Florencia Reasons, MD  sertraline (ZOLOFT) 50 MG tablet Take 50 mg by  mouth daily.   Yes Historical Provider, MD  simvastatin (ZOCOR) 10 MG tablet Take 10 mg by mouth at bedtime.   Yes Historical Provider, MD  sodium bicarbonate 650 MG tablet Take 1 tablet (650 mg total) by mouth daily. 10/24/15  Yes Florencia Reasons, MD    Family History Family History  Problem Relation Age of Onset  . Diabetes Mother   . Cancer Neg Hx   . Stroke Neg Hx     Social History Social History  Substance Use Topics  . Smoking status: Former Research scientist (life sciences)  . Smokeless tobacco: Never Used  . Alcohol use No     Allergies   Amoxapine and related and Augmentin [amoxicillin-pot clavulanate]   Review of Systems Review of Systems 10 Systems reviewed and are negative for acute change except as noted in the HPI.   Physical Exam Updated Vital Signs BP 135/65   Pulse (!) 59   Temp 97.4 F (36.3 C) (Oral)   Resp 16   SpO2 100%   Physical Exam  Constitutional: He is oriented to person, place, and time. He appears well-developed and well-nourished.  HENT:  Head: Normocephalic and atraumatic.  Mouth/Throat: Oropharynx is clear and moist.  Eyes: Conjunctivae and EOM are normal. Pupils are equal, round, and reactive to light.  Neck: Neck supple.  Cardiovascular: Normal rate, regular rhythm, normal heart sounds and intact distal pulses.   No murmur heard. Pulmonary/Chest: Effort normal and breath sounds normal. No respiratory distress.  Abdominal: Soft. He exhibits no distension. There is no tenderness.  Musculoskeletal: He exhibits no edema or tenderness.  Neurological: He is alert and oriented to person, place, and time. No cranial nerve deficit or sensory deficit. He exhibits normal muscle tone. Coordination normal.  Skin: Skin is warm and dry.  Psychiatric: He has a normal mood and affect.  Nursing note and vitals reviewed.    ED Treatments / Results  Labs (all labs ordered are listed, but only abnormal results are displayed) Labs Reviewed  BASIC METABOLIC PANEL - Abnormal;  Notable for the following:       Result Value   Glucose, Bld 137 (*)    BUN 38 (*)    Creatinine, Ser 1.53 (*)    GFR calc non Af Amer 44 (*)    GFR calc Af Amer 51 (*)    All other components within normal limits  CBC - Abnormal; Notable for the following:    Hemoglobin 11.7 (*)    HCT 36.1 (*)    RDW 16.9 (*)    Platelets 124 (*)    All other components within normal limits  CBG MONITORING, ED - Abnormal; Notable for the following:    Glucose-Capillary 128 (*)    All other components within normal limits  URINALYSIS, ROUTINE  W REFLEX MICROSCOPIC (NOT AT [facility])  I-STAT TROPOININ, ED    EKG  EKG Interpretation  Date/Time:  Friday December 08 2015 14:08:13 EST Ventricular Rate:  59 PR Interval:    QRS Duration: 91 QT Interval:  467 QTC Calculation: 463 R Axis:     Text Interpretation:  Sinus rhythm Low voltage, precordial leads Borderline T abnormalities, inferior leads agree. Confirmed by Johnney Killian, MD, Jeannie Done [phone]) on 12/08/2015 3:17:42 PM       Radiology Dg Chest 2 View  Result Date: 12/08/2015 CLINICAL DATA:  Hypertension. Shortness of breath and dizzy spells. History of COPD and diabetes. EXAM: CHEST  2 VIEW COMPARISON:  None. FINDINGS: Postsurgical changes from CABG. Cardiomediastinal silhouette is normal. Mediastinal contours appear intact. There is fullness of the left hilum. Eventration of the right hemidiaphragm is noted. There is tortuosity and calcific atherosclerotic disease of the thoracic aorta. There is no evidence of focal airspace consolidation, pleural effusion or pneumothorax. Osseous structures are without acute abnormality. Compression deformity post vertebroplasty of T8 vertebral body noted. Soft tissues are grossly normal. IMPRESSION: Fullness of the left hilum. Differential diagnosis includes overlapping vascular shadows, left hilar adenopathy or left hilar mass. Correlation with cross-sectional imaging of the chest will be useful. Calcific  atherosclerotic disease of the aorta. Electronically Signed   By: Fidela Salisbury M.D.   On: 12/08/2015 16:24   Ct Chest Wo Contrast  Result Date: 12/08/2015 CLINICAL DATA:  Left hilar fullness on recent chest x-ray EXAM: CT CHEST WITHOUT CONTRAST TECHNIQUE: Multidetector CT imaging of the chest was performed following the standard protocol without IV contrast. COMPARISON:  Chest film from earlier in the same day FINDINGS: Cardiovascular: Diffuse aortic calcifications are noted. Calcifications in the brachiocephalic vessels are noted as well. Heavy coronary calcifications are seen as well as changes of prior coronary bypass grafting. Mild prominence of the ascending aorta is noted to 4 cm at the level the pulmonary artery. Mediastinum/Nodes: The thoracic inlet is within normal limits. No hilar or mediastinal adenopathy is noted. The fullness seen on the recent chest x-ray is related to a confluence of parenchymal and vascular shadows as opposed to a true mass. Lungs/Pleura: The lungs are well aerated bilaterally with diffuse emphysematous changes. Mild bibasilar atelectatic changes are seen. No focal mass lesion is noted. Upper Abdomen: Visualized upper abdomen shows no acute abnormality. Musculoskeletal: Degenerative changes of the thoracic spine are seen. Changes of prior vertebral augmentation are noted at T8. Chronic compression deformity of T11 is noted. IMPRESSION: Mild prominence of the ascending aorta to 4 cm. Recommend annual imaging followup by CTA or MRA. This recommendation follows 2010 ACCF/AHA/AATS/ACR/ASA/SCA/SCAI/SIR/STS/SVM Guidelines for the Diagnosis and Management of Patients with Thoracic Aortic Disease. Circulation. 2010; 121: Z767-H419 The abnormality seen on recent chest x-ray is not related to the mass but to a confluence of vascular parenchymal shadows. Emphysematous changes. Electronically Signed   By: Inez Catalina M.D.   On: 12/08/2015 17:47    Procedures Procedures  (including critical care time)  Medications Ordered in ED Medications - No data to display   Initial Impression / Assessment and Plan / ED Course  I have reviewed the triage vital signs and the nursing notes.  Pertinent labs & imaging results that were available during my care of the patient were reviewed by me and considered in my medical decision making (see chart for details).  Clinical Course     Final Clinical Impressions(s) / ED Diagnoses   Final diagnoses:  Orthostatic hypotension  Dilation  of thoracic aorta [facility])   Patient had documented orthostatic hypotension at his care facility. He reports he was getting lightheaded but had no syncope. No associated chest pain or palpitations. This is resolved with a small fluid bolus and monitoring. I suspect this is secondary to patient's blood pressure medications. At this time recommendation will be to decrease his lisinopril dose to 20 mg daily. Based on chest x-ray findings, CT scan was obtained. The patient has incidental enlargement of the thoracic aorta without any emergent or urgent findings. Recommendations have been put in discharge summary for surveillance as recommended by radiology. New Prescriptions New Prescriptions   No medications on file     Charlesetta Shanks, MD 12/08/15 1815

## 2015-12-08 NOTE — Discharge Instructions (Signed)
Decreased your daily lisinopril dose to 20 mg. Your CT scan showed some enlargement of your aorta (main blood vessel in your chest). This does not require any emergent treatment. It will have to be monitored regularly. Make your doctor aware of this finding and the need to get records from your emergency department visit.

## 2015-12-08 NOTE — ED Triage Notes (Addendum)
Pt reports he has had problems with fluctuating BP for the past month with hyper and hypotensive episodes. Pt has had BP and other medications adjusted this month. EMS called out for BP of 84/43 today. EMS gave 536ml NS. BP 105/62 during triage. Only complaint was dizziness.

## 2016-01-01 ENCOUNTER — Emergency Department (HOSPITAL_COMMUNITY): Payer: Non-veteran care

## 2016-01-01 ENCOUNTER — Inpatient Hospital Stay (HOSPITAL_COMMUNITY)
Admission: EM | Admit: 2016-01-01 | Discharge: 2016-01-06 | DRG: 683 | Disposition: A | Discharge status: Home / Self Care | Payer: Non-veteran care | Attending: Internal Medicine | Admitting: Internal Medicine

## 2016-01-01 ENCOUNTER — Encounter (HOSPITAL_COMMUNITY): Payer: Self-pay

## 2016-01-01 DIAGNOSIS — R0602 Shortness of breath: Secondary | ICD-10-CM

## 2016-01-01 DIAGNOSIS — N179 Acute kidney failure, unspecified: Secondary | ICD-10-CM | POA: Diagnosis not present

## 2016-01-01 DIAGNOSIS — R531 Weakness: Secondary | ICD-10-CM | POA: Diagnosis not present

## 2016-01-01 DIAGNOSIS — I10 Essential (primary) hypertension: Secondary | ICD-10-CM | POA: Diagnosis present

## 2016-01-01 DIAGNOSIS — D696 Thrombocytopenia, unspecified: Secondary | ICD-10-CM | POA: Diagnosis present

## 2016-01-01 DIAGNOSIS — Z888 Allergy status to other drugs, medicaments and biological substances status: Secondary | ICD-10-CM

## 2016-01-01 DIAGNOSIS — E785 Hyperlipidemia, unspecified: Secondary | ICD-10-CM | POA: Diagnosis present

## 2016-01-01 DIAGNOSIS — W1830XA Fall on same level, unspecified, initial encounter: Secondary | ICD-10-CM | POA: Diagnosis present

## 2016-01-01 DIAGNOSIS — Z881 Allergy status to other antibiotic agents status: Secondary | ICD-10-CM

## 2016-01-01 DIAGNOSIS — D6489 Other specified anemias: Secondary | ICD-10-CM | POA: Diagnosis present

## 2016-01-01 DIAGNOSIS — C9001 Multiple myeloma in remission: Secondary | ICD-10-CM | POA: Diagnosis present

## 2016-01-01 DIAGNOSIS — E039 Hypothyroidism, unspecified: Secondary | ICD-10-CM | POA: Diagnosis present

## 2016-01-01 DIAGNOSIS — Z79899 Other long term (current) drug therapy: Secondary | ICD-10-CM

## 2016-01-01 DIAGNOSIS — E1151 Type 2 diabetes mellitus with diabetic peripheral angiopathy without gangrene: Secondary | ICD-10-CM | POA: Diagnosis present

## 2016-01-01 DIAGNOSIS — I959 Hypotension, unspecified: Secondary | ICD-10-CM

## 2016-01-01 DIAGNOSIS — Z951 Presence of aortocoronary bypass graft: Secondary | ICD-10-CM

## 2016-01-01 DIAGNOSIS — C9000 Multiple myeloma not having achieved remission: Secondary | ICD-10-CM | POA: Diagnosis present

## 2016-01-01 DIAGNOSIS — D649 Anemia, unspecified: Secondary | ICD-10-CM

## 2016-01-01 DIAGNOSIS — G40909 Epilepsy, unspecified, not intractable, without status epilepticus: Secondary | ICD-10-CM | POA: Diagnosis present

## 2016-01-01 DIAGNOSIS — Z87891 Personal history of nicotine dependence: Secondary | ICD-10-CM

## 2016-01-01 DIAGNOSIS — I2510 Atherosclerotic heart disease of native coronary artery without angina pectoris: Secondary | ICD-10-CM | POA: Diagnosis present

## 2016-01-01 DIAGNOSIS — I951 Orthostatic hypotension: Secondary | ICD-10-CM | POA: Diagnosis present

## 2016-01-01 DIAGNOSIS — Z7982 Long term (current) use of aspirin: Secondary | ICD-10-CM

## 2016-01-01 DIAGNOSIS — K219 Gastro-esophageal reflux disease without esophagitis: Secondary | ICD-10-CM | POA: Diagnosis present

## 2016-01-01 DIAGNOSIS — Z9481 Bone marrow transplant status: Secondary | ICD-10-CM

## 2016-01-01 DIAGNOSIS — N289 Disorder of kidney and ureter, unspecified: Secondary | ICD-10-CM | POA: Diagnosis not present

## 2016-01-01 DIAGNOSIS — Z8673 Personal history of transient ischemic attack (TIA), and cerebral infarction without residual deficits: Secondary | ICD-10-CM

## 2016-01-01 DIAGNOSIS — K529 Noninfective gastroenteritis and colitis, unspecified: Secondary | ICD-10-CM | POA: Diagnosis present

## 2016-01-01 DIAGNOSIS — J069 Acute upper respiratory infection, unspecified: Secondary | ICD-10-CM | POA: Diagnosis present

## 2016-01-01 DIAGNOSIS — J441 Chronic obstructive pulmonary disease with (acute) exacerbation: Secondary | ICD-10-CM | POA: Diagnosis present

## 2016-01-01 DIAGNOSIS — E860 Dehydration: Secondary | ICD-10-CM | POA: Diagnosis present

## 2016-01-01 DIAGNOSIS — Z794 Long term (current) use of insulin: Secondary | ICD-10-CM

## 2016-01-01 LAB — COMPREHENSIVE METABOLIC PANEL
ALT: 18 U/L (ref 17–63)
AST: 17 U/L (ref 15–41)
Albumin: 3.9 g/dL (ref 3.5–5.0)
Alkaline Phosphatase: 78 U/L (ref 38–126)
Anion gap: 8 (ref 5–15)
BUN: 48 mg/dL — AB (ref 6–20)
CHLORIDE: 105 mmol/L (ref 101–111)
CO2: 21 mmol/L — AB (ref 22–32)
CREATININE: 2.38 mg/dL — AB (ref 0.61–1.24)
Calcium: 8.4 mg/dL — ABNORMAL LOW (ref 8.9–10.3)
GFR calc Af Amer: 30 mL/min — ABNORMAL LOW (ref >60)
GFR, EST NON AFRICAN AMERICAN: 26 mL/min — AB (ref >60)
Glucose, Bld: 222 mg/dL — ABNORMAL HIGH (ref 65–99)
Potassium: 5 mmol/L (ref 3.5–5.1)
SODIUM: 134 mmol/L — AB (ref 135–145)
Total Bilirubin: 0.8 mg/dL (ref 0.3–1.2)
Total Protein: 7.5 g/dL (ref 6.5–8.1)

## 2016-01-01 LAB — CBC
HCT: 29.3 % — ABNORMAL LOW (ref 39.0–52.0)
HEMATOCRIT: 28.4 % — AB (ref 39.0–52.0)
HEMOGLOBIN: 9.8 g/dL — AB (ref 13.0–17.0)
HEMOGLOBIN: 9.5 g/dL — AB (ref 13.0–17.0)
MCH: 28 pg (ref 26.0–34.0)
MCH: 27.5 pg (ref 26.0–34.0)
MCHC: 33.4 g/dL (ref 30.0–36.0)
MCHC: 33.5 g/dL (ref 30.0–36.0)
MCV: 83.7 fL (ref 78.0–100.0)
MCV: 82.1 fL (ref 78.0–100.0)
PLATELETS: 90 10*3/uL — AB (ref 150–400)
Platelets: 76 10*3/uL — ABNORMAL LOW (ref 150–400)
RBC: 3.5 MIL/uL — AB (ref 4.22–5.81)
RBC: 3.46 MIL/uL — ABNORMAL LOW (ref 4.22–5.81)
RDW: 17.1 % — ABNORMAL HIGH (ref 11.5–15.5)
RDW: 16.9 % — AB (ref 11.5–15.5)
WBC: 11.6 10*3/uL — AB (ref 4.0–10.5)
WBC: 8.2 10*3/uL (ref 4.0–10.5)

## 2016-01-01 LAB — I-STAT CG4 LACTIC ACID, ED: LACTIC ACID, VENOUS: 1.98 mmol/L — AB (ref 0.5–1.9)

## 2016-01-01 LAB — TYPE AND SCREEN
ABO/RH(D): A POS
ANTIBODY SCREEN: NEGATIVE

## 2016-01-01 LAB — PROTIME-INR
INR: 1.18
Prothrombin Time: 15 seconds (ref 11.4–15.2)

## 2016-01-01 LAB — TSH: TSH: 2.521 u[IU]/mL (ref 0.350–4.500)

## 2016-01-01 LAB — GLUCOSE, CAPILLARY: Glucose-Capillary: 173 mg/dL — ABNORMAL HIGH (ref 65–99)

## 2016-01-01 LAB — LACTIC ACID, PLASMA: Lactic Acid, Venous: 0.8 mmol/L (ref 0.5–1.9)

## 2016-01-01 LAB — I-STAT TROPONIN, ED: Troponin i, poc: 0.02 ng/mL (ref 0.00–0.08)

## 2016-01-01 LAB — TROPONIN I

## 2016-01-01 MED ORDER — ONDANSETRON HCL 4 MG/2ML IJ SOLN: 4.0000 mg | Freq: Four times a day (QID) | INTRAMUSCULAR | Status: DC | PRN

## 2016-01-01 MED ORDER — LEVETIRACETAM 500 MG PO TABS
500.0000 mg | ORAL_TABLET | Freq: Two times a day (BID) | ORAL | Status: DC
  Administered 2016-01-01 – 2016-01-06 (×10): 500 mg via ORAL
  Filled 2016-01-01 (×10): qty 1

## 2016-01-01 MED ORDER — ONDANSETRON HCL 4 MG PO TABS: 4.0000 mg | ORAL_TABLET | Freq: Four times a day (QID) | ORAL | Status: DC | PRN

## 2016-01-01 MED ORDER — ACETAMINOPHEN 325 MG PO TABS
650.0000 mg | ORAL_TABLET | Freq: Four times a day (QID) | ORAL | Status: DC | PRN
  Administered 2016-01-01 – 2016-01-02 (×2): 650 mg via ORAL
  Filled 2016-01-01: qty 2

## 2016-01-01 MED ORDER — ACETAMINOPHEN 650 MG RE SUPP: 650.0000 mg | Freq: Four times a day (QID) | RECTAL | Status: DC | PRN

## 2016-01-01 MED ORDER — GABAPENTIN 300 MG PO CAPS
300.0000 mg | ORAL_CAPSULE | Freq: Two times a day (BID) | ORAL | Status: DC
  Administered 2016-01-01 – 2016-01-05 (×8): 300 mg via ORAL
  Filled 2016-01-01 (×9): qty 1

## 2016-01-01 MED ORDER — SODIUM CHLORIDE 0.9 % IV BOLUS (SEPSIS)
1000.0000 mL | Freq: Once | INTRAVENOUS | Status: AC
  Administered 2016-01-01: 1000 mL via INTRAVENOUS

## 2016-01-01 MED ORDER — SODIUM CHLORIDE 0.9 % IV BOLUS (SEPSIS): 500.0000 mL | Freq: Once | INTRAVENOUS | Status: DC

## 2016-01-01 MED ORDER — SODIUM CHLORIDE 0.9 % IV SOLN
INTRAVENOUS | Status: AC
  Administered 2016-01-01 – 2016-01-02 (×2): via INTRAVENOUS

## 2016-01-01 MED ORDER — SERTRALINE HCL 50 MG PO TABS
50.0000 mg | ORAL_TABLET | Freq: Every day | ORAL | Status: DC
  Administered 2016-01-01 – 2016-01-06 (×6): 50 mg via ORAL
  Filled 2016-01-01 (×6): qty 1

## 2016-01-01 MED ORDER — ALBUTEROL SULFATE (2.5 MG/3ML) 0.083% IN NEBU: 3.0000 mL | INHALATION_SOLUTION | Freq: Three times a day (TID) | RESPIRATORY_TRACT | Status: DC | PRN

## 2016-01-01 MED ORDER — LEVOTHYROXINE SODIUM 50 MCG PO TABS
50.0000 ug | ORAL_TABLET | Freq: Every day | ORAL | Status: DC
  Administered 2016-01-02 – 2016-01-06 (×5): 50 ug via ORAL
  Filled 2016-01-01 (×5): qty 1

## 2016-01-01 MED ORDER — SIMVASTATIN 10 MG PO TABS
10.0000 mg | ORAL_TABLET | Freq: Every day | ORAL | Status: DC
  Administered 2016-01-01 – 2016-01-05 (×5): 10 mg via ORAL
  Filled 2016-01-01 (×5): qty 1

## 2016-01-01 MED ORDER — FAMOTIDINE 20 MG PO TABS
20.0000 mg | ORAL_TABLET | Freq: Every day | ORAL | Status: DC
  Administered 2016-01-02 – 2016-01-06 (×5): 20 mg via ORAL
  Filled 2016-01-01 (×8): qty 1

## 2016-01-01 MED ORDER — ACYCLOVIR 200 MG PO CAPS
200.0000 mg | ORAL_CAPSULE | Freq: Two times a day (BID) | ORAL | Status: DC
  Administered 2016-01-01 – 2016-01-06 (×10): 200 mg via ORAL
  Filled 2016-01-01 (×11): qty 1

## 2016-01-01 MED ORDER — INSULIN ASPART 100 UNIT/ML ~~LOC~~ SOLN
0.0000 [IU] | Freq: Three times a day (TID) | SUBCUTANEOUS | Status: DC
  Administered 2016-01-02 (×2): 2 [IU] via SUBCUTANEOUS
  Administered 2016-01-02: 5 [IU] via SUBCUTANEOUS
  Administered 2016-01-03: 2 [IU] via SUBCUTANEOUS
  Administered 2016-01-03 – 2016-01-04 (×2): 3 [IU] via SUBCUTANEOUS
  Administered 2016-01-04 – 2016-01-06 (×8): 2 [IU] via SUBCUTANEOUS

## 2016-01-01 MED ORDER — SODIUM BICARBONATE 650 MG PO TABS
650.0000 mg | ORAL_TABLET | Freq: Every day | ORAL | Status: DC
  Administered 2016-01-01 – 2016-01-06 (×6): 650 mg via ORAL
  Filled 2016-01-01 (×6): qty 1

## 2016-01-01 MED ORDER — SODIUM CHLORIDE 0.9 % IV SOLN
INTRAVENOUS | Status: DC
  Administered 2016-01-01: 19:00:00 via INTRAVENOUS

## 2016-01-01 MED ORDER — CARVEDILOL 12.5 MG PO TABS
12.5000 mg | ORAL_TABLET | Freq: Two times a day (BID) | ORAL | Status: DC
  Administered 2016-01-02 – 2016-01-06 (×10): 12.5 mg via ORAL
  Filled 2016-01-01 (×10): qty 1

## 2016-01-01 NOTE — ED Provider Notes (Signed)
[place] DEPT Provider Note   CSN: [id number] Arrival date & time: 01/01/16  1701     History   Chief Complaint Chief Complaint  Patient presents with  . Hypotension    HPI Douglas Mccoy is a 70 y.o. male.  HPI Patient presents to the emergency room with complaints of low blood pressure. The patient has been having issues with fluctuating blood pressure for the last month or 2. Patient states sometimes he gets very low and at other times a time. He was seen in the emergency room at the end of November for similar episode. Patient was diagnosed with orthostatic hypotension.  They recommended decreasing his blood pressure medications. She states he has seen his primary care doctor and had his blood pressure medications adjusted at that time as well. Denies any trouble with chest pain or shortness of breath. No vomiting or diarrhea. No blood in his stool. He has developed a cough in the last day.  Past Medical History:  Diagnosis Date  . CAD (coronary artery disease)   . COPD (chronic obstructive pulmonary disease) ([place])   . Depression   . Diabetes mellitus without complication ([place])   . GERD (gastroesophageal reflux disease)   . History of stem cell transplant ([place])   . Hyperlipidemia   . Hypertension   . Hypothyroidism   . Multiple myeloma ([place])   . Multiple myeloma ([place])   . Seizures ([place])   . Shingles   . Stroke [facility])     Patient Active Problem List   Diagnosis Date Noted  . Essential hypertension, malignant 10/25/2015  . CAD (coronary artery disease) 10/25/2015  . Hx of CABG 10/25/2015  . Metabolic acidosis 30/86/5784  . Chronic bilateral low back pain 10/25/2015  . Depression 10/25/2015  . Late effect of cerebrovascular accident (CVA) 10/25/2015  . Malnutrition of moderate degree 10/22/2015  . Acute renal failure (ARF) ([place]) 10/20/2015  . Hyperkalemia 10/20/2015  . Type II diabetes mellitus with peripheral circulatory disorder ([place]) 10/20/2015  .  Hypertension 10/20/2015  . COPD (chronic obstructive pulmonary disease) ([place]) 10/20/2015  . Multiple myeloma ([place]) 10/20/2015    Past Surgical History:  Procedure Laterality Date  . BACK SURGERY     fracture  . BYPASS GRAFT     x4  . CAROTID ARTERY ANGIOPLASTY     Right carotid artery "cleaned out"  . CHOLECYSTECTOMY         Home Medications    Prior to Admission medications   Medication Sig Start Date End Date Taking? Authorizing Provider  acyclovir (ZOVIRAX) 200 MG capsule Take 200 mg by mouth 2 (two) times daily.   Yes Historical Provider, MD  albuterol (PROVENTIL HFA;VENTOLIN HFA) 108 (90 Base) MCG/ACT inhaler Inhale 1-2 puffs into the lungs every 8 (eight) hours as needed for wheezing or shortness of breath.   Yes Historical Provider, MD  amLODipine (NORVASC) 10 MG tablet Take 10 mg by mouth daily.   Yes Historical Provider, MD  aspirin EC 81 MG tablet Take 81 mg by mouth daily.   Yes Historical Provider, MD  carvedilol (COREG) 12.5 MG tablet Take 1 tablet (12.5 mg total) by mouth 2 (two) times daily with a meal. 10/23/15  Yes Florencia Reasons, MD  cloNIDine (CATAPRES) 0.1 MG tablet Take 1 tablet (0.1 mg total) by mouth at bedtime. 10/23/15  Yes Florencia Reasons, MD  famotidine (PEPCID) 20 MG tablet Take 1 tablet (20 mg total) by mouth daily. 10/23/15  Yes Florencia Reasons, MD  gabapentin (NEURONTIN) 300  MG capsule Take 300 mg by mouth 2 (two) times daily.   Yes Historical Provider, MD  insulin aspart (NOVOLOG) 100 UNIT/ML injection Inject into the skin. Inject as per sliding scale: if  0-139= 0 ; 140-199 = 2 units, 200-250= 4 units; 251-299= 6 units; 300-349= 8 units; 350-450= 10 units, subcutaneously before meals related to DM. Call MD for CBG less than 60 or greater than 450   Yes Historical Provider, MD  levETIRAcetam (KEPPRA) 500 MG tablet Take 500 mg by mouth 2 (two) times daily.   Yes Historical Provider, MD  levothyroxine (SYNTHROID) 50 MCG tablet Take 1 tablet (50 mcg total) by mouth daily before  breakfast. 10/23/15  Yes Florencia Reasons, MD  lisinopril (PRINIVIL,ZESTRIL) 2.5 MG tablet Take 2.5 mg by mouth daily.   Yes Historical Provider, MD  loperamide (IMODIUM) 2 MG capsule Take 1 capsule (2 mg total) by mouth 3 (three) times daily as needed for diarrhea or loose stools. 10/23/15  Yes Florencia Reasons, MD  sertraline (ZOLOFT) 50 MG tablet Take 50 mg by mouth daily.   Yes Historical Provider, MD  simvastatin (ZOCOR) 10 MG tablet Take 10 mg by mouth at bedtime.   Yes Historical Provider, MD  sodium bicarbonate 650 MG tablet Take 1 tablet (650 mg total) by mouth daily. 10/24/15  Yes Florencia Reasons, MD    Family History Family History  Problem Relation Age of Onset  . Diabetes Mother   . Cancer Neg Hx   . Stroke Neg Hx     Social History Social History  Substance Use Topics  . Smoking status: Former Research scientist (life sciences)  . Smokeless tobacco: Never Used  . Alcohol use No     Allergies   Amoxapine and related and Augmentin [amoxicillin-pot clavulanate]   Review of Systems Review of Systems  All other systems reviewed and are negative.    Physical Exam Updated Vital Signs BP 166/72   Pulse 81   Temp 97.5 F (36.4 C) (Oral)   Resp 24   Ht 5' 5"  (1.651 m)   Wt 65.8 kg   SpO2 94%   BMI 24.13 kg/m   Physical Exam  Constitutional: He appears well-developed and well-nourished. No distress.  HENT:  Head: Normocephalic and atraumatic.  Right Ear: External ear normal.  Left Ear: External ear normal.  Eyes: Conjunctivae are normal. Right eye exhibits no discharge. Left eye exhibits no discharge. No scleral icterus.  Neck: Neck supple. No tracheal deviation present.  Cardiovascular: Normal rate, regular rhythm and intact distal pulses.   Pulmonary/Chest: Effort normal and breath sounds normal. No stridor. No respiratory distress. He has no wheezes. He has no rales.  Abdominal: Soft. Bowel sounds are normal. He exhibits no distension. There is no tenderness. There is no rebound and no guarding.    Musculoskeletal: He exhibits no edema or tenderness.  Neurological: He is alert. He has normal strength. No cranial nerve deficit (no facial droop, extraocular movements intact, no slurred speech) or sensory deficit. He exhibits normal muscle tone. He displays no seizure activity. Coordination normal.  Skin: Skin is warm and dry. No rash noted.  Psychiatric: He has a normal mood and affect.  Nursing note and vitals reviewed.    ED Treatments / Results  Labs (all labs ordered are listed, but only abnormal results are displayed) Labs Reviewed  CBC - Abnormal; Notable for the following:       Result Value   WBC 11.6 (*)    RBC 3.50 (*)  Hemoglobin 9.8 (*)    HCT 29.3 (*)    RDW 17.1 (*)    Platelets 90 (*)    All other components within normal limits  COMPREHENSIVE METABOLIC PANEL - Abnormal; Notable for the following:    Sodium 134 (*)    CO2 21 (*)    Glucose, Bld 222 (*)    BUN 48 (*)    Creatinine, Ser 2.38 (*)    Calcium 8.4 (*)    GFR calc non Af Amer 26 (*)    GFR calc Af Amer 30 (*)    All other components within normal limits  I-STAT CG4 LACTIC ACID, ED - Abnormal; Notable for the following:    Lactic Acid, Venous 1.98 (*)    All other components within normal limits  PROTIME-INR  I-STAT TROPOININ, ED  POC OCCULT BLOOD, ED    EKG  EKG Interpretation  Date/Time:  Monday January 01 2016 17:20:52 EST Ventricular Rate:  70 PR Interval:    QRS Duration: 90 QT Interval:  406 QTC Calculation: 439 R Axis:   9 Text Interpretation:  Sinus rhythm Poor data quality No significant change since last tracing Confirmed by Zenaya Ulatowski  MD-J, Talma Aguillard ([id number]) on 01/01/2016 5:23:29 PM       Radiology Dg Chest Portable 1 View  Result Date: 01/01/2016 CLINICAL DATA:  70 year old male with issues with low blood pressure for about 3 days. Normally takes high blood pressure medication. Diabetic. Prior history smoking. Initial encounter. EXAM: PORTABLE CHEST 1 VIEW COMPARISON:   12/08/2015 CT and chest x-ray. FINDINGS: Chronic elevated right hemidiaphragm. Central pulmonary vascular prominence stable without pulmonary edema, segmental infiltrate or pneumothorax. Calcified tortuous aorta.  Please see CT report recommendations. Heart size top-normal.  Coronary artery calcifications. T8 compression fracture treated with cement augmentation. IMPRESSION: No acute pulmonary abnormality. Calcified tortuous aorta. Please see recent CT report recommendations regarding follow-up. If there is any clinical suspicion of aortic abnormality contributing to low blood pressure then CT imaging would be necessary for further delineation. By plain film, findings appear stable. Electronically Signed   By: Genia Del M.D.   On: 01/01/2016 18:37    Procedures Procedures (including critical care time)  Medications Ordered in ED Medications  sodium chloride 0.9 % bolus 1,000 mL (0 mLs Intravenous Stopped 01/01/16 1901)    And  0.9 %  sodium chloride infusion ( Intravenous New Bag/Given 01/01/16 1902)  sodium chloride 0.9 % bolus 500 mL (not administered)     Initial Impression / Assessment and Plan / ED Course  I have reviewed the triage vital signs and the nursing notes.  Pertinent labs & imaging results that were available during my care of the patient were reviewed by me and considered in my medical decision making (see chart for details).  Clinical Course as of Jan 01 1948  Mon Jan 01, 2016  1948 Chest x-ray findings reviewed. Patient denies any chest pain or shortness of breath. I do not feel he requires repeat CT imaging  [JK]    Clinical Course User Index [JK] Dorie Rank, MD   Patient presented to the emergency room with weakness and recurrent hypotension. Patient's blood pressure has improved while he was in the emergency room. Orthostatic vital signs are negative.  Patient's laboratory tests are notable for acute renal insufficiency. His creatinine is elevated compared to  previous labs within the last month.  The patient's hemoglobin has also decreased.  I performed a rectal exam and have ordered a fecal occult blood  test.  Because of his worsening anemia and renal insufficiency. I will consult with medical service for overnight observation, IV hydration and serial blood testing.   Final Clinical Impressions(s) / ED Diagnoses   Final diagnoses:  Acute renal insufficiency  Anemia, unspecified type  Hypotension, unspecified hypotension type    New Prescriptions New Prescriptions   No medications on file     Dorie Rank, MD 01/01/16 1949

## 2016-01-01 NOTE — ED Notes (Signed)
Bed: WA03 Expected date:  Expected time:  Means of arrival:  Comments: [place]

## 2016-01-01 NOTE — H&P (Signed)
History and Physical    Douglas Mccoy XIP:[id number] DOB: 17-Mar-1945 DOA: 01/01/2016  PCP: [facility]  Patient coming from: Home. Patient lives in a shelter.  Chief Complaint: Weakness.  HPI: Douglas Mccoy is a 70 y.o. male with hypertension, COPD, diabetes mellitus, multiple myeloma in remission presents to the ER because of weakness. Patient states over the last 2 days he has been feeling weak and early in the morning while walking to the bathroom patient felt weak and fell. Denies hitting his head or losing consciousness. He states he had been lying on the floor at least for an hour or 2. Later he regained strength and went back to the bed. He was shopping in the evening at [facility] when he felt very weak and went back home. After reaching home since his weakness persisted he called EMS. In the ER patient was found to be hypotensive and was given fluid bolus. In addition labs show worsening renal function and anemia with mild worsening from previous. Patient otherwise denies any chest pain shortness of breath. Has chronic diarrhea.   ED Course: Fluid bolus was given.  Review of Systems: As per HPI, rest all negative.   Past Medical History:  Diagnosis Date  . CAD (coronary artery disease)   . COPD (chronic obstructive pulmonary disease) ([place])   . Depression   . Diabetes mellitus without complication ([place])   . GERD (gastroesophageal reflux disease)   . History of stem cell transplant ([place])   . Hyperlipidemia   . Hypertension   . Hypothyroidism   . Multiple myeloma ([place])   . Multiple myeloma ([place])   . Seizures ([place])   . Shingles   . Stroke [facility])     Past Surgical History:  Procedure Laterality Date  . BACK SURGERY     fracture  . BYPASS GRAFT     x4  . CAROTID ARTERY ANGIOPLASTY     Right carotid artery "cleaned out"  . CHOLECYSTECTOMY       reports that he has quit smoking. He has never used smokeless tobacco. He reports that he does not drink alcohol or  use drugs.  Allergies  Allergen Reactions  . Amoxapine And Related Other (See Comments)    unknown  . Augmentin [Amoxicillin-Pot Clavulanate] Other (See Comments)    Unknown reaction Has patient had a PCN reaction causing immediate rash, facial/tongue/throat swelling, SOB or lightheadedness with hypotension: unknown Has patient had a PCN reaction causing severe rash involving mucus membranes or skin necrosis: unknown Has patient had a PCN reaction that required hospitalization: unknown Has patient had a PCN reaction occurring within the last 10 years: no If all of the above answers are "NO", then may proceed with Cephalosporin use.     Family History  Problem Relation Age of Onset  . Diabetes Mother   . Cancer Neg Hx   . Stroke Neg Hx     Prior to Admission medications   Medication Sig Start Date End Date Taking? Authorizing Provider  acyclovir (ZOVIRAX) 200 MG capsule Take 200 mg by mouth 2 (two) times daily.   Yes Historical Provider, MD  albuterol (PROVENTIL HFA;VENTOLIN HFA) 108 (90 Base) MCG/ACT inhaler Inhale 1-2 puffs into the lungs every 8 (eight) hours as needed for wheezing or shortness of breath.   Yes Historical Provider, MD  amLODipine (NORVASC) 10 MG tablet Take 10 mg by mouth daily.   Yes Historical Provider, MD  aspirin EC 81 MG tablet Take 81 mg by mouth daily.  Yes Historical Provider, MD  carvedilol (COREG) 12.5 MG tablet Take 1 tablet (12.5 mg total) by mouth 2 (two) times daily with a meal. 10/23/15  Yes Florencia Reasons, MD  cloNIDine (CATAPRES) 0.1 MG tablet Take 1 tablet (0.1 mg total) by mouth at bedtime. 10/23/15  Yes Florencia Reasons, MD  famotidine (PEPCID) 20 MG tablet Take 1 tablet (20 mg total) by mouth daily. 10/23/15  Yes Florencia Reasons, MD  gabapentin (NEURONTIN) 300 MG capsule Take 300 mg by mouth 2 (two) times daily.   Yes Historical Provider, MD  insulin aspart (NOVOLOG) 100 UNIT/ML injection Inject into the skin. Inject as per sliding scale: if  0-139= 0 ; 140-199 = 2  units, 200-250= 4 units; 251-299= 6 units; 300-349= 8 units; 350-450= 10 units, subcutaneously before meals related to DM. Call MD for CBG less than 60 or greater than 450   Yes Historical Provider, MD  levETIRAcetam (KEPPRA) 500 MG tablet Take 500 mg by mouth 2 (two) times daily.   Yes Historical Provider, MD  levothyroxine (SYNTHROID) 50 MCG tablet Take 1 tablet (50 mcg total) by mouth daily before breakfast. 10/23/15  Yes Florencia Reasons, MD  lisinopril (PRINIVIL,ZESTRIL) 2.5 MG tablet Take 2.5 mg by mouth daily.   Yes Historical Provider, MD  loperamide (IMODIUM) 2 MG capsule Take 1 capsule (2 mg total) by mouth 3 (three) times daily as needed for diarrhea or loose stools. 10/23/15  Yes Florencia Reasons, MD  sertraline (ZOLOFT) 50 MG tablet Take 50 mg by mouth daily.   Yes Historical Provider, MD  simvastatin (ZOCOR) 10 MG tablet Take 10 mg by mouth at bedtime.   Yes Historical Provider, MD  sodium bicarbonate 650 MG tablet Take 1 tablet (650 mg total) by mouth daily. 10/24/15  Yes Florencia Reasons, MD    Physical Exam: Vitals:   01/01/16 1810 01/01/16 1900 01/01/16 2030 01/01/16 2039  BP: (S) 123/58 166/72 169/83 169/83  Pulse: (S) 72 81 87 84  Resp: 20 24 (!) 27 18  Temp:      TempSrc:      SpO2: 99% 94% 94% (!) 88%  Weight:      Height:          Constitutional: Moderately built and nourished. Vitals:   01/01/16 1810 01/01/16 1900 01/01/16 2030 01/01/16 2039  BP: (S) 123/58 166/72 169/83 169/83  Pulse: (S) 72 81 87 84  Resp: 20 24 (!) 27 18  Temp:      TempSrc:      SpO2: 99% 94% 94% (!) 88%  Weight:      Height:       Eyes: Anicteric no pallor. ENMT: No discharge from the ears eyes nose or mouth. Neck: No mass felt. No neck rigidity. Respiratory: No rhonchi or crepitations. Cardiovascular: S1 and S2 heard. Abdomen: Soft nontender bowel sounds present. Musculoskeletal: No edema. No joint effusion. Skin: No rash. Skin appears warm. Neurologic: Alert awake oriented to time place and person. Most  all extremities. Psychiatric: Appears normal. Normal affect.   Labs on Admission: I have personally reviewed following labs and imaging studies  CBC:  Recent Labs Lab 01/01/16 1724  WBC 11.6*  HGB 9.8*  HCT 29.3*  MCV 83.7  PLT 90*   Basic Metabolic Panel:  Recent Labs Lab 01/01/16 1724  NA 134*  K 5.0  CL 105  CO2 21*  GLUCOSE 222*  BUN 48*  CREATININE 2.38*  CALCIUM 8.4*   GFR: Estimated Creatinine Clearance: 24.4 mL/min (by C-G formula  based on SCr of 2.38 mg/dL (H)). Liver Function Tests:  Recent Labs Lab 01/01/16 1724  AST 17  ALT 18  ALKPHOS 78  BILITOT 0.8  PROT 7.5  ALBUMIN 3.9   No results for input(s): LIPASE, AMYLASE in the last 168 hours. No results for input(s): AMMONIA in the last 168 hours. Coagulation Profile:  Recent Labs Lab 01/01/16 1724  INR 1.18   Cardiac Enzymes: No results for input(s): CKTOTAL, CKMB, CKMBINDEX, TROPONINI in the last 168 hours. BNP (last 3 results) No results for input(s): PROBNP in the last 8760 hours. HbA1C: No results for input(s): HGBA1C in the last 72 hours. CBG: No results for input(s): GLUCAP in the last 168 hours. Lipid Profile: No results for input(s): CHOL, HDL, LDLCALC, TRIG, CHOLHDL, LDLDIRECT in the last 72 hours. Thyroid Function Tests: No results for input(s): TSH, T4TOTAL, FREET4, T3FREE, THYROIDAB in the last 72 hours. Anemia Panel: No results for input(s): VITAMINB12, FOLATE, FERRITIN, TIBC, IRON, RETICCTPCT in the last 72 hours. Urine analysis:    Component Value Date/Time   COLORURINE YELLOW 12/08/2015 [place] 12/08/2015 1740   LABSPEC 1.016 12/08/2015 1740   PHURINE 5.5 12/08/2015 1740   GLUCOSEU NEGATIVE 12/08/2015 1740   HGBUR NEGATIVE 12/08/2015 1740   BILIRUBINUR NEGATIVE 12/08/2015 1740   KETONESUR NEGATIVE 12/08/2015 1740   PROTEINUR NEGATIVE 12/08/2015 1740   NITRITE NEGATIVE 12/08/2015 1740   LEUKOCYTESUR NEGATIVE 12/08/2015 1740   Sepsis  Labs: _0 (procalcitonin:4,lacticidven:4) )No results found for this or any previous visit (from the past 240 hour(s)).   Radiological Exams on Admission: Dg Chest Portable 1 View  Result Date: 01/01/2016 CLINICAL DATA:  70 year old male with issues with low blood pressure for about 3 days. Normally takes high blood pressure medication. Diabetic. Prior history smoking. Initial encounter. EXAM: PORTABLE CHEST 1 VIEW COMPARISON:  12/08/2015 CT and chest x-ray. FINDINGS: Chronic elevated right hemidiaphragm. Central pulmonary vascular prominence stable without pulmonary edema, segmental infiltrate or pneumothorax. Calcified tortuous aorta.  Please see CT report recommendations. Heart size top-normal.  Coronary artery calcifications. T8 compression fracture treated with cement augmentation. IMPRESSION: No acute pulmonary abnormality. Calcified tortuous aorta. Please see recent CT report recommendations regarding follow-up. If there is any clinical suspicion of aortic abnormality contributing to low blood pressure then CT imaging would be necessary for further delineation. By plain film, findings appear stable. Electronically Signed   By: Genia Del M.D.   On: 01/01/2016 18:37    EKG: Independently reviewed. Normal sinus rhythm.  Assessment/Plan Principal Problem:   Generalized weakness Active Problems:   Acute renal failure (ARF) (HCC)   Type II diabetes mellitus with peripheral circulatory disorder (HCC)   Hypertension   Multiple myeloma (HCC)   Hx of CABG   Dehydration   Weakness generalized    1. Generalized weakness probably secondary to hypotension - patient has history of orthostatic hypotension. For now we will hold antihypertensives and gently hydrate and check cortisol levels and get physical therapy consult. 2. Acute renal failure probably from hypotension and dehydration - gently hydrate and follow metabolic panel. Hold lisinopril. 3. Anemia and thrombocytopenia - has  chronic anemia. Hemoglobin is at baseline when compared to October this year but in November patient had a little bit higher reading. Follow CBC, stool for occult blood. 4. CAD status post CABG - denies any chest pain. On aspirin and statin. 5. History of multiple myeloma in remission. 6. Diabetes mellitus type 2 on insulin sliding scale coverage. 7. History of seizures on  Keppra. Denies any seizure-like activity. 8. Hypothyroidism on Synthroid. 9. Chronic diarrhea - check HIV status.   DVT prophylaxis: SCDs. Code Status: Full code.  Family Communication: Discussed with patient.  Disposition Plan: To be determined.  Consults called: Physical therapy. Admission status: Observation.    Rise Patience MD [facility] Pager [phone].  If 7PM-7AM, please contact night-coverage [email or web address] Password [email or web address]  01/01/2016, 9:15 PM

## 2016-01-01 NOTE — ED Notes (Signed)
Phone report called to RN

## 2016-01-01 NOTE — ED Triage Notes (Signed)
Per EMS- Patient resides at the [facility]. Patient c/o hypotension. Sitting BP-98/50 and standing BP-86/40. Last BP prior to arrival to the ED was 80/40. Patient received NS 500 ml IV prior to arrival to the ED.

## 2016-01-01 NOTE — ED Notes (Signed)
Attempted to call floor for report, no answer.

## 2016-01-02 DIAGNOSIS — R05 Cough: Secondary | ICD-10-CM | POA: Diagnosis not present

## 2016-01-02 DIAGNOSIS — E860 Dehydration: Secondary | ICD-10-CM | POA: Diagnosis present

## 2016-01-02 DIAGNOSIS — Z87891 Personal history of nicotine dependence: Secondary | ICD-10-CM | POA: Diagnosis not present

## 2016-01-02 DIAGNOSIS — Z7982 Long term (current) use of aspirin: Secondary | ICD-10-CM | POA: Diagnosis not present

## 2016-01-02 DIAGNOSIS — N179 Acute kidney failure, unspecified: Secondary | ICD-10-CM | POA: Diagnosis present

## 2016-01-02 DIAGNOSIS — R531 Weakness: Secondary | ICD-10-CM | POA: Diagnosis not present

## 2016-01-02 DIAGNOSIS — D696 Thrombocytopenia, unspecified: Secondary | ICD-10-CM | POA: Diagnosis present

## 2016-01-02 DIAGNOSIS — C9001 Multiple myeloma in remission: Secondary | ICD-10-CM | POA: Diagnosis present

## 2016-01-02 DIAGNOSIS — K529 Noninfective gastroenteritis and colitis, unspecified: Secondary | ICD-10-CM | POA: Diagnosis present

## 2016-01-02 DIAGNOSIS — N289 Disorder of kidney and ureter, unspecified: Secondary | ICD-10-CM | POA: Diagnosis present

## 2016-01-02 DIAGNOSIS — E785 Hyperlipidemia, unspecified: Secondary | ICD-10-CM | POA: Diagnosis present

## 2016-01-02 DIAGNOSIS — D6489 Other specified anemias: Secondary | ICD-10-CM | POA: Diagnosis present

## 2016-01-02 DIAGNOSIS — E1151 Type 2 diabetes mellitus with diabetic peripheral angiopathy without gangrene: Secondary | ICD-10-CM | POA: Diagnosis present

## 2016-01-02 DIAGNOSIS — W1830XA Fall on same level, unspecified, initial encounter: Secondary | ICD-10-CM | POA: Diagnosis present

## 2016-01-02 DIAGNOSIS — Z881 Allergy status to other antibiotic agents status: Secondary | ICD-10-CM | POA: Diagnosis not present

## 2016-01-02 DIAGNOSIS — Z79899 Other long term (current) drug therapy: Secondary | ICD-10-CM | POA: Diagnosis not present

## 2016-01-02 DIAGNOSIS — C9000 Multiple myeloma not having achieved remission: Secondary | ICD-10-CM | POA: Diagnosis not present

## 2016-01-02 DIAGNOSIS — Z9481 Bone marrow transplant status: Secondary | ICD-10-CM | POA: Diagnosis not present

## 2016-01-02 DIAGNOSIS — I951 Orthostatic hypotension: Secondary | ICD-10-CM | POA: Diagnosis present

## 2016-01-02 DIAGNOSIS — K219 Gastro-esophageal reflux disease without esophagitis: Secondary | ICD-10-CM | POA: Diagnosis present

## 2016-01-02 DIAGNOSIS — Z951 Presence of aortocoronary bypass graft: Secondary | ICD-10-CM | POA: Diagnosis not present

## 2016-01-02 DIAGNOSIS — I2510 Atherosclerotic heart disease of native coronary artery without angina pectoris: Secondary | ICD-10-CM | POA: Diagnosis present

## 2016-01-02 DIAGNOSIS — E039 Hypothyroidism, unspecified: Secondary | ICD-10-CM | POA: Diagnosis present

## 2016-01-02 DIAGNOSIS — Z888 Allergy status to other drugs, medicaments and biological substances status: Secondary | ICD-10-CM | POA: Diagnosis not present

## 2016-01-02 DIAGNOSIS — Z8673 Personal history of transient ischemic attack (TIA), and cerebral infarction without residual deficits: Secondary | ICD-10-CM | POA: Diagnosis not present

## 2016-01-02 DIAGNOSIS — I10 Essential (primary) hypertension: Secondary | ICD-10-CM | POA: Diagnosis present

## 2016-01-02 DIAGNOSIS — J069 Acute upper respiratory infection, unspecified: Secondary | ICD-10-CM | POA: Diagnosis present

## 2016-01-02 DIAGNOSIS — Z794 Long term (current) use of insulin: Secondary | ICD-10-CM | POA: Diagnosis not present

## 2016-01-02 DIAGNOSIS — J441 Chronic obstructive pulmonary disease with (acute) exacerbation: Secondary | ICD-10-CM | POA: Diagnosis present

## 2016-01-02 DIAGNOSIS — E871 Hypo-osmolality and hyponatremia: Secondary | ICD-10-CM | POA: Diagnosis not present

## 2016-01-02 LAB — BASIC METABOLIC PANEL
ANION GAP: 6 (ref 5–15)
BUN: 35 mg/dL — ABNORMAL HIGH (ref 6–20)
CALCIUM: 8.4 mg/dL — AB (ref 8.9–10.3)
CO2: 22 mmol/L (ref 22–32)
Chloride: 112 mmol/L — ABNORMAL HIGH (ref 101–111)
Creatinine, Ser: 1.52 mg/dL — ABNORMAL HIGH (ref 0.61–1.24)
GFR, EST AFRICAN AMERICAN: 51 mL/min — AB (ref >60)
GFR, EST NON AFRICAN AMERICAN: 44 mL/min — AB (ref >60)
GLUCOSE: 170 mg/dL — AB (ref 65–99)
POTASSIUM: 4.7 mmol/L (ref 3.5–5.1)
Sodium: 140 mmol/L (ref 135–145)

## 2016-01-02 LAB — RESPIRATORY PANEL BY PCR
ADENOVIRUS-RVPPCR: NOT DETECTED
BORDETELLA PERTUSSIS-RVPCR: NOT DETECTED
CHLAMYDOPHILA PNEUMONIAE-RVPPCR: NOT DETECTED
CORONAVIRUS NL63-RVPPCR: NOT DETECTED
Coronavirus 229E: NOT DETECTED
Coronavirus HKU1: NOT DETECTED
Coronavirus OC43: NOT DETECTED
INFLUENZA A-RVPPCR: NOT DETECTED
Influenza B: NOT DETECTED
METAPNEUMOVIRUS-RVPPCR: DETECTED — AB
Mycoplasma pneumoniae: NOT DETECTED
PARAINFLUENZA VIRUS 4-RVPPCR: NOT DETECTED
Parainfluenza Virus 1: NOT DETECTED
Parainfluenza Virus 2: NOT DETECTED
Parainfluenza Virus 3: NOT DETECTED
RHINOVIRUS / ENTEROVIRUS - RVPPCR: NOT DETECTED
Respiratory Syncytial Virus: NOT DETECTED

## 2016-01-02 LAB — CBC
HEMATOCRIT: 29 % — AB (ref 39.0–52.0)
HEMOGLOBIN: 9.7 g/dL — AB (ref 13.0–17.0)
MCH: 27.5 pg (ref 26.0–34.0)
MCHC: 33.4 g/dL (ref 30.0–36.0)
MCV: 82.2 fL (ref 78.0–100.0)
Platelets: 84 10*3/uL — ABNORMAL LOW (ref 150–400)
RBC: 3.53 MIL/uL — AB (ref 4.22–5.81)
RDW: 16.8 % — ABNORMAL HIGH (ref 11.5–15.5)
WBC: 7.3 10*3/uL (ref 4.0–10.5)

## 2016-01-02 LAB — URINALYSIS, ROUTINE W REFLEX MICROSCOPIC
Bacteria, UA: NONE SEEN
Bilirubin Urine: NEGATIVE
Glucose, UA: >=500 mg/dL — AB
Ketones, ur: NEGATIVE mg/dL
Leukocytes, UA: NEGATIVE
NITRITE: NEGATIVE
Protein, ur: 30 mg/dL — AB
SPECIFIC GRAVITY, URINE: 1.012 (ref 1.005–1.030)
pH: 5 (ref 5.0–8.0)

## 2016-01-02 LAB — GLUCOSE, CAPILLARY
GLUCOSE-CAPILLARY: 299 mg/dL — AB (ref 65–99)
Glucose-Capillary: 160 mg/dL — ABNORMAL HIGH (ref 65–99)
Glucose-Capillary: 170 mg/dL — ABNORMAL HIGH (ref 65–99)

## 2016-01-02 LAB — CORTISOL: Cortisol, Plasma: 24.7 ug/dL

## 2016-01-02 LAB — ABO/RH: ABO/RH(D): A POS

## 2016-01-02 LAB — HIV ANTIBODY (ROUTINE TESTING W REFLEX): HIV SCREEN 4TH GENERATION: NONREACTIVE

## 2016-01-02 LAB — MRSA PCR SCREENING: MRSA BY PCR: NEGATIVE

## 2016-01-02 MED ORDER — GUAIFENESIN ER 600 MG PO TB12
600.0000 mg | ORAL_TABLET | Freq: Two times a day (BID) | ORAL | Status: DC
  Administered 2016-01-02 – 2016-01-06 (×9): 600 mg via ORAL
  Filled 2016-01-02 (×9): qty 1

## 2016-01-02 MED ORDER — DOXYCYCLINE HYCLATE 100 MG PO TABS
100.0000 mg | ORAL_TABLET | Freq: Two times a day (BID) | ORAL | Status: DC
  Administered 2016-01-02 – 2016-01-06 (×9): 100 mg via ORAL
  Filled 2016-01-02 (×9): qty 1

## 2016-01-02 MED ORDER — LOPERAMIDE HCL 2 MG PO CAPS
2.0000 mg | ORAL_CAPSULE | Freq: Three times a day (TID) | ORAL | Status: DC | PRN
  Administered 2016-01-02 – 2016-01-05 (×4): 2 mg via ORAL
  Filled 2016-01-02 (×5): qty 1

## 2016-01-02 MED ORDER — METHYLPREDNISOLONE SODIUM SUCC 125 MG IJ SOLR
60.0000 mg | INTRAMUSCULAR | Status: DC
  Administered 2016-01-02 – 2016-01-05 (×4): 60 mg via INTRAVENOUS
  Filled 2016-01-02 (×5): qty 2

## 2016-01-02 MED ORDER — ALBUTEROL SULFATE (2.5 MG/3ML) 0.083% IN NEBU: 3.0000 mL | INHALATION_SOLUTION | RESPIRATORY_TRACT | Status: DC | PRN

## 2016-01-02 MED ORDER — IPRATROPIUM-ALBUTEROL 0.5-2.5 (3) MG/3ML IN SOLN
3.0000 mL | Freq: Two times a day (BID) | RESPIRATORY_TRACT | Status: DC
  Administered 2016-01-03: 3 mL via RESPIRATORY_TRACT
  Filled 2016-01-02: qty 3

## 2016-01-02 MED ORDER — IPRATROPIUM-ALBUTEROL 0.5-2.5 (3) MG/3ML IN SOLN
3.0000 mL | Freq: Three times a day (TID) | RESPIRATORY_TRACT | Status: DC
  Administered 2016-01-02: 3 mL via RESPIRATORY_TRACT
  Filled 2016-01-02: qty 3

## 2016-01-02 MED ORDER — FLUTICASONE PROPIONATE 50 MCG/ACT NA SUSP
2.0000 | Freq: Every day | NASAL | Status: DC
  Administered 2016-01-02 – 2016-01-06 (×5): 2 via NASAL
  Filled 2016-01-02: qty 16

## 2016-01-02 NOTE — Clinical Social Work Note (Signed)
Clinical Social Work Assessment  Patient Details  Name: Douglas Mccoy MRN: [id number] Date of Birth: 31-Oct-1945  Date of referral:  01/02/16               Reason for consult:  Facility Placement, Discharge Planning                Permission sought to share information with:  Facility Art therapist granted to share information::  Yes, Verbal Permission Granted  Name::        Agency::     Relationship::     Contact Information:     Housing/Transportation Living arrangements for the past 2 months:  Homeless Shelter, [place] [facility] ) Source of Information:  Patient Patient Interpreter Needed:  None Criminal Activity/Legal Involvement Pertinent to Current Situation/Hospitalization:    Significant Relationships:  None Lives with:  Self Do you feel safe going back to the place where you live?  Yes Need for family participation in patient care:  Yes (Comment)  Care giving concerns:  Patient stated he had no concerns or questions at this time.    Social Worker assessment / plan:  CSW spoke with patient regarding discharge plans. Patient is currently living at [facility], [phone], where he has been staying for 3 months. The [facility] is transitional housing for homeless veterans and offers 3 meals a day/ transportation to medical appointments and job interviews. CSW spoke with patient regarding PT recommendation of SNF and patient stated he would like to return to [facility] however he agreed for CSW to look into SNF as back-up. CSW explained Medicare's 3-night qualifying stay to patient. CSW attempted to contact the [place] to get more information regarding [facility] but was unable to speak to a representative. CSW spoke with a case manager from [facility] and they are able to pick up patient once stable for discharge.   Employment status:  Retired Forensic scientist:  Information systems manager, [place] Benefit PT Recommendations:  [place] / Referral to community resources:     Patient/Family's Response to care:  Patient appreciated CSW.   Patient/Family's Understanding of and Emotional Response to Diagnosis, Current Treatment, and Prognosis:  Patient understood current treatment and prognosis.   Emotional Assessment Appearance:  Appears stated age Attitude/Demeanor/Rapport:    Affect (typically observed):  Appropriate, Pleasant, Calm Orientation:  Oriented to Self, Oriented to Place, Oriented to  Time, Oriented to Situation Alcohol / Substance use:    Psych involvement (Current and /or in the community):  No (Comment)  Discharge Needs  Concerns to be addressed:  Discharge Planning Concerns Readmission within the last 30 days:  No Current discharge risk:  None Barriers to Discharge:  No Barriers Identified   Weston Anna, LCSW 01/02/2016, 3:07 PM

## 2016-01-02 NOTE — Progress Notes (Signed)
PROGRESS NOTE  Douglas Mccoy NUU:[id number] DOB: [birth year] DOA: 01/01/2016 PCP: [facility]   Brief Summary: Douglas Mccoy is a 70 y.o. male with hypertension, COPD, diabetes mellitus, multiple myeloma in remission presents to the ER because of weakness. Patient states over the last 2 days he has been feeling weak and early in the morning while walking to the bathroom patient felt weak and fell. Denies hitting his head or losing consciousness. He states he had been lying on the floor at least for an hour or 2. Later he regained strength and went back to the bed. He was shopping in the evening at [facility] when he felt very weak and went back home. After reaching home since his weakness persisted he called EMS. In the ER patient was found to be hypotensive and was given fluid bolus. In addition labs show worsening renal function and anemia with mild worsening from previous. Patient otherwise denies any chest pain shortness of breath. Has chronic diarrhea.    HPI/Recap of past 24 hours:  Not feeling better, c/o continue to feel weak, report cough and wheezing  Assessment/Plan: Principal Problem:   Generalized weakness Active Problems:   Acute renal failure (ARF) (HCC)   Type II diabetes mellitus with peripheral circulatory disorder (HCC)   Hypertension   Multiple myeloma ([place])   Hx of CABG   Dehydration   Weakness generalized  Acute renal failure(ARF) ([place]) - renal US in October no obstructive nephropathy, ua pending, cr improving on ivf, hold lisinopril,  renal dosing meds,  Repeat bmp at hospital follow up.   Weakness, fall, hypotension Per patient" Patient states over the last 2 days he has been feeling weak and early in the morning while walking to the bathroom patient felt weak and fell. Denies hitting his head or losing consciousness. He states he had been lying on the floor at least for an hour or 2. Later he regained strength and went back to the bed. He was  shopping in the evening at [facility] when he felt very weak and went back home. After reaching home since his weakness persisted he called EMS. In the ER patient was found to be hypotensive and was given fluid bolus" Am cortisol level wnl (tested prior to starting steroid) Home bp meds lisinopril , clonidine, norvasc held, he is continued on coreg PT/SNF   Cough/running nose and wheezing/copd exacerbation: he reports cough started three days ago, he is uptodate with flu vaccine, he is a past smoker,  cxr no infiltrate, no fever, no leukocytosis Respiratory panel ordered Start solumedrol/flonase/mucinex/nebs/doxycycline ( to cover possible bronchitis)  Thrombocytopenia/anemia:  hgb seems to close to baseline, plt low from acute URI? Continue monitor, on scd  Will get spep /upep due to h/o multiple myeloma     Multiple myeloma (HCC)s/p bone marrow transplant at [facility]. patient states currently in remission ,followed by [facility], on acyclovir for herpes suppression chronically per patient Spep/upep ordered.  Chronic diarrhea, report since after BMT.   Coronary artery disease , CAD sp CABG with perioperative CVA in 2004, currently stable, no chest pain ,no acute EKG changes, continue aspirin, coreg, statin  Borderline diabetes (new diagnosis), a1c 7, on ssi here.  History CVA x2 with left finger numbness, poor memories, currently stable continue aspirin/statin History of seizure disorder - continue Keppra, last seizure one and half month ago Impaired memory at baseline, know the year/month, know he is in the hospital, not able to name the hospital.   Hypothyroidism:tsh 2.5, continue  Synthroid.     DVT prophylaxis while in the hospital:SCD  Code Status:FULL CODE as per patient   Family Communication:Family not at Bedside  [facility]   Consults called:none   Procedures:  none  Antibiotics:   Doxycycline from 12/19   Objective: BP (!) 125/59 (BP Location: Left Arm)   Pulse 85   Temp 99 F (37.2 C) (Oral)   Resp 20   Ht 5' 5"  (1.651 m)   Wt 65.8 kg (145 lb)   SpO2 94%   BMI 24.13 kg/m   Intake/Output Summary (Last 24 hours) at 01/02/16 1219 Last data filed at 01/02/16 0845  Gross per 24 hour  Intake          1676.25 ml  Output              425 ml  Net          1251.25 ml   Filed Weights   01/01/16 1755  Weight: 65.8 kg (145 lb)    Exam:   General:  Frail, NAD  Cardiovascular: RRR  Respiratory: scattered wheezing bilateral lung fields  Abdomen: Soft/ND/NT, positive BS  Musculoskeletal: No Edema  Neuro: aaox3  Data Reviewed: Basic Metabolic Panel:  Recent Labs Lab 01/01/16 1724 01/02/16 0548  NA 134* 140  K 5.0 4.7  CL 105 112*  CO2 21* 22  GLUCOSE 222* 170*  BUN 48* 35*  CREATININE 2.38* 1.52*  CALCIUM 8.4* 8.4*   Liver Function Tests:  Recent Labs Lab 01/01/16 1724  AST 17  ALT 18  ALKPHOS 78  BILITOT 0.8  PROT 7.5  ALBUMIN 3.9   No results for input(s): LIPASE, AMYLASE in the last 168 hours. No results for input(s): AMMONIA in the last 168 hours. CBC:  Recent Labs Lab 01/01/16 1724 01/01/16 2129 01/02/16 0548  WBC 11.6* 8.2 7.3  HGB 9.8* 9.5* 9.7*  HCT 29.3* 28.4* 29.0*  MCV 83.7 82.1 82.2  PLT 90* 76* 84*   Cardiac Enzymes:    Recent Labs Lab 01/01/16 2129  TROPONINI <0.03   BNP (last 3 results) No results for input(s): BNP in the last 8760 hours.  ProBNP (last 3 results) No results for input(s): PROBNP in the last 8760 hours.  CBG:  Recent Labs Lab 01/01/16 2210 01/02/16 0738 01/02/16 1159  GLUCAP 173* 160* 170*    Recent Results (from the past 240 hour(s))  MRSA PCR Screening     Status: None   Collection Time: 01/02/16  6:31 AM  Result Value Ref Range Status   MRSA by PCR NEGATIVE NEGATIVE Final     Comment:        The GeneXpert MRSA Assay (FDA approved for NASAL specimens only), is one component of a comprehensive MRSA colonization surveillance program. It is not intended to diagnose MRSA infection nor to guide or monitor treatment for MRSA infections.      Studies: Dg Chest Portable 1 View  Result Date: 01/01/2016 CLINICAL DATA:  70 year old male with issues with low blood pressure for about 3 days. Normally takes high blood pressure medication. Diabetic. Prior history smoking. Initial encounter. EXAM: PORTABLE CHEST 1 VIEW COMPARISON:  12/08/2015 CT and chest x-ray. FINDINGS: Chronic elevated right hemidiaphragm. Central pulmonary vascular prominence stable without pulmonary edema, segmental infiltrate or pneumothorax. Calcified tortuous aorta.  Please see CT report recommendations. Heart size top-normal.  Coronary artery calcifications. T8 compression fracture treated with cement augmentation. IMPRESSION: No acute pulmonary  abnormality. Calcified tortuous aorta. Please see recent CT report recommendations regarding follow-up. If there is any clinical suspicion of aortic abnormality contributing to low blood pressure then CT imaging would be necessary for further delineation. By plain film, findings appear stable. Electronically Signed   By: Genia Del M.D.   On: 01/01/2016 18:37    Scheduled Meds: . acyclovir  200 mg Oral BID  . carvedilol  12.5 mg Oral BID WC  . doxycycline  100 mg Oral Q12H  . famotidine  20 mg Oral Daily  . fluticasone  2 spray Each Nare Daily  . gabapentin  300 mg Oral BID  . guaiFENesin  600 mg Oral BID  . insulin aspart  0-9 Units Subcutaneous TID WC  . ipratropium-albuterol  3 mL Nebulization Q8H  . levETIRAcetam  500 mg Oral BID  . levothyroxine  50 mcg Oral QAC breakfast  . methylPREDNISolone (SOLU-MEDROL) injection  60 mg Intravenous Q24H  . sertraline  50 mg Oral Daily  . simvastatin  10 mg Oral QHS  . sodium bicarbonate  650 mg Oral Daily     Continuous Infusions: . sodium chloride 75 mL/hr at 01/02/16 1212     Time spent: 89mns  Rosmery Duggin MD, PhD  [facility] Pager [phone] If 7PM-7AM, please contact night-coverage at [email or web address], password [email or web address]12/19/2017, 12:19 PM  LOS: 0 days

## 2016-01-02 NOTE — Evaluation (Signed)
Physical Therapy Evaluation Patient Details Name: Douglas Mccoy MRN: [id number] DOB: 11/07/1945 Today's Date: 01/02/2016   History of Present Illness  70 yo male admitted with generalized weakness, fall, acute renal failure. Hx of COPD, DM, HTN, CVA, multiple myeloma, Sz. Pt is from a group home.  Clinical Impression  On eval, pt required Min assist for mobility. He walked ~100 feet with a RW. Dyspnea 2/4 with activity. Pt presents with general weakness, decreased activity tolerance, and impaired gait and balance. Pt fatigues fairly easily with activity. He remains at risk for falls. Discussed d/c plan-pt reports he plans to return to group home however he does not feel he could manage well in his current state. Recommend ST rehab at SNF at this time. Will continue to follow and progress activity as tolerated. Pt would need to be mobilizing at a supervision level in order to safely return to group home.     Follow Up Recommendations SNF    Equipment Recommendations  Rolling walker with 5" wheels    Recommendations for Other Services OT consult     Precautions / Restrictions Precautions Precautions: Fall Restrictions Weight Bearing Restrictions: No      Mobility  Bed Mobility Overal bed mobility: Needs Assistance Bed Mobility: Supine to Sit     Supine to sit: Min guard     General bed mobility comments: for safety. Increased time.   Transfers Overall transfer level: Needs assistance Equipment used: Rolling walker (2 wheeled) Transfers: Sit to/from Stand Sit to Stand: Min assist         General transfer comment: Assist to rise, stabilize, control descent. VCs safety, hand placement  Ambulation/Gait Ambulation/Gait assistance: Min assist Ambulation Distance (Feet): 100 Feet Assistive device: Rolling walker (2 wheeled) Gait Pattern/deviations: Step-through pattern;Decreased stride length     General Gait Details: Unsteady. Dyspnea 2/4. Pt fatigues fairly easily.  Assist needed to stabilize throughout ambulation distance.   Stairs            Wheelchair Mobility    Modified Rankin (Stroke Patients Only)       Balance Overall balance assessment: Needs assistance           Standing balance-Leahy Scale: Poor Standing balance comment: requiring RW                             Pertinent Vitals/Pain Pain Assessment: No/denies pain    Home Living Family/patient expects to be discharged to:: Group home                      Prior Function Level of Independence: Independent               Hand Dominance        Extremity/Trunk Assessment   Upper Extremity Assessment Upper Extremity Assessment: Generalized weakness    Lower Extremity Assessment Lower Extremity Assessment: Generalized weakness    Cervical / Trunk Assessment Cervical / Trunk Assessment: Normal  Communication   Communication: No difficulties  Cognition Arousal/Alertness: Awake/alert Behavior During Therapy: WFL for tasks assessed/performed Overall Cognitive Status: Within Functional Limits for tasks assessed                      General Comments      Exercises     Assessment/Plan    PT Assessment Patient needs continued PT services  PT Problem List Decreased strength;Decreased mobility;Decreased activity tolerance;Decreased balance;Decreased knowledge of use of DME            PT Treatment Interventions DME instruction;Gait training;Therapeutic activities;Therapeutic exercise;Patient/family education;Functional mobility training;Balance training    PT Goals (Current goals can be found in the Care Plan section)  Acute Rehab PT Goals Patient Stated Goal: to get better, stronger. regain PLOF.  PT Goal Formulation: With patient Time For Goal Achievement: 01/16/16 Potential to Achieve Goals: Good    Frequency Min 3X/week   Barriers to discharge        Co-evaluation               End of Session Equipment  Utilized During Treatment: Gait belt Activity Tolerance: Patient limited by fatigue Patient left: in bed;with call bell/phone within reach;with bed alarm set      Functional Assessment Tool Used: clinical judgement Functional Limitation: Mobility: Walking and moving around Mobility: Walking and Moving Around Current Status (G8978): At least 1 percent but less than 20 percent impaired, limited or restricted Mobility: Walking and Moving Around Goal Status (G8979): At least 1 percent but less than 20 percent impaired, limited or restricted    Time: 1010-1025 PT Time Calculation (min) (ACUTE ONLY): 15 min   Charges:   PT Evaluation $PT Eval Low Complexity: 1 Procedure     PT G Codes:   PT G-Codes **NOT FOR INPATIENT CLASS** Functional Assessment Tool Used: clinical judgement Functional Limitation: Mobility: Walking and moving around Mobility: Walking and Moving Around Current Status (G8978): At least 1 percent but less than 20 percent impaired, limited or restricted Mobility: Walking and Moving Around Goal Status (G8979): At least 1 percent but less than 20 percent impaired, limited or restricted    Jannie Porter, MPT Pager: [phone]    

## 2016-01-03 DIAGNOSIS — E860 Dehydration: Secondary | ICD-10-CM

## 2016-01-03 DIAGNOSIS — Z951 Presence of aortocoronary bypass graft: Secondary | ICD-10-CM

## 2016-01-03 DIAGNOSIS — E1151 Type 2 diabetes mellitus with diabetic peripheral angiopathy without gangrene: Secondary | ICD-10-CM

## 2016-01-03 DIAGNOSIS — C9001 Multiple myeloma in remission: Secondary | ICD-10-CM

## 2016-01-03 DIAGNOSIS — J441 Chronic obstructive pulmonary disease with (acute) exacerbation: Secondary | ICD-10-CM

## 2016-01-03 DIAGNOSIS — R05 Cough: Secondary | ICD-10-CM

## 2016-01-03 DIAGNOSIS — I10 Essential (primary) hypertension: Secondary | ICD-10-CM

## 2016-01-03 LAB — CBC WITH DIFFERENTIAL/PLATELET
BASOS ABS: 0 10*3/uL (ref 0.0–0.1)
BASOS PCT: 0 %
EOS PCT: 0 %
Eosinophils Absolute: 0 10*3/uL (ref 0.0–0.7)
HCT: 29.9 % — ABNORMAL LOW (ref 39.0–52.0)
Hemoglobin: 10 g/dL — ABNORMAL LOW (ref 13.0–17.0)
LYMPHS PCT: 11 %
Lymphs Abs: 0.5 10*3/uL — ABNORMAL LOW (ref 0.7–4.0)
MCH: 27.7 pg (ref 26.0–34.0)
MCHC: 33.4 g/dL (ref 30.0–36.0)
MCV: 82.8 fL (ref 78.0–100.0)
Monocytes Absolute: 0.5 10*3/uL (ref 0.1–1.0)
Monocytes Relative: 10 %
NEUTROS ABS: 4 10*3/uL (ref 1.7–7.7)
Neutrophils Relative %: 79 %
PLATELETS: 94 10*3/uL — AB (ref 150–400)
RBC: 3.61 MIL/uL — AB (ref 4.22–5.81)
RDW: 16.3 % — AB (ref 11.5–15.5)
WBC: 5.1 10*3/uL (ref 4.0–10.5)

## 2016-01-03 LAB — BASIC METABOLIC PANEL
ANION GAP: 8 (ref 5–15)
BUN: 26 mg/dL — ABNORMAL HIGH (ref 6–20)
CALCIUM: 8.6 mg/dL — AB (ref 8.9–10.3)
CO2: 20 mmol/L — ABNORMAL LOW (ref 22–32)
Chloride: 109 mmol/L (ref 101–111)
Creatinine, Ser: 1.15 mg/dL (ref 0.61–1.24)
Glucose, Bld: 219 mg/dL — ABNORMAL HIGH (ref 65–99)
POTASSIUM: 4.9 mmol/L (ref 3.5–5.1)
Sodium: 137 mmol/L (ref 135–145)

## 2016-01-03 LAB — MAGNESIUM: MAGNESIUM: 1.6 mg/dL — AB (ref 1.7–2.4)

## 2016-01-03 LAB — GLUCOSE, CAPILLARY
GLUCOSE-CAPILLARY: 183 mg/dL — AB (ref 65–99)
GLUCOSE-CAPILLARY: 169 mg/dL — AB (ref 65–99)
GLUCOSE-CAPILLARY: 229 mg/dL — AB (ref 65–99)
Glucose-Capillary: 324 mg/dL — ABNORMAL HIGH (ref 65–99)

## 2016-01-03 LAB — URINE CULTURE

## 2016-01-03 MED ORDER — SODIUM CHLORIDE 3 % IN NEBU
4.0000 mL | INHALATION_SOLUTION | Freq: Every day | RESPIRATORY_TRACT | Status: AC
  Administered 2016-01-03: 14:00:00 via RESPIRATORY_TRACT
  Administered 2016-01-04 – 2016-01-05 (×2): 4 mL via RESPIRATORY_TRACT
  Filled 2016-01-03: qty 4
  Filled 2016-01-03 (×2): qty 15

## 2016-01-03 MED ORDER — IPRATROPIUM-ALBUTEROL 0.5-2.5 (3) MG/3ML IN SOLN
3.0000 mL | Freq: Four times a day (QID) | RESPIRATORY_TRACT | Status: DC
  Administered 2016-01-03 (×2): 3 mL via RESPIRATORY_TRACT
  Filled 2016-01-03 (×2): qty 3

## 2016-01-03 MED ORDER — INSULIN ASPART 100 UNIT/ML ~~LOC~~ SOLN
5.0000 [IU] | Freq: Once | SUBCUTANEOUS | Status: AC
  Administered 2016-01-03: 5 [IU] via SUBCUTANEOUS

## 2016-01-03 MED ORDER — IPRATROPIUM-ALBUTEROL 0.5-2.5 (3) MG/3ML IN SOLN
3.0000 mL | Freq: Three times a day (TID) | RESPIRATORY_TRACT | Status: DC
  Administered 2016-01-04 (×3): 3 mL via RESPIRATORY_TRACT
  Filled 2016-01-03 (×3): qty 3

## 2016-01-03 MED ORDER — GUAIFENESIN-DM 100-10 MG/5ML PO SYRP
5.0000 mL | ORAL_SOLUTION | ORAL | Status: DC | PRN
  Administered 2016-01-03 – 2016-01-06 (×7): 5 mL via ORAL
  Filled 2016-01-03 (×7): qty 10

## 2016-01-03 NOTE — Progress Notes (Signed)
PROGRESS NOTE    Douglas Mccoy  HXT:[id number] DOB: January 21, 1945 DOA: 01/01/2016 PCP: [facility]  Brief Narrative:  Delmar Landau a 70 y.o.malewith hypertension, COPD, diabetes mellitus, multiple myeloma in remission presents to the ER because of weakness. Patient states over the last 2 days he has been feeling weak and early in the morning while walking to the bathroom patient felt weak and fell. Denies hitting his head or losing consciousness. He states he had been lying on the floor at least for an hour or 2. Later he [place] and went back to the bed. He was shopping in the evening at [facility] when he felt very weak and went back home. After reaching home since his weakness persisted he called EMS. In the ER patient was found to be hypotensive and was given fluid bolus. In addition labs show worsening renal function and anemia with mild worsening from previous. Patient otherwise denies any chest pain shortness of breath. Has chronic diarrhea.Also admits to Cough which has worsened. Admitted for weakness and Hypotension and now being treated for Cough/Wheezing.   Assessment & Plan:   Principal Problem:   Generalized weakness Active Problems:   Acute renal failure (ARF) (HCC)   Type II diabetes mellitus with peripheral circulatory disorder (HCC)   Hypertension   Multiple myeloma (HCC)   Hx of CABG   Dehydration   Weakness generalized  Acute Kidney Injury -Renal US in October no Obstructive nephropathy,  -Urinalysis Negative  -BUN/Cr improved to 26/1.15 -cr improving on IVF, Continue to hold lisinopril,  -Avoid Nephrotoxics -Repeat CMP in AM  Weakness, fall, hypotension -S/p Fluid Boluses -Am cortisol level wnl (tested prior to starting steroid) -Home bp meds Lisinopril, Clonidine, Norvasc held, he is continued on coreg -PT updated Recc's and recommend going back to Group Home with Supervision  Cough/running nose and wheezing/COPD exacerbation:  -he  reports cough started three days ago, he is uptodate with flu vaccine, he is a past smoker,  -CXR no infiltrate, no fever, no leukocytosis Respiratory panel Positive for Metapneumovirus -C/w solumedrol/flonase/mucinex/nebs/doxycycline ( to cover possible bronchitis) -Changed Nebs to DuoNeb q6h and added Hypertonic Saline Nebs -Repeat CXR in AM  Thrombocytopenia/Anemia:  -hgb seems to close to baseline, plt low at 94 - from acute URI? Continue monitor, on scd  -Will get spep /upep due to h/o multiple myeloma - Repeat CBC in AM  Multiple myeloma (HCC)s/p bone marrow transplant at [facility].  -patient states currently in remission ,followed by [facility], on acyclovir for herpes suppression chronically per patient Spep/upep ordered and pending result  Chronic Diarrhea -C/w Loperamide 2 mg po TIDprn Diarrhea or Loose Stools.   CAD sp CABG with perioperative CVA in 2004,  -Currently stable. -No chest pain, no acute EKG changes,  -continue aspirin, coreg, statin  Borderline Diabetes (new diagnosis) -Hemoglobin A1c 7,  -C/w Sensitive Novolog SSI Ac. -CBG's ranging from 169-229 -May Increase SSI dose as patient is on IV Steroids  History CVA x2 with left finger numbness, poor memories  -Currently stable. -Continue Aspirin and Simvastatin 10 mg po qHS  History of seizure disorder  - Continue Keppra 500 mg po BID,  - Last seizure one and half month ago - Impaired memory at baseline, know the year/month, know he is in the hospital, not able to name the hospital.  Hypothyroidism: -TSH was 2.521 -C/w Synthroid 50 mcg Daily  DVT prophylaxis: SCD's Code Status: FULL Family Communication: No Family Present at Bedside Disposition Plan: [facility] when medically Stable  Consultants:   None  Procedures: 24 Hour Urine for SPEP/UPEP   Antimicrobials: Doxycycline from 01/02/2016 -->  Subjective: Seen and examined and stated weakness was better but his biggest  complaint was his non-productive cough. Denied any CP and had mild SOB. No N/V. Denied abdominal Pain and had no other complaints. Got lightheaded with coughing.   Objective: Vitals:   01/03/16 0900 01/03/16 1008 01/03/16 1332 01/03/16 1530  BP:    (!) 155/71  Pulse:   82 79  Resp:   17 20  Temp:    99.8 F (37.7 C)  TempSrc:    Oral  SpO2:  95% 95% 95%  Weight: 68.8 kg (151 lb 10.8 oz)     Height:        Intake/Output Summary (Last 24 hours) at 01/03/16 1717 Last data filed at 01/03/16 1100  Gross per 24 hour  Intake                0 ml  Output             1100 ml  Net            -1100 ml   Filed Weights   01/01/16 1755 01/03/16 0900  Weight: 65.8 kg (145 lb) 68.8 kg (151 lb 10.8 oz)    Examination: Physical Exam:  Constitutional: WN/WD, NAD and appears calm and comfortable Eyes: Lids and conjunctivae normal, sclerae anicteric  ENMT: External Ears, Nose appear normal. Grossly normal hearing. Neck: Appears normal, supple, no cervical masses, normal ROM, no appreciable thyromegaly Respiratory: Diminished but CTAB. No Rhonchi but slight wheezing bilaterally. Patient not tachypenic or using accessory muscles to breathe. Cardiovascular: RRR, no murmurs / rubs / gallops. S1 and S2 auscultated. No extremity edema.  Abdomen: Soft, non-tender, non-distended. No masses palpated. No appreciable hepatosplenomegaly. Bowel sounds positive x4.  GU: Deferred. Musculoskeletal: No clubbing / cyanosis of digits/nails. No joint deformity upper and lower extremities. Mild tenderness on Right from neuropathy. Skin: No rashes, lesions, ulcers. No induration; Warm and dry.  Neurologic: CN 2-12 grossly intact with no focal deficits. Sensation intact in all 4 Extremities. Romberg sign cerebellar reflexes not assessed.  Psychiatric: Normal judgment and insight. Alert and oriented x 3. Normal mood and appropriate affect.   Data Reviewed: I have personally reviewed following labs and imaging  studies  CBC:  Recent Labs Lab 01/01/16 1724 01/01/16 2129 01/02/16 0548 01/03/16 0602  WBC 11.6* 8.2 7.3 5.1  NEUTROABS  --   --   --  4.0  HGB 9.8* 9.5* 9.7* 10.0*  HCT 29.3* 28.4* 29.0* 29.9*  MCV 83.7 82.1 82.2 82.8  PLT 90* 76* 84* 94*   Basic Metabolic Panel:  Recent Labs Lab 01/01/16 1724 01/02/16 0548 01/03/16 0602  NA 134* 140 137  K 5.0 4.7 4.9  CL 105 112* 109  CO2 21* 22 20*  GLUCOSE 222* 170* 219*  BUN 48* 35* 26*  CREATININE 2.38* 1.52* 1.15  CALCIUM 8.4* 8.4* 8.6*  MG  --   --  1.6*   GFR: Estimated Creatinine Clearance: 50.5 mL/min (by C-G formula based on SCr of 1.15 mg/dL). Liver Function Tests:  Recent Labs Lab 01/01/16 1724  AST 17  ALT 18  ALKPHOS 78  BILITOT 0.8  PROT 7.5  ALBUMIN 3.9   No results for input(s): LIPASE, AMYLASE in the last 168 hours. No results for input(s): AMMONIA in the last 168 hours. Coagulation Profile:  Recent Labs Lab 01/01/16 1724  INR 1.18  Cardiac Enzymes:  Recent Labs Lab 01/01/16 2129  TROPONINI <0.03   BNP (last 3 results) No results for input(s): PROBNP in the last 8760 hours. HbA1C: No results for input(s): HGBA1C in the last 72 hours. CBG:  Recent Labs Lab 01/02/16 1159 01/02/16 1644 01/03/16 0745 01/03/16 1140 01/03/16 1646  GLUCAP 170* 299* 183* 169* 229*   Lipid Profile: No results for input(s): CHOL, HDL, LDLCALC, TRIG, CHOLHDL, LDLDIRECT in the last 72 hours. Thyroid Function Tests:  Recent Labs  01/01/16 2132  TSH 2.521   Anemia Panel: No results for input(s): VITAMINB12, FOLATE, FERRITIN, TIBC, IRON, RETICCTPCT in the last 72 hours. Sepsis Labs:  Recent Labs Lab 01/01/16 1737 01/01/16 2132  LATICACIDVEN 1.98* 0.8    Recent Results (from the past 240 hour(s))  MRSA PCR Screening     Status: None   Collection Time: 01/02/16  6:31 AM  Result Value Ref Range Status   MRSA by PCR NEGATIVE NEGATIVE Final    Comment:        The GeneXpert MRSA Assay  (FDA approved for NASAL specimens only), is one component of a comprehensive MRSA colonization surveillance program. It is not intended to diagnose MRSA infection nor to guide or monitor treatment for MRSA infections.   Urine culture     Status: Abnormal   Collection Time: 01/02/16 12:35 PM  Result Value Ref Range Status   Specimen Description URINE, RANDOM  Final   Special Requests NONE  Final   Culture (A)  Final    <10,000 COLONIES/mL INSIGNIFICANT GROWTH Performed at [facility]    Report Status 01/03/2016 FINAL  Final  Respiratory Panel by PCR     Status: Abnormal   Collection Time: 01/02/16  1:11 PM  Result Value Ref Range Status   Adenovirus NOT DETECTED NOT DETECTED Final   Coronavirus 229E NOT DETECTED NOT DETECTED Final   Coronavirus HKU1 NOT DETECTED NOT DETECTED Final   Coronavirus NL63 NOT DETECTED NOT DETECTED Final   Coronavirus OC43 NOT DETECTED NOT DETECTED Final   Metapneumovirus DETECTED (A) NOT DETECTED Final   Rhinovirus / Enterovirus NOT DETECTED NOT DETECTED Final   Influenza A NOT DETECTED NOT DETECTED Final   Influenza B NOT DETECTED NOT DETECTED Final   Parainfluenza Virus 1 NOT DETECTED NOT DETECTED Final   Parainfluenza Virus 2 NOT DETECTED NOT DETECTED Final   Parainfluenza Virus 3 NOT DETECTED NOT DETECTED Final   Parainfluenza Virus 4 NOT DETECTED NOT DETECTED Final   Respiratory Syncytial Virus NOT DETECTED NOT DETECTED Final   Bordetella pertussis NOT DETECTED NOT DETECTED Final   Chlamydophila pneumoniae NOT DETECTED NOT DETECTED Final   Mycoplasma pneumoniae NOT DETECTED NOT DETECTED Final    Radiology Studies: Dg Chest Portable 1 View  Result Date: 01/01/2016 CLINICAL DATA:  70 year old male with issues with low blood pressure for about 3 days. Normally takes high blood pressure medication. Diabetic. Prior history smoking. Initial encounter. EXAM: PORTABLE CHEST 1 VIEW COMPARISON:  12/08/2015 CT and chest x-ray. FINDINGS:  Chronic elevated right hemidiaphragm. Central pulmonary vascular prominence stable without pulmonary edema, segmental infiltrate or pneumothorax. Calcified tortuous aorta.  Please see CT report recommendations. Heart size top-normal.  Coronary artery calcifications. T8 compression fracture treated with cement augmentation. IMPRESSION: No acute pulmonary abnormality. Calcified tortuous aorta. Please see recent CT report recommendations regarding follow-up. If there is any clinical suspicion of aortic abnormality contributing to low blood pressure then CT imaging would be necessary for further delineation. By plain film, findings appear  stable. Electronically Signed   By: Genia Del M.D.   On: 01/01/2016 18:37   Scheduled Meds: . acyclovir  200 mg Oral BID  . carvedilol  12.5 mg Oral BID WC  . doxycycline  100 mg Oral Q12H  . famotidine  20 mg Oral Daily  . fluticasone  2 spray Each Nare Daily  . gabapentin  300 mg Oral BID  . guaiFENesin  600 mg Oral BID  . insulin aspart  0-9 Units Subcutaneous TID WC  . ipratropium-albuterol  3 mL Nebulization Q6H  . levETIRAcetam  500 mg Oral BID  . levothyroxine  50 mcg Oral QAC breakfast  . methylPREDNISolone (SOLU-MEDROL) injection  60 mg Intravenous Q24H  . sertraline  50 mg Oral Daily  . simvastatin  10 mg Oral QHS  . sodium bicarbonate  650 mg Oral Daily  . sodium chloride HYPERTONIC  4 mL Nebulization Daily   Continuous Infusions:   LOS: 1 day    Kerney Elbe, DO [facility] Pager [phone]  If 7PM-7AM, please contact night-coverage [email or web address] Password [facility] 01/03/2016, 5:17 PM

## 2016-01-03 NOTE — Progress Notes (Signed)
24 hour urine was collected and sent to lab for testing.

## 2016-01-03 NOTE — Progress Notes (Signed)
Physical Therapy Treatment Patient Details Name: Douglas Mccoy MRN: [id number] DOB: 10-04-1945 Today's Date: 01/03/2016    History of Present Illness 70 yo male admitted with generalized weakness, fall, acute renal failure. Hx of COPD, DM, HTN, CVA, multiple myeloma, Sz. Pt is from a group home.    PT Comments    Progressing with mobility. Improved mobility and tolerance on today. Pt was able to walk without a device. He tolerated activity well. He continues to report not feeling well and he is still coughing quite a bit. Discussed d/c plan-pt feels he can return to his group home. PT recommendations have been updated. Recommend daily ambulation in hallway with nursing supervision so pt remains active during hospital stay.    Follow Up Recommendations  Supervision for mobility/OOB (at group home)     Equipment Recommendations  None recommended by PT    Recommendations for Other Services       Precautions / Restrictions Precautions Precautions: Fall Restrictions Weight Bearing Restrictions: No    Mobility  Bed Mobility Overal bed mobility: Modified Independent                Transfers     Transfers: Sit to/from Stand Sit to Stand: Supervision         General transfer comment: for safety  Ambulation/Gait Ambulation/Gait assistance: Min guard Ambulation Distance (Feet): 150 Feet Assistive device: None       General Gait Details: Unsteady at times but no overt LOB.     Stairs            Wheelchair Mobility    Modified Rankin (Stroke Patients Only)       Balance             Standing balance-Leahy Scale: Good                      Cognition Arousal/Alertness: Awake/alert Behavior During Therapy: WFL for tasks assessed/performed Overall Cognitive Status: Within Functional Limits for tasks assessed                      Exercises      General Comments        Pertinent Vitals/Pain Pain Assessment: No/denies pain     Home Living                      Prior Function            PT Goals (current goals can now be found in the care plan section) Progress towards PT goals: Progressing toward goals    Frequency    Min 3X/week      PT Plan Discharge plan needs to be updated    Co-evaluation             End of Session Equipment Utilized During Treatment: Gait belt Activity Tolerance: Patient tolerated treatment well Patient left: in bed;with call bell/phone within reach;with bed alarm set     Time: 1045-1053 PT Time Calculation (min) (ACUTE ONLY): 8 min  Charges:  $Gait Training: 8-22 mins                    G Codes:      Weston Anna, MPT Pager: [phone]

## 2016-01-03 NOTE — Progress Notes (Signed)
LCSWA met with patient at bedside. Patient reports his plan is to return to the [facility] Patient reports staff at the center will provide transportation back to facility when medically stable.  CSW is signing off at this time. Please reconsult if needed.   Kathrin Greathouse, Latanya Presser, MSW Clinical Social Worker 5E and Psychiatric Service Line [phone] 01/03/2016  1:51 PM

## 2016-01-04 ENCOUNTER — Inpatient Hospital Stay (HOSPITAL_COMMUNITY): Payer: Non-veteran care

## 2016-01-04 DIAGNOSIS — E871 Hypo-osmolality and hyponatremia: Secondary | ICD-10-CM

## 2016-01-04 LAB — CBC WITH DIFFERENTIAL/PLATELET
BASOS PCT: 0 %
Basophils Absolute: 0 10*3/uL (ref 0.0–0.1)
Eosinophils Absolute: 0 10*3/uL (ref 0.0–0.7)
Eosinophils Relative: 0 %
HEMATOCRIT: 32.8 % — AB (ref 39.0–52.0)
Hemoglobin: 11 g/dL — ABNORMAL LOW (ref 13.0–17.0)
LYMPHS PCT: 12 %
Lymphs Abs: 0.5 10*3/uL — ABNORMAL LOW (ref 0.7–4.0)
MCH: 27.4 pg (ref 26.0–34.0)
MCHC: 33.5 g/dL (ref 30.0–36.0)
MCV: 81.8 fL (ref 78.0–100.0)
MONO ABS: 0.5 10*3/uL (ref 0.1–1.0)
MONOS PCT: 13 %
NEUTROS ABS: 3.1 10*3/uL (ref 1.7–7.7)
Neutrophils Relative %: 75 %
Platelets: 114 10*3/uL — ABNORMAL LOW (ref 150–400)
RBC: 4.01 MIL/uL — ABNORMAL LOW (ref 4.22–5.81)
RDW: 16 % — AB (ref 11.5–15.5)
WBC: 4.2 10*3/uL (ref 4.0–10.5)

## 2016-01-04 LAB — PROTEIN ELECTROPHORESIS, SERUM
A/G Ratio: 1.1 (ref 0.7–1.7)
ALPHA-2-GLOBULIN: 0.8 g/dL (ref 0.4–1.0)
Albumin ELP: 3.6 g/dL (ref 2.9–4.4)
Alpha-1-Globulin: 0.2 g/dL (ref 0.0–0.4)
Beta Globulin: 0.7 g/dL (ref 0.7–1.3)
GAMMA GLOBULIN: 1.6 g/dL (ref 0.4–1.8)
GLOBULIN, TOTAL: 3.4 g/dL (ref 2.2–3.9)
M-SPIKE, %: 1 g/dL — AB
TOTAL PROTEIN ELP: 7 g/dL (ref 6.0–8.5)

## 2016-01-04 LAB — COMPREHENSIVE METABOLIC PANEL
ALT: 14 U/L — ABNORMAL LOW (ref 17–63)
ANION GAP: 9 (ref 5–15)
AST: 16 U/L (ref 15–41)
Albumin: 4 g/dL (ref 3.5–5.0)
Alkaline Phosphatase: 76 U/L (ref 38–126)
BILIRUBIN TOTAL: 1 mg/dL (ref 0.3–1.2)
BUN: 33 mg/dL — ABNORMAL HIGH (ref 6–20)
CO2: 21 mmol/L — ABNORMAL LOW (ref 22–32)
Calcium: 9.2 mg/dL (ref 8.9–10.3)
Chloride: 104 mmol/L (ref 101–111)
Creatinine, Ser: 1.24 mg/dL (ref 0.61–1.24)
GFR, EST NON AFRICAN AMERICAN: 56 mL/min — AB (ref >60)
Glucose, Bld: 200 mg/dL — ABNORMAL HIGH (ref 65–99)
POTASSIUM: 4.8 mmol/L (ref 3.5–5.1)
Sodium: 134 mmol/L — ABNORMAL LOW (ref 135–145)
Total Protein: 8.3 g/dL — ABNORMAL HIGH (ref 6.5–8.1)

## 2016-01-04 LAB — GLUCOSE, CAPILLARY
GLUCOSE-CAPILLARY: 188 mg/dL — AB (ref 65–99)
GLUCOSE-CAPILLARY: 168 mg/dL — AB (ref 65–99)
Glucose-Capillary: 222 mg/dL — ABNORMAL HIGH (ref 65–99)
Glucose-Capillary: 361 mg/dL — ABNORMAL HIGH (ref 65–99)

## 2016-01-04 LAB — PHOSPHORUS: Phosphorus: 3.5 mg/dL (ref 2.5–4.6)

## 2016-01-04 LAB — MAGNESIUM: MAGNESIUM: 1.7 mg/dL (ref 1.7–2.4)

## 2016-01-04 MED ORDER — LISINOPRIL 5 MG PO TABS
2.5000 mg | ORAL_TABLET | Freq: Every day | ORAL | Status: DC
  Administered 2016-01-04 – 2016-01-06 (×3): 2.5 mg via ORAL
  Filled 2016-01-04 (×3): qty 1

## 2016-01-04 MED ORDER — IPRATROPIUM-ALBUTEROL 0.5-2.5 (3) MG/3ML IN SOLN
3.0000 mL | Freq: Two times a day (BID) | RESPIRATORY_TRACT | Status: DC
  Administered 2016-01-05 – 2016-01-06 (×3): 3 mL via RESPIRATORY_TRACT
  Filled 2016-01-04 (×3): qty 3

## 2016-01-04 MED ORDER — INSULIN ASPART 100 UNIT/ML ~~LOC~~ SOLN
8.0000 [IU] | Freq: Once | SUBCUTANEOUS | Status: AC
  Administered 2016-01-04: 8 [IU] via SUBCUTANEOUS

## 2016-01-04 NOTE — Progress Notes (Signed)
Physical Therapy Treatment Patient Details Name: Douglas Mccoy MRN: [id number] DOB: 05/06/1945 Today's Date: 01/04/2016    History of Present Illness 70 yo male admitted with generalized weakness, fall, acute renal failure. Hx of COPD, DM, HTN, CVA, multiple myeloma, Sz. Pt is from a group home.    PT Comments    LOB x 3 during ambulation on today. Min assist to prevent fall(s). Pt c/o some lightheadedness as well. Based on today's performance, will recommend HHPT at group home if this is an option. Encouraged pt to spend more time OOB and to start ambulating with nursing as able.    Follow Up Recommendations  Home health PT;Supervision for mobility/OOB     Equipment Recommendations   (may need RW if pt remains unsteady and if pt will agree to use it)    Recommendations for Other Services       Precautions / Restrictions Precautions Precautions: Fall Restrictions Weight Bearing Restrictions: No    Mobility  Bed Mobility Overal bed mobility: Modified Independent                Transfers Overall transfer level: Needs assistance Equipment used: None Transfers: Sit to/from Stand Sit to Stand: Min guard         General transfer comment: close guard for safety. Unsteady  Ambulation/Gait Ambulation/Gait assistance: Min assist Ambulation Distance (Feet): 200 Feet Assistive device: None Gait Pattern/deviations: Step-through pattern;Decreased stride length     General Gait Details: LOB x3 during ambulation on today. Pt also c/o some lightheadedness. Min assist to prevent fall(s)   Stairs            Wheelchair Mobility    Modified Rankin (Stroke Patients Only)       Balance                                    Cognition Arousal/Alertness: Awake/alert Behavior During Therapy: WFL for tasks assessed/performed Overall Cognitive Status: Within Functional Limits for tasks assessed                      Exercises General  Exercises - Lower Extremity Hip ABduction/ADduction: AROM;Both;10 reps;Standing Hip Flexion/Marching: AROM;Both;10 reps;Standing Heel Raises: AROM;Both;10 reps;Standing Mini-Sqauts: AROM;10 reps;Standing    General Comments        Pertinent Vitals/Pain Pain Assessment: Faces Faces Pain Scale: Hurts little more Pain Location: back Pain Descriptors / Indicators: Sore;Aching Pain Intervention(s): Monitored during session    Home Living                      Prior Function            PT Goals (current goals can now be found in the care plan section) Progress towards PT goals: Progressing toward goals    Frequency    Min 3X/week      PT Plan Discharge plan needs to be updated    Co-evaluation             End of Session Equipment Utilized During Treatment: Gait belt Activity Tolerance: Patient tolerated treatment well Patient left: in bed;with call bell/phone within reach;with bed alarm set     Time: 2778-2423 PT Time Calculation (min) (ACUTE ONLY): 15 min  Charges:  $Gait Training: 8-22 mins                    G Codes:  Weston Anna, MPT Pager: [phone]

## 2016-01-04 NOTE — Progress Notes (Signed)
PROGRESS NOTE    Douglas Mccoy  TKW:[id number] DOB: 1945-05-17 DOA: 01/01/2016 PCP: [facility]  Brief Narrative:  Douglas Mccoy a 70 y.o.malewith hypertension, COPD, diabetes mellitus, multiple myeloma in remission presents to the ER because of weakness. Patient states over the last 2 days he has been feeling weak and early in the morning while walking to the bathroom patient felt weak and fell. Denies hitting his head or losing consciousness. He states he had been lying on the floor at least for an hour or 2. Later he [name] and went back to the bed. He was shopping in the evening at [facility] when he felt very weak and went back home. After reaching home since his weakness persisted he called EMS. In the ER patient was found to be hypotensive and was given fluid bolus. In addition labs show worsening renal function and anemia with mild worsening from previous. Patient otherwise denies any chest pain shortness of breath. Has chronic diarrhea.Also admits to Cough which has worsened. Admitted for weakness and Hypotension and now being treated for Cough/Wheezing. States cough and wheeze has improved today but still feels weak.   Assessment & Plan:   Principal Problem:   Generalized weakness Active Problems:   Acute renal failure (ARF) (HCC)   Type II diabetes mellitus with peripheral circulatory disorder (HCC)   Hypertension   Multiple myeloma (HCC)   Hx of CABG   Dehydration   Weakness generalized  Acute Kidney Injury -Renal US in October no Obstructive nephropathy,  -Urinalysis Negative  -BUN/Cr went from 26/1.15 -> 33/1.24 -Cr improving on IVF; Restarted Home Lisinopril of 2.5 mg po daily -Avoid other Nephrotoxics -Repeat CMP in AM  Weakness, Fall, hypotension -S/p Fluid Boluses -Am cortisol level wnl (tested prior to starting steroid) -Home bp meds Clonidine; he is continued on Coreg; Restarted Lisinopril -Per Patient he was taken of Amlodipine by  PCP -PT updated Recc's and recommend going back to Group Home with [facility]  Cough/running nose and wheezing/COPD exacerbation:  -he reports cough started three days ago, he is uptodate with flu vaccine, he is a past smoker,  -Initial CXR no infiltrate, no fever, no leukocytosis Respiratory panel Positive for Metapneumovirus -C/w solumedrol/flonase/mucinex/nebs/doxycycline ( to cover possible bronchitis) -Changed Nebs to DuoNeb q6h and added Hypertonic Saline Nebs -Repeat CXR this AM showed Bibasilar atelectasis. Elevation right hemidiaphragm. No edema or consolidation. Aortic atherosclerosis. Status post coronary artery bypass grafting. -Continue to Monitor closely  Thrombocytopenia/Anemia:  -hgb seems to close to baseline, Plt improved to 114 - from acute URI? Continue monitor, on scd  - Spep /upep due to h/o multiple myeloma - Had M Spike - Repeat CBC in AM - Outpatient follow up with Dr. Alvy Bimler in Hematology/Oncology  Multiple myelomas/p bone marrow transplant at [facility].  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 Lindi Adie and will need to follow up with Oncology as an outpatient with Dr. Alvy Bimler.  -? [facility]  Chronic Diarrhea -C/w Loperamide 2 mg po TIDprn Diarrhea or Loose Stools.   CAD sp CABG with perioperative CVA in 2004,  -Currently stable. -No chest pain, no acute EKG changes,  -continue aspirin, coreg, statin  Diabetes Mellitus Type 2 (new Diagnosis) -Hemoglobin A1c 7.   -C/w Sensitive Novolog SSI Ac. -CBG's ranging from 169-229 -May Increase SSI dose as patient is on IV  Steroids  History CVA x2 with left finger numbness, poor memories  -Currently stable. -Continue Aspirin and Simvastatin 10 mg po qHS  History of seizure disorder  - Continue  Keppra 500 mg po BID,  - Last seizure one and half month ago - Impaired memory at baseline, know the year/month, know he is in the hospital, not able to name the hospital.  Hypothyroidism: -TSH was 2.521 -C/w Synthroid 50 mcg Daily  DVT prophylaxis: SCD's Code Status: FULL Family Communication: No Family Present at Bedside Disposition Plan: [place] likely tomorrow  Consultants:   Dr. Lindi Adie by phone Consultation. Discussed about Oncology Follow Up as an outpatient.   Procedures: 24 Hour Urine for SPEP/UPEP   Antimicrobials: Doxycycline from 01/02/2016 -->  Subjective: Seen and examined and stated he was still weak but cough was improved and that he was coughing less. No N/V/ Abdominal Pain and tolerating diet well. No other concerns or complaints at this time. PT to work with patient later on again today.   Objective: Vitals:   01/03/16 2219 01/04/16 0556 01/04/16 0819 01/04/16 1347  BP: (!) 175/85 (!) 171/85  112/62  Pulse: 72 68  74  Resp: 20 19  18   Temp: 98.8 F (37.1 C) 98.2 F (36.8 C)  97.8 F (36.6 C)  TempSrc: Oral Oral  Oral  SpO2: 96% 96% 95% 98%  Weight:  67.8 kg (149 lb 7.6 oz)    Height:        Intake/Output Summary (Last 24 hours) at 01/04/16 1647 Last data filed at 01/04/16 1347  Gross per 24 hour  Intake              480 ml  Output             1400 ml  Net             -920 ml   Filed Weights   01/01/16 1755 01/03/16 0900 01/04/16 0556  Weight: 65.8 kg (145 lb) 68.8 kg (151 lb 10.8 oz) 67.8 kg (149 lb 7.6 oz)    Examination: Physical Exam:  Constitutional: WN/WD, NAD and appears calm and comfortable Eyes: Lids and conjunctivae normal, sclerae anicteric  ENMT: External Ears, Nose appear normal. Grossly normal hearing. Neck: Appears normal, supple, no cervical masses, normal ROM, no appreciable thyromegaly Respiratory: Diminished but CTAB. No Rhonchi and wheezing has improved. Patient not tachypenic or using accessory muscles to  breathe. Cardiovascular: RRR, no murmurs / rubs / gallops. S1 and S2 auscultated. No extremity edema.  Abdomen: Soft, non-tender, non-distended. No masses palpated. No appreciable hepatosplenomegaly. Bowel sounds positive x4.  GU: Deferred. Musculoskeletal: No clubbing / cyanosis of digits/nails. No joint deformity upper and lower extremities. Mild tenderness on Right from neuropathy. Skin: No rashes, lesions, ulcers. No induration; Warm and dry.  Neurologic: CN 2-12 grossly intact with no focal deficits. Sensation intact in all 4 Extremities. Romberg sign cerebellar reflexes not assessed.  Psychiatric: Normal judgment and insight. Alert and oriented x 3. Normal mood and appropriate affect.   Data Reviewed: I have personally reviewed following labs and imaging studies  CBC:  Recent Labs Lab 01/01/16 1724 01/01/16 2129 01/02/16 0548 01/03/16 0602 01/04/16 0535  WBC 11.6* 8.2 7.3 5.1 4.2  NEUTROABS  --   --   --  4.0 3.1  HGB 9.8* 9.5* 9.7* 10.0* 11.0*  HCT 29.3* 28.4* 29.0* 29.9* 32.8*  MCV 83.7 82.1 82.2 82.8 81.8  PLT 90* 76* 84* 94* 388*   Basic Metabolic Panel:  Recent Labs Lab 01/01/16 1724 01/02/16 0548 01/03/16 0602 01/04/16 0535  NA 134* 140 137 134*  K 5.0 4.7 4.9 4.8  CL 105 112* 109 104  CO2 21* 22 20* 21*  GLUCOSE 222* 170* 219* 200*  BUN 48* 35* 26* 33*  CREATININE 2.38* 1.52* 1.15 1.24  CALCIUM 8.4* 8.4* 8.6* 9.2  MG  --   --  1.6* 1.7  PHOS  --   --   --  3.5   GFR: Estimated Creatinine Clearance: 46.8 mL/min (by C-G formula based on SCr of 1.24 mg/dL). Liver Function Tests:  Recent Labs Lab 01/01/16 1724 01/04/16 0535  AST 17 16  ALT 18 14*  ALKPHOS 78 76  BILITOT 0.8 1.0  PROT 7.5 8.3*  ALBUMIN 3.9 4.0   No results for input(s): LIPASE, AMYLASE in the last 168 hours. No results for input(s): AMMONIA in the last 168 hours. Coagulation Profile:  Recent Labs Lab 01/01/16 1724  INR 1.18   Cardiac Enzymes:  Recent Labs Lab  01/01/16 2129  TROPONINI <0.03   BNP (last 3 results) No results for input(s): PROBNP in the last 8760 hours. HbA1C: No results for input(s): HGBA1C in the last 72 hours. CBG:  Recent Labs Lab 01/03/16 1140 01/03/16 1646 01/03/16 2215 01/04/16 0735 01/04/16 1134  GLUCAP 169* 229* 324* 188* 168*   Lipid Profile: No results for input(s): CHOL, HDL, LDLCALC, TRIG, CHOLHDL, LDLDIRECT in the last 72 hours. Thyroid Function Tests:  Recent Labs  01/01/16 2132  TSH 2.521   Anemia Panel: No results for input(s): VITAMINB12, FOLATE, FERRITIN, TIBC, IRON, RETICCTPCT in the last 72 hours. Sepsis Labs:  Recent Labs Lab 01/01/16 1737 01/01/16 2132  LATICACIDVEN 1.98* 0.8    Recent Results (from the past 240 hour(s))  MRSA PCR Screening     Status: None   Collection Time: 01/02/16  6:31 AM  Result Value Ref Range Status   MRSA by PCR NEGATIVE NEGATIVE Final    Comment:        The GeneXpert MRSA Assay (FDA approved for NASAL specimens only), is one component of a comprehensive MRSA colonization surveillance program. It is not intended to diagnose MRSA infection nor to guide or monitor treatment for MRSA infections.   Urine culture     Status: Abnormal   Collection Time: 01/02/16 12:35 PM  Result Value Ref Range Status   Specimen Description URINE, RANDOM  Final   Special Requests NONE  Final   Culture (A)  Final    <10,000 COLONIES/mL INSIGNIFICANT GROWTH Performed at [facility]    Report Status 01/03/2016 FINAL  Final  Respiratory Panel by PCR     Status: Abnormal   Collection Time: 01/02/16  1:11 PM  Result Value Ref Range Status   Adenovirus NOT DETECTED NOT DETECTED Final   Coronavirus 229E NOT DETECTED NOT DETECTED Final   Coronavirus HKU1 NOT DETECTED NOT DETECTED Final   Coronavirus NL63 NOT DETECTED NOT DETECTED Final   Coronavirus OC43 NOT DETECTED NOT DETECTED Final   Metapneumovirus DETECTED (A) NOT DETECTED Final   Rhinovirus /  Enterovirus NOT DETECTED NOT DETECTED Final   Influenza A NOT DETECTED NOT DETECTED Final   Influenza B NOT DETECTED NOT DETECTED Final   Parainfluenza Virus 1 NOT DETECTED NOT DETECTED Final   Parainfluenza Virus 2 NOT DETECTED NOT DETECTED Final   Parainfluenza Virus 3 NOT DETECTED NOT DETECTED Final   Parainfluenza Virus 4 NOT DETECTED NOT DETECTED Final   Respiratory Syncytial Virus  NOT DETECTED NOT DETECTED Final   Bordetella pertussis NOT DETECTED NOT DETECTED Final   Chlamydophila pneumoniae NOT DETECTED NOT DETECTED Final   Mycoplasma pneumoniae NOT DETECTED NOT DETECTED Final    Radiology Studies: Dg Chest 2 View  Result Date: 01/04/2016 CLINICAL DATA:  Shortness of breath and cough EXAM: CHEST  2 VIEW COMPARISON:  January 01, 2016 FINDINGS: There is stable elevation of the left hemidiaphragm. There is mild bibasilar atelectasis. There is no edema or consolidation. Heart size and pulmonary vascularity are within normal limits. No adenopathy. Patient is status post coronary artery bypass grafting. There is atherosclerotic calcification in the aorta. Patient has undergone prior kyphoplasty at T8. IMPRESSION: Bibasilar atelectasis. Elevation right hemidiaphragm. No edema or consolidation. Aortic atherosclerosis. Status post coronary artery bypass grafting. Electronically Signed   By: Lowella Grip III M.D.   On: 01/04/2016 10:04   Scheduled Meds: . acyclovir  200 mg Oral BID  . carvedilol  12.5 mg Oral BID WC  . doxycycline  100 mg Oral Q12H  . famotidine  20 mg Oral Daily  . fluticasone  2 spray Each Nare Daily  . gabapentin  300 mg Oral BID  . guaiFENesin  600 mg Oral BID  . insulin aspart  0-9 Units Subcutaneous TID WC  . ipratropium-albuterol  3 mL Nebulization TID  . levETIRAcetam  500 mg Oral BID  . levothyroxine  50 mcg Oral QAC breakfast  . lisinopril  2.5 mg Oral Daily  . methylPREDNISolone (SOLU-MEDROL) injection  60 mg Intravenous Q24H  . sertraline  50 mg Oral  Daily  . simvastatin  10 mg Oral QHS  . sodium bicarbonate  650 mg Oral Daily  . sodium chloride HYPERTONIC  4 mL Nebulization Daily   Continuous Infusions:   LOS: 2 days    Kerney Elbe, DO [facility] Pager [phone]  If 7PM-7AM, please contact night-coverage [email or web address] Password [facility] 01/04/2016, 4:47 PM

## 2016-01-05 ENCOUNTER — Encounter: Payer: Self-pay | Admitting: *Deleted

## 2016-01-05 DIAGNOSIS — C9000 Multiple myeloma not having achieved remission: Secondary | ICD-10-CM

## 2016-01-05 LAB — GLUCOSE, CAPILLARY
GLUCOSE-CAPILLARY: 196 mg/dL — AB (ref 65–99)
GLUCOSE-CAPILLARY: 169 mg/dL — AB (ref 65–99)
Glucose-Capillary: 192 mg/dL — ABNORMAL HIGH (ref 65–99)
Glucose-Capillary: 355 mg/dL — ABNORMAL HIGH (ref 65–99)

## 2016-01-05 LAB — COMPREHENSIVE METABOLIC PANEL
ALBUMIN: 4 g/dL (ref 3.5–5.0)
ALK PHOS: 68 U/L (ref 38–126)
ALT: 17 U/L (ref 17–63)
ANION GAP: 8 (ref 5–15)
AST: 18 U/L (ref 15–41)
BUN: 54 mg/dL — ABNORMAL HIGH (ref 6–20)
CALCIUM: 9.8 mg/dL (ref 8.9–10.3)
CO2: 22 mmol/L (ref 22–32)
Chloride: 103 mmol/L (ref 101–111)
Creatinine, Ser: 1.36 mg/dL — ABNORMAL HIGH (ref 0.61–1.24)
GFR calc Af Amer: 58 mL/min — ABNORMAL LOW (ref >60)
GFR, EST NON AFRICAN AMERICAN: 50 mL/min — AB (ref >60)
GLUCOSE: 212 mg/dL — AB (ref 65–99)
POTASSIUM: 4.7 mmol/L (ref 3.5–5.1)
Sodium: 133 mmol/L — ABNORMAL LOW (ref 135–145)
TOTAL PROTEIN: 8.1 g/dL (ref 6.5–8.1)
Total Bilirubin: 0.7 mg/dL (ref 0.3–1.2)

## 2016-01-05 LAB — PHOSPHORUS: Phosphorus: 4.3 mg/dL (ref 2.5–4.6)

## 2016-01-05 LAB — CBC WITH DIFFERENTIAL/PLATELET
BASOS PCT: 0 %
Basophils Absolute: 0 10*3/uL (ref 0.0–0.1)
Eosinophils Absolute: 0 10*3/uL (ref 0.0–0.7)
Eosinophils Relative: 0 %
HCT: 32.6 % — ABNORMAL LOW (ref 39.0–52.0)
HEMOGLOBIN: 11.3 g/dL — AB (ref 13.0–17.0)
LYMPHS ABS: 0.6 10*3/uL — AB (ref 0.7–4.0)
LYMPHS PCT: 15 %
MCH: 27.5 pg (ref 26.0–34.0)
MCHC: 34.7 g/dL (ref 30.0–36.0)
MCV: 79.3 fL (ref 78.0–100.0)
MONO ABS: 0.5 10*3/uL (ref 0.1–1.0)
MONOS PCT: 11 %
NEUTROS PCT: 74 %
Neutro Abs: 3.1 10*3/uL (ref 1.7–7.7)
Platelets: 134 10*3/uL — ABNORMAL LOW (ref 150–400)
RBC: 4.11 MIL/uL — ABNORMAL LOW (ref 4.22–5.81)
RDW: 15.9 % — AB (ref 11.5–15.5)
WBC: 4.2 10*3/uL (ref 4.0–10.5)

## 2016-01-05 LAB — MAGNESIUM: MAGNESIUM: 1.9 mg/dL (ref 1.7–2.4)

## 2016-01-05 MED ORDER — SODIUM CHLORIDE 0.9 % IV SOLN
INTRAVENOUS | Status: DC
  Administered 2016-01-05 (×2): via INTRAVENOUS

## 2016-01-05 MED ORDER — GABAPENTIN 400 MG PO CAPS
400.0000 mg | ORAL_CAPSULE | Freq: Two times a day (BID) | ORAL | Status: DC
  Administered 2016-01-05 – 2016-01-06 (×2): 400 mg via ORAL
  Filled 2016-01-05 (×2): qty 1

## 2016-01-05 MED ORDER — INSULIN ASPART 100 UNIT/ML ~~LOC~~ SOLN
7.0000 [IU] | Freq: Once | SUBCUTANEOUS | Status: AC
  Administered 2016-01-05: 7 [IU] via SUBCUTANEOUS

## 2016-01-05 NOTE — Progress Notes (Signed)
Inpatient Diabetes Program Recommendations  AACE/ADA: New Consensus Statement on Inpatient Glycemic Control (2015)  Target Ranges:  Prepandial:   less than 140 mg/dL      Peak postprandial:   less than 180 mg/dL (1-2 hours)      Critically ill patients:  140 - 180 mg/dL   Results for Douglas Mccoy, Douglas Mccoy (MRN [place]:[id number]) as of 01/05/2016 11:51  Ref. Range 01/04/2016 07:35 01/04/2016 11:34 01/04/2016 16:50 01/04/2016 21:12  Glucose-Capillary Latest Ref Range: 65 - 99 mg/dL 188 (H) 168 (H) 222 (H) 361 (H)   Results for Douglas Mccoy, Douglas Mccoy (MRN [place]:[id number]) as of 01/05/2016 11:51  Ref. Range 01/05/2016 07:46 01/05/2016 11:49  Glucose-Capillary Latest Ref Range: 65 - 99 mg/dL 196 (H) 169 (H)    Home DM Meds: Novolog 0-10 units TID per SSI  Current Insulin Orders: Novolog Sensitive Correction Scale/ SSI (0-9 units) TID AC     MD- Note patient receiving Solumedrol 60 mg daily.  Eating 100% of meals and having elevated fasting and postprandial glucose levels.  Please consider the following in-hospital insulin adjustments while patient continues on steroids:  1. Start Lantus 10 units daily (0.15 units/kg dosing based on weight of 64 kg)  2. Start Novolog Meal Coverage: Novolog 3 units TID with meals (hold if pt eats <50% of meal)      --Will follow patient during hospitalization--  Wyn Quaker RN, MSN, CDE Diabetes Coordinator Inpatient Glycemic Control Team Team Pager: [phone] (8a-5p)

## 2016-01-05 NOTE — Progress Notes (Signed)
Received In Basket message from Dr. Lindi Adie that patient needed an OP Medical Oncology consult and to schedule with Dr. Alvy Bimler or Dr. Irene Limbo.  Dr. Alvy Bimler is unable to see patient.  Dr. Irene Limbo returns 01/16/16.  I consulted with Dr. Marin Olp, Medical Oncologist on-call and confirmed that it is OK to schedule patient with Dr. Irene Limbo the first week in January.  Scheduling In Basket message sent to Scheduling.

## 2016-01-05 NOTE — Progress Notes (Signed)
PROGRESS NOTE    Douglas Mccoy  ZDG:[id number] DOB: 1945-08-10 DOA: 01/01/2016 PCP: [facility]  Brief Narrative:  Douglas Mccoy a 70 y.o.malewith hypertension, COPD, diabetes mellitus, multiple myeloma in remission presents to the ER because of weakness. Patient states over the last 2 days he has been feeling weak and early in the morning while walking to the bathroom patient felt weak and fell. Denies hitting his head or losing consciousness. He states he had been lying on the floor at least for an hour or 2. Later he [place] and went back to the bed. He was shopping in the evening at [facility] when he felt very weak and went back home. After reaching home since his weakness persisted he called EMS. In the ER patient was found to be hypotensive and was given fluid bolus. In addition labs show worsening renal function and anemia with mild worsening from previous. Patient otherwise denies any chest pain shortness of breath. Has chronic diarrhea.Also admits to Cough which has worsened. Admitted for weakness and Hypotension and now being treated for Cough/Wheezing.   Assessment & Plan:   Principal Problem:   Generalized weakness Active Problems:   Acute renal failure (ARF) (HCC)   Type II diabetes mellitus with peripheral circulatory disorder (HCC)   Hypertension   Multiple myeloma (HCC)   Hx of CABG   Dehydration   Weakness generalized  Cough/running nose and wheezing/COPD exacerbation:  -he reports cough started three days ago, he is uptodate with flu vaccine, he is a past smoker,  -Initial CXR no infiltrate, no fever, no leukocytosis Respiratory panel Positive for Metapneumovirus -C/w solumedrol/flonase/mucinex/nebs/doxycycline ( to cover possible bronchitis) -Changed Nebs to DuoNeb q6h and added Hypertonic Saline Nebs -Repeat CXR yesterday showed Bibasilar atelectasis. Elevation right hemidiaphragm. No edema or consolidation. Aortic atherosclerosis. Status post  coronary artery bypass grafting. -Continue to Monitor closely and likely D/C in AM  Acute Kidney Injury -Renal US in October no Obstructive nephropathy,  -Urinalysis Negative  -BUN/Cr went from 26/1.15 -> 33/1.24 -> 54/1.36 -Restarted  IVF; Restarted Home Lisinopril of 2.5 mg po daily -Avoid other Nephrotoxics -Repeat CMP in AM  Generalized Weakness and Deconditioning with Hypotension -S/p Fluid Boluses and Hypotension resolved -Am cortisol level wnl (tested prior to starting steroid) -Home bp meds Clonidine; he is continued on Coreg; Restarted Lisinopril -Per Patient he was taken of Amlodipine by PCP -PT updated Recc's and recommend going back to Group Home with [place]:  -hgb seems to close to baseline, Plt improved to 134 - from acute URI? Continue monitor, on scd  - Spep /upep due to h/o multiple myeloma - Had M Spike - Repeat CBC in AM - Outpatient follow up with Dr. Alvy Bimler in Hematology/Oncology or [facility] in [place]  Multiple myelomas/p bone marrow transplant at [facility] in [place].  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 Lindi Adie and will need to follow up with Oncology as an outpatient with Dr. Alvy Bimler.  -Followed with [facility] and will need to follow up  Chronic Diarrhea -C/w Loperamide 2 mg po TIDprn Diarrhea or Loose Stools.   CAD sp CABG with perioperative CVA in 2004,  -Currently stable. -No chest pain, no acute EKG changes,  -continue aspirin, coreg, statin  Diabetes Mellitus Type 2 (new Diagnosis) -Hemoglobin A1c 7.   -C/w Sensitive Novolog SSI Ac. -CBG's  ranging from 169-196 -Diabetes Education Coordinator recommeds adding Lantus 10 units daily -May Increase SSI dose as patient is on IV Steroids  History CVA x2 with left  finger numbness, poor memories  -Currently stable. -Continue Aspirin and Simvastatin 10 mg po qHS  History of seizure disorder  - Continue Keppra 500 mg po BID,  - Last seizure one and half month ago - Impaired memory at baseline, know the year/month, know he is in the hospital, not able to name the hospital.  Hypothyroidism: -TSH was 2.521 -C/w Synthroid 50 mcg Daily  DVT prophylaxis: SCD's Code Status: FULL Family Communication: No Family Present at Bedside Disposition Plan: [place] likely tomorrow  Consultants:   Dr. Lindi Adie by phone Consultation. Discussed about Oncology Follow Up as an outpatient.   Procedures: 24 Hour Urine for SPEP/UPEP   Antimicrobials: Doxycycline from 01/02/2016 -->  Subjective: Seen and examined and stated his weakness was better but his cough was not. No Nausea or vomiting. No Cp. No other concerns or complaints at this time.   Objective: Vitals:   01/05/16 0543 01/05/16 0742 01/05/16 0745 01/05/16 1507  BP: (!) 157/69   116/69  Pulse: 69   65  Resp: 18   18  Temp: 98.3 F (36.8 C)   98.1 F (36.7 C)  TempSrc: Oral   Oral  SpO2: 94% 99% 100% 96%  Weight:      Height:        Intake/Output Summary (Last 24 hours) at 01/05/16 2007 Last data filed at 01/05/16 1500  Gross per 24 hour  Intake              470 ml  Output              500 ml  Net              -30 ml   Filed Weights   01/03/16 0900 01/04/16 0556 01/05/16 0500  Weight: 68.8 kg (151 lb 10.8 oz) 67.8 kg (149 lb 7.6 oz) 64.2 kg (141 lb 8.6 oz)   Examination: Physical Exam:  Constitutional: WN/WD, NAD and appears calm and comfortable Eyes: Lids and conjunctivae normal, sclerae anicteric  ENMT: External Ears, Nose appear normal. Grossly normal hearing. Neck: Appears normal, supple, no cervical masses, normal ROM, no appreciable thyromegaly Respiratory: Diminished but CTAB. No Rhonchi and wheezing has improved. Patient not tachypenic or using accessory muscles to  breathe. Non-productive cough.  Cardiovascular: RRR, no murmurs / rubs / gallops. S1 and S2 auscultated. No extremity edema.  Abdomen: Soft, non-tender, non-distended. No masses palpated. No appreciable hepatosplenomegaly. Bowel sounds positive x4.  GU: Deferred. Musculoskeletal: No clubbing / cyanosis of digits/nails. No joint deformity upper and lower extremities. Mild tenderness on Right from neuropathy. Skin: No rashes, lesions, ulcers. No induration; Warm and dry.  Neurologic: CN 2-12 grossly intact with no focal deficits. Sensation intact in all 4 Extremities. Romberg sign cerebellar reflexes not assessed.  Psychiatric: Normal judgment and insight. Alert and oriented x 3. Normal mood and appropriate affect.   Data Reviewed: I have personally reviewed following labs and imaging studies  CBC:  Recent Labs Lab 01/01/16 2129 01/02/16 0548 01/03/16 0602 01/04/16 0535 01/05/16 0532  WBC 8.2 7.3 5.1 4.2 4.2  NEUTROABS  --   --  4.0 3.1 3.1  HGB 9.5* 9.7* 10.0* 11.0* 11.3*  HCT 28.4* 29.0* 29.9* 32.8* 32.6*  MCV 82.1 82.2 82.8 81.8 79.3  PLT 76* 84* 94* 114* 563*   Basic Metabolic Panel:  Recent Labs Lab 01/01/16 1724 01/02/16 0548 01/03/16 0602 01/04/16 0535 01/05/16 0532  NA 134* 140 137 134* 133*  K 5.0 4.7 4.9 4.8 4.7  CL 105 112* 109 104 103  CO2 21* 22 20* 21* 22  GLUCOSE 222* 170* 219* 200* 212*  BUN 48* 35* 26* 33* 54*  CREATININE 2.38* 1.52* 1.15 1.24 1.36*  CALCIUM 8.4* 8.4* 8.6* 9.2 9.8  MG  --   --  1.6* 1.7 1.9  PHOS  --   --   --  3.5 4.3   GFR: Estimated Creatinine Clearance: 42.7 mL/min (by C-G formula based on SCr of 1.36 mg/dL (H)). Liver Function Tests:  Recent Labs Lab 01/01/16 1724 01/04/16 0535 01/05/16 0532  AST 17 16 18   ALT 18 14* 17  ALKPHOS 78 76 68  BILITOT 0.8 1.0 0.7  PROT 7.5 8.3* 8.1  ALBUMIN 3.9 4.0 4.0   No results for input(s): LIPASE, AMYLASE in the last 168 hours. No results for input(s): AMMONIA in the last 168  hours. Coagulation Profile:  Recent Labs Lab 01/01/16 1724  INR 1.18   Cardiac Enzymes:  Recent Labs Lab 01/01/16 2129  TROPONINI <0.03   BNP (last 3 results) No results for input(s): PROBNP in the last 8760 hours. HbA1C: No results for input(s): HGBA1C in the last 72 hours. CBG:  Recent Labs Lab 01/04/16 1650 01/04/16 2112 01/05/16 0746 01/05/16 1149 01/05/16 1618  GLUCAP 222* 361* 196* 169* 192*   Lipid Profile: No results for input(s): CHOL, HDL, LDLCALC, TRIG, CHOLHDL, LDLDIRECT in the last 72 hours. Thyroid Function Tests: No results for input(s): TSH, T4TOTAL, FREET4, T3FREE, THYROIDAB in the last 72 hours. Anemia Panel: No results for input(s): VITAMINB12, FOLATE, FERRITIN, TIBC, IRON, RETICCTPCT in the last 72 hours. Sepsis Labs:  Recent Labs Lab 01/01/16 1737 01/01/16 2132  LATICACIDVEN 1.98* 0.8    Recent Results (from the past 240 hour(s))  MRSA PCR Screening     Status: None   Collection Time: 01/02/16  6:31 AM  Result Value Ref Range Status   MRSA by PCR NEGATIVE NEGATIVE Final    Comment:        The GeneXpert MRSA Assay (FDA approved for NASAL specimens only), is one component of a comprehensive MRSA colonization surveillance program. It is not intended to diagnose MRSA infection nor to guide or monitor treatment for MRSA infections.   Urine culture     Status: Abnormal   Collection Time: 01/02/16 12:35 PM  Result Value Ref Range Status   Specimen Description URINE, RANDOM  Final   Special Requests NONE  Final   Culture (A)  Final    <10,000 COLONIES/mL INSIGNIFICANT GROWTH Performed at [facility]    Report Status 01/03/2016 FINAL  Final  Respiratory Panel by PCR     Status: Abnormal   Collection Time: 01/02/16  1:11 PM  Result Value Ref Range Status   Adenovirus NOT DETECTED NOT DETECTED Final   Coronavirus 229E NOT DETECTED NOT DETECTED Final   Coronavirus HKU1 NOT DETECTED NOT DETECTED Final   Coronavirus NL63 NOT  DETECTED NOT DETECTED Final   Coronavirus OC43 NOT DETECTED NOT DETECTED Final   Metapneumovirus DETECTED (A) NOT DETECTED Final   Rhinovirus / Enterovirus NOT DETECTED NOT DETECTED Final   Influenza A NOT DETECTED NOT DETECTED Final   Influenza B NOT DETECTED NOT DETECTED Final   Parainfluenza Virus 1 NOT DETECTED NOT DETECTED Final   Parainfluenza Virus 2 NOT DETECTED NOT DETECTED Final  Parainfluenza Virus 3 NOT DETECTED NOT DETECTED Final   Parainfluenza Virus 4 NOT DETECTED NOT DETECTED Final   Respiratory Syncytial Virus NOT DETECTED NOT DETECTED Final   Bordetella pertussis NOT DETECTED NOT DETECTED Final   Chlamydophila pneumoniae NOT DETECTED NOT DETECTED Final   Mycoplasma pneumoniae NOT DETECTED NOT DETECTED Final    Radiology Studies: Dg Chest 2 View  Result Date: 01/04/2016 CLINICAL DATA:  Shortness of breath and cough EXAM: CHEST  2 VIEW COMPARISON:  January 01, 2016 FINDINGS: There is stable elevation of the left hemidiaphragm. There is mild bibasilar atelectasis. There is no edema or consolidation. Heart size and pulmonary vascularity are within normal limits. No adenopathy. Patient is status post coronary artery bypass grafting. There is atherosclerotic calcification in the aorta. Patient has undergone prior kyphoplasty at T8. IMPRESSION: Bibasilar atelectasis. Elevation right hemidiaphragm. No edema or consolidation. Aortic atherosclerosis. Status post coronary artery bypass grafting. Electronically Signed   By: Lowella Grip III M.D.   On: 01/04/2016 10:04   Scheduled Meds: . acyclovir  200 mg Oral BID  . carvedilol  12.5 mg Oral BID WC  . doxycycline  100 mg Oral Q12H  . famotidine  20 mg Oral Daily  . fluticasone  2 spray Each Nare Daily  . gabapentin  400 mg Oral BID  . guaiFENesin  600 mg Oral BID  . insulin aspart  0-9 Units Subcutaneous TID WC  . ipratropium-albuterol  3 mL Nebulization BID  . levETIRAcetam  500 mg Oral BID  . levothyroxine  50 mcg Oral  QAC breakfast  . lisinopril  2.5 mg Oral Daily  . methylPREDNISolone (SOLU-MEDROL) injection  60 mg Intravenous Q24H  . sertraline  50 mg Oral Daily  . simvastatin  10 mg Oral QHS  . sodium bicarbonate  650 mg Oral Daily   Continuous Infusions: . sodium chloride 75 mL/hr at 01/05/16 1904    LOS: 3 days    Kerney Elbe, DO [facility] Pager [phone]  If 7PM-7AM, please contact night-coverage [email or web address] Password [facility] 01/05/2016, 8:07 PM

## 2016-01-05 NOTE — Progress Notes (Addendum)
Physical Therapy Treatment Patient Details Name: Douglas Mccoy MRN: [id number] DOB: [birth year] Today's Date: 01/05/2016    History of Present Illness 70 yo male admitted with generalized weakness, fall, acute renal failure. Hx of COPD, DM, HTN, CVA, multiple myeloma, Sz. Pt is from a group home.    PT Comments    Pt continues to require Min assist for ambulation. LOB x 2 on today. He was also fatigued after walk. Recommended to pt that he use RW for ambulation safety. Also recommend HHPT follow up at group home.   Follow Up Recommendations  Home health PT;Supervision for mobility/OOB (at group home)     Equipment Recommendations  Rolling walker with 5" wheels    Recommendations for Other Services       Precautions / Restrictions Precautions Precautions: Fall Restrictions Weight Bearing Restrictions: No    Mobility  Bed Mobility Overal bed mobility: Modified Independent                Transfers Overall transfer level: Needs assistance   Transfers: Sit to/from Stand Sit to Stand: Min guard         General transfer comment: close guard for safety. Unsteady  Ambulation/Gait Ambulation/Gait assistance: Min assist Ambulation Distance (Feet): 200 Feet Assistive device: None Gait Pattern/deviations: Step-through pattern;Decreased stride length     General Gait Details: LOB x2 during ambulation on today.  Min assist to correct LOB. Fatigued after walk today.    Stairs            Wheelchair Mobility    Modified Rankin (Stroke Patients Only)       Balance Overall balance assessment: Needs assistance           Standing balance-Leahy Scale: Fair               High level balance activites: Side stepping;Backward walking;Turns;Sudden stops High Level Balance Comments: Min assist for side stepping, backwards walking, and turns.     Cognition Arousal/Alertness: Awake/alert Behavior During Therapy: WFL for tasks assessed/performed Overall  Cognitive Status: Within Functional Limits for tasks assessed                      Exercises  LE exercises Heel raises, 10 reps, standing, 1 hand support Hip Abd, 10 reps, standing, 1 hand support Mini-squats, 10 reps, standing, 1 hand support  General Comments        Pertinent Vitals/Pain Pain Assessment: Faces Faces Pain Scale: Hurts little more Pain Location: back Pain Descriptors / Indicators: Sore Pain Intervention(s): Monitored during session    Home Living                      Prior Function            PT Goals (current goals can now be found in the care plan section) Progress towards PT goals: Progressing toward goals    Frequency    Min 3X/week      PT Plan Current plan remains appropriate    Co-evaluation             End of Session Equipment Utilized During Treatment: Gait belt Activity Tolerance: Patient tolerated treatment well Patient left: in bed;with call bell/phone within reach;with bed alarm set (sitting at EOB)     Time: 7893-8101 PT Time Calculation (min) (ACUTE ONLY): 14 min  Charges:  $Gait Training: 8-22 mins  G Codes:      Weston Anna, MPT Pager: [phone]

## 2016-01-06 LAB — CBC WITH DIFFERENTIAL/PLATELET
Basophils Absolute: 0 10*3/uL (ref 0.0–0.1)
Basophils Relative: 0 %
EOS ABS: 0 10*3/uL (ref 0.0–0.7)
EOS PCT: 0 %
HCT: 31.9 % — ABNORMAL LOW (ref 39.0–52.0)
Hemoglobin: 10.7 g/dL — ABNORMAL LOW (ref 13.0–17.0)
LYMPHS ABS: 0.8 10*3/uL (ref 0.7–4.0)
LYMPHS PCT: 19 %
MCH: 27.3 pg (ref 26.0–34.0)
MCHC: 33.5 g/dL (ref 30.0–36.0)
MCV: 81.4 fL (ref 78.0–100.0)
MONO ABS: 0.3 10*3/uL (ref 0.1–1.0)
MONOS PCT: 8 %
Neutro Abs: 3.1 10*3/uL (ref 1.7–7.7)
Neutrophils Relative %: 73 %
PLATELETS: 126 10*3/uL — AB (ref 150–400)
RBC: 3.92 MIL/uL — AB (ref 4.22–5.81)
RDW: 16.2 % — ABNORMAL HIGH (ref 11.5–15.5)
WBC: 4.2 10*3/uL (ref 4.0–10.5)

## 2016-01-06 LAB — COMPREHENSIVE METABOLIC PANEL
ALT: 25 U/L (ref 17–63)
ANION GAP: 8 (ref 5–15)
AST: 21 U/L (ref 15–41)
Albumin: 3.7 g/dL (ref 3.5–5.0)
Alkaline Phosphatase: 64 U/L (ref 38–126)
BUN: 54 mg/dL — ABNORMAL HIGH (ref 6–20)
CALCIUM: 9 mg/dL (ref 8.9–10.3)
CHLORIDE: 107 mmol/L (ref 101–111)
CO2: 21 mmol/L — AB (ref 22–32)
CREATININE: 1.5 mg/dL — AB (ref 0.61–1.24)
GFR, EST AFRICAN AMERICAN: 52 mL/min — AB (ref >60)
GFR, EST NON AFRICAN AMERICAN: 45 mL/min — AB (ref >60)
Glucose, Bld: 194 mg/dL — ABNORMAL HIGH (ref 65–99)
Potassium: 5 mmol/L (ref 3.5–5.1)
SODIUM: 136 mmol/L (ref 135–145)
Total Bilirubin: 0.9 mg/dL (ref 0.3–1.2)
Total Protein: 7.5 g/dL (ref 6.5–8.1)

## 2016-01-06 LAB — GLUCOSE, CAPILLARY
GLUCOSE-CAPILLARY: 169 mg/dL — AB (ref 65–99)
GLUCOSE-CAPILLARY: 191 mg/dL — AB (ref 65–99)
Glucose-Capillary: 173 mg/dL — ABNORMAL HIGH (ref 65–99)

## 2016-01-06 LAB — PHOSPHORUS: PHOSPHORUS: 4.3 mg/dL (ref 2.5–4.6)

## 2016-01-06 LAB — MAGNESIUM: MAGNESIUM: 1.8 mg/dL (ref 1.7–2.4)

## 2016-01-06 MED ORDER — DOXYCYCLINE HYCLATE 100 MG PO TABS: 100.0000 mg | ORAL_TABLET | Freq: Two times a day (BID) | ORAL | 0 refills | Status: DC

## 2016-01-06 MED ORDER — GUAIFENESIN-DM 100-10 MG/5ML PO SYRP: 5.0000 mL | ORAL_SOLUTION | ORAL | 0 refills | Status: DC | PRN

## 2016-01-06 MED ORDER — FLUTICASONE PROPIONATE 50 MCG/ACT NA SUSP: 2.0000 | Freq: Every day | NASAL | 2 refills | Status: DC

## 2016-01-06 MED ORDER — GUAIFENESIN ER 600 MG PO TB12: 600.0000 mg | ORAL_TABLET | Freq: Two times a day (BID) | ORAL | 0 refills | Status: DC

## 2016-01-06 MED ORDER — BENZONATATE 100 MG PO CAPS
100.0000 mg | ORAL_CAPSULE | Freq: Three times a day (TID) | ORAL | Status: DC | PRN
  Administered 2016-01-06: 100 mg via ORAL
  Filled 2016-01-06: qty 1

## 2016-01-06 MED ORDER — BENZONATATE 100 MG PO CAPS: 100.0000 mg | ORAL_CAPSULE | Freq: Three times a day (TID) | ORAL | 0 refills | Status: DC | PRN

## 2016-01-06 NOTE — Discharge Summary (Signed)
Physician Discharge Summary  Douglas Mccoy VEH:[id number] DOB: 04/10/1945 DOA: 01/01/2016  PCP: [place] date: 01/01/2016 Discharge date: 01/06/2016  Admitted From: [place] Disposition:  [place] with Home PT  Recommendations for Outpatient Follow-up:  1. Follow up with PCP in 1-2 weeks 2. Follow up with Hematology/Oncology at [facility] or with [facility] 3. Please obtain BMP/CBC in one week  Home Health: YES Equipment/Devices: Conservation officer, nature  Discharge Condition: Stable CODE STATUS: FULL Diet recommendation: Heart Healthy / Carb Modified   Brief/Interim Summary: Douglas Mccoy a 70 y.o.malewith hypertension, COPD, diabetes mellitus, multiple myeloma in remission presents to the ER because of weakness. Patient stated over the last 2 days he has been feeling weak and early in the morning while walking to the bathroom patient felt weak and fell. Denies hitting his head or losing consciousness. He states he had been lying on the floor at least for an hour or 2. Later he [place] and went back to the bed. He was shopping in the evening at [facility] when he felt very weak and went back home. After reaching home since his weakness persisted he called EMS. In the ER patient was found to be hypotensive and was given fluid bolus. In addition labs show worsening renal function and anemia with mild worsening from previous. Patient otherwise denies any chest pain shortness of breath. Has chronic diarrhea.Also admits to Cough which has worsened. Admitted for weakness and Hypotension and now being treated for Cough/Wheezing. Was found to have a Viral URI with Metapnuemovirus and treated with Supportive Care and Abx for suspected COPD worsening. He steadily improved and worked with PT who recommended going home with Home PT and a Rolling walker. Patient's cough improved and he felt stronger and was medically stable to be D/C'd Home.   Discharge  Diagnoses:  Principal Problem:   Generalized weakness Active Problems:   Acute renal failure (ARF) (HCC)   Type II diabetes mellitus with peripheral circulatory disorder (HCC)   Hypertension   Multiple myeloma (HCC)   Hx of CABG   Dehydration   Weakness generalized  Cough/running nose and wheezing/COPD exacerbation: -He reports cough started three days ago, he is up to date with flu vaccine, he is a past smoker,  -Initial CXR no infiltrate, no fever, no leukocytosis -Respiratory panel Positive for Metapneumovirus -Was given solumedrol/flonase/mucinex/nebs/doxycycline ( to cover possible bronchitis) -Repeat CXR yesterday showed Bibasilar atelectasis. Elevation right hemidiaphragm. No edema or consolidation. Aortic atherosclerosis. Status post coronary artery bypass grafting. -Steadily improved and given Flonase, Mucinex, and Doxycyline at D/C  Acute Kidney Injury -Renal US in October no Obstructive nephropathy, -Urinalysis Negative  -BUN/Cr went from 26/1.15 -> 33/1.24 -> 54/1.36 -> 54/1.50 -Restarted Home Lisinopril of 2.5 mg po daily and Clonidine -Avoid other Nephrotoxics -Repeat CMP as an outpatient  Generalized Weakness and Deconditioning with Hypotension -S/p Fluid Boluses and Hypotension resolved -Am cortisol level wnl (tested prior to starting steroid) -C/w Home BP Meds -Per Patient he was taken of Amlodipine by PCP -PT updated Recc's and recommend going back to Group Home with [place]: -Hb/Hct was 10.7/31.9 this AM, Plt Count 126 - from acute URI? Continue monitor, on scd  - Spep /upep due to h/o multiple myeloma - Had M Spike - Repeat CBC as an outpatient - Outpatient follow up with Dr. Alvy Bimler in Hematology/Oncology or [facility] in [place]  Multiple myelomas/p bone marrow transplant at [facility] in [place].  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 Lindi Adie and will need to follow up with Oncology as an outpatient with Dr. Alvy Bimler or at his Hematologist in [place]   Chronic Diarrhea -C/w Loperamide 2 mg po TIDprn Diarrhea or Loose Stools.   CAD sp CABG with perioperative CVA in 2004,  -Currently stable. -No chest pain, no acute EKG changes,  -continue Aspirin, Coreg, Statin  Diabetes Mellitus Type 2 -Hemoglobin A1c 7.   -C/w Home SSI  History CVA x2 with left finger numbness, poor memories  -Currently stable. -Continue Aspirin and Simvastatin 10 mg po qHS  History of Seizure Disorder  - Continue Keppra 500 mg po BID,  - Last seizure one and half month ago - Follow up with Neurologist as an outpatient.   Hypothyroidism: -TSH was 2.521 -C/w Synthroid 50 mcg Daily  Discharge Instructions  Discharge Instructions    Call MD for:  difficulty breathing, headache or visual disturbances    Complete by:  As directed    Call MD for:  persistant dizziness or light-headedness    Complete by:  As directed    Call MD for:  persistant nausea and vomiting    Complete by:  As directed    Call MD for:  severe uncontrolled pain    Complete by:  As directed    Call MD for:  temperature >100.4    Complete by:  As directed    Diet - low sodium heart healthy    Complete by:  As directed    Discharge instructions    Complete by:  As directed    Follow up with PCP at [facility] in [place] and follow up with Oncology in [place]. Take all medications as prescribed. If symptoms worsen or change please return to the ER for evaluation.   Increase activity slowly    Complete by:  As directed      Allergies as of 01/06/2016      Reactions   Amoxapine And Related Other (See Comments)   unknown   Augmentin [amoxicillin-pot Clavulanate] Other (See Comments)   Unknown reaction Has patient had a PCN reaction causing  immediate rash, facial/tongue/throat swelling, SOB or lightheadedness with hypotension: unknown Has patient had a PCN reaction causing severe rash involving mucus membranes or skin necrosis: unknown Has patient had a PCN reaction that required hospitalization: unknown Has patient had a PCN reaction occurring within the last 10 years: no If all of the above answers are "NO", then may proceed with Cephalosporin use.      Medication List    STOP taking these medications   amLODipine 10 MG tablet Commonly known as:  NORVASC     TAKE these medications   acyclovir 200 MG capsule Commonly known as:  ZOVIRAX Take 200 mg by mouth 2 (two) times daily.   albuterol 108 (90 Base) MCG/ACT inhaler Commonly known as:  PROVENTIL HFA;VENTOLIN HFA Inhale 1-2 puffs into the lungs every 8 (eight) hours as needed for wheezing or shortness of breath.   aspirin EC 81 MG tablet Take 81 mg by mouth daily.   benzonatate 100 MG capsule Commonly known as:  TESSALON Take 1 capsule (100 mg total) by mouth 3 (three) times daily as needed for cough.   carvedilol 12.5 MG tablet Commonly known as:  COREG Take 1 tablet (12.5 mg total) by mouth 2 (two) times daily with  a meal.   cloNIDine 0.1 MG tablet Commonly known as:  CATAPRES Take 1 tablet (0.1 mg total) by mouth at bedtime.   doxycycline 100 MG tablet Commonly known as:  VIBRA-TABS Take 1 tablet (100 mg total) by mouth every 12 (twelve) hours.   famotidine 20 MG tablet Commonly known as:  PEPCID Take 1 tablet (20 mg total) by mouth daily.   fluticasone 50 MCG/ACT nasal spray Commonly known as:  FLONASE Place 2 sprays into both nostrils daily. Start taking on:  01/07/2016   gabapentin 300 MG capsule Commonly known as:  NEURONTIN Take 300 mg by mouth 2 (two) times daily.   guaiFENesin 600 MG 12 hr tablet Commonly known as:  MUCINEX Take 1 tablet (600 mg total) by mouth 2 (two) times daily.   guaiFENesin-dextromethorphan 100-10 MG/5ML  syrup Commonly known as:  ROBITUSSIN DM Take 5 mLs by mouth every 4 (four) hours as needed for cough.   insulin aspart 100 UNIT/ML injection Commonly known as:  novoLOG Inject into the skin. Inject as per sliding scale: if  0-139= 0 ; 140-199 = 2 units, 200-250= 4 units; 251-299= 6 units; 300-349= 8 units; 350-450= 10 units, subcutaneously before meals related to DM. Call MD for CBG less than 60 or greater than 450   levETIRAcetam 500 MG tablet Commonly known as:  KEPPRA Take 500 mg by mouth 2 (two) times daily.   levothyroxine 50 MCG tablet Commonly known as:  SYNTHROID Take 1 tablet (50 mcg total) by mouth daily before breakfast.   lisinopril 2.5 MG tablet Commonly known as:  PRINIVIL,ZESTRIL Take 2.5 mg by mouth daily.   loperamide 2 MG capsule Commonly known as:  IMODIUM Take 1 capsule (2 mg total) by mouth 3 (three) times daily as needed for diarrhea or loose stools.   sertraline 50 MG tablet Commonly known as:  ZOLOFT Take 50 mg by mouth daily.   simvastatin 10 MG tablet Commonly known as:  ZOCOR Take 10 mg by mouth at bedtime.   sodium bicarbonate 650 MG tablet Take 1 tablet (650 mg total) by mouth daily.            Durable Medical Equipment        Start     Ordered   01/06/16 1105  For home use only DME Walker rolling  Once    Question:  Patient needs a walker to treat with the following condition  Answer:  Generalized weakness   01/06/16 1104      Allergies  Allergen Reactions  . Amoxapine And Related Other (See Comments)    unknown  . Augmentin [Amoxicillin-Pot Clavulanate] Other (See Comments)    Unknown reaction Has patient had a PCN reaction causing immediate rash, facial/tongue/throat swelling, SOB or lightheadedness with hypotension: unknown Has patient had a PCN reaction causing severe rash involving mucus membranes or skin necrosis: unknown Has patient had a PCN reaction that required hospitalization: unknown Has patient had a PCN reaction  occurring within the last 10 years: no If all of the above answers are "NO", then may proceed with Cephalosporin use.     Consultations: None  Procedures/Studies: Dg Chest 2 View  Result Date: 01/04/2016 CLINICAL DATA:  Shortness of breath and cough EXAM: CHEST  2 VIEW COMPARISON:  January 01, 2016 FINDINGS: There is stable elevation of the left hemidiaphragm. There is mild bibasilar atelectasis. There is no edema or consolidation. Heart size and pulmonary vascularity are within normal limits. No adenopathy. Patient is status post coronary  artery bypass grafting. There is atherosclerotic calcification in the aorta. Patient has undergone prior kyphoplasty at T8. IMPRESSION: Bibasilar atelectasis. Elevation right hemidiaphragm. No edema or consolidation. Aortic atherosclerosis. Status post coronary artery bypass grafting. Electronically Signed   By: Lowella Grip III M.D.   On: 01/04/2016 10:04   Dg Chest 2 View  Result Date: 12/08/2015 CLINICAL DATA:  Hypertension. Shortness of breath and dizzy spells. History of COPD and diabetes. EXAM: CHEST  2 VIEW COMPARISON:  None. FINDINGS: Postsurgical changes from CABG. Cardiomediastinal silhouette is normal. Mediastinal contours appear intact. There is fullness of the left hilum. Eventration of the right hemidiaphragm is noted. There is tortuosity and calcific atherosclerotic disease of the thoracic aorta. There is no evidence of focal airspace consolidation, pleural effusion or pneumothorax. Osseous structures are without acute abnormality. Compression deformity post vertebroplasty of T8 vertebral body noted. Soft tissues are grossly normal. IMPRESSION: Fullness of the left hilum. Differential diagnosis includes overlapping vascular shadows, left hilar adenopathy or left hilar mass. Correlation with cross-sectional imaging of the chest will be useful. Calcific atherosclerotic disease of the aorta. Electronically Signed   By: Fidela Salisbury M.D.    On: 12/08/2015 16:24   Ct Chest Wo Contrast  Result Date: 12/08/2015 CLINICAL DATA:  Left hilar fullness on recent chest x-ray EXAM: CT CHEST WITHOUT CONTRAST TECHNIQUE: Multidetector CT imaging of the chest was performed following the standard protocol without IV contrast. COMPARISON:  Chest film from earlier in the same day FINDINGS: Cardiovascular: Diffuse aortic calcifications are noted. Calcifications in the brachiocephalic vessels are noted as well. Heavy coronary calcifications are seen as well as changes of prior coronary bypass grafting. Mild prominence of the ascending aorta is noted to 4 cm at the level the pulmonary artery. Mediastinum/Nodes: The thoracic inlet is within normal limits. No hilar or mediastinal adenopathy is noted. The fullness seen on the recent chest x-ray is related to a confluence of parenchymal and vascular shadows as opposed to a true mass. Lungs/Pleura: The lungs are well aerated bilaterally with diffuse emphysematous changes. Mild bibasilar atelectatic changes are seen. No focal mass lesion is noted. Upper Abdomen: Visualized upper abdomen shows no acute abnormality. Musculoskeletal: Degenerative changes of the thoracic spine are seen. Changes of prior vertebral augmentation are noted at T8. Chronic compression deformity of T11 is noted. IMPRESSION: Mild prominence of the ascending aorta to 4 cm. Recommend annual imaging followup by CTA or MRA. This recommendation follows 2010 ACCF/AHA/AATS/ACR/ASA/SCA/SCAI/SIR/STS/SVM Guidelines for the Diagnosis and Management of Patients with Thoracic Aortic Disease. Circulation. 2010; 121: G818-H631 The abnormality seen on recent chest x-ray is not related to the mass but to a confluence of vascular parenchymal shadows. Emphysematous changes. Electronically Signed   By: Inez Catalina M.D.   On: 12/08/2015 17:47   Dg Chest Portable 1 View  Result Date: 01/01/2016 CLINICAL DATA:  70 year old male with issues with low blood pressure for  about 3 days. Normally takes high blood pressure medication. Diabetic. Prior history smoking. Initial encounter. EXAM: PORTABLE CHEST 1 VIEW COMPARISON:  12/08/2015 CT and chest x-ray. FINDINGS: Chronic elevated right hemidiaphragm. Central pulmonary vascular prominence stable without pulmonary edema, segmental infiltrate or pneumothorax. Calcified tortuous aorta.  Please see CT report recommendations. Heart size top-normal.  Coronary artery calcifications. T8 compression fracture treated with cement augmentation. IMPRESSION: No acute pulmonary abnormality. Calcified tortuous aorta. Please see recent CT report recommendations regarding follow-up. If there is any clinical suspicion of aortic abnormality contributing to low blood pressure then CT imaging would be necessary  for further delineation. By plain film, findings appear stable. Electronically Signed   By: Genia Del M.D.   On: 01/01/2016 18:37     Subjective: Seen and examined at bedside and was doing better. Still had cough but weakness had resolved. No N/V and tolerating food well. States he felt stronger today than he has been.   Discharge Exam: Vitals:   01/06/16 0622 01/06/16 1415  BP: (!) 146/72 120/61  Pulse: 68 68  Resp: 20 16  Temp: 98.1 F (36.7 C) 98.6 F (37 C)   Vitals:   01/05/16 2055 01/06/16 0622 01/06/16 1055 01/06/16 1415  BP: (!) 139/57 (!) 146/72  120/61  Pulse: 69 68  68  Resp: 18 20  16   Temp: 98 F (36.7 C) 98.1 F (36.7 C)  98.6 F (37 C)  TempSrc: Oral Oral  Oral  SpO2: 94% 96% 94% 97%  Weight:  64.9 kg (143 lb 1.3 oz)    Height:       General: Pt is alert, awake, not in acute distress Cardiovascular: RRR, S1/S2 +, no rubs, no gallops Respiratory: CTA bilaterally, no wheezing, no rhonchi; Non-productive cough Abdominal: Soft, NT, ND, bowel sounds + Extremities: no edema, no cyanosis  The results of significant diagnostics from this hospitalization (including imaging, microbiology, ancillary and  laboratory) are listed below for reference.    Microbiology: Recent Results (from the past 240 hour(s))  MRSA PCR Screening     Status: None   Collection Time: 01/02/16  6:31 AM  Result Value Ref Range Status   MRSA by PCR NEGATIVE NEGATIVE Final    Comment:        The GeneXpert MRSA Assay (FDA approved for NASAL specimens only), is one component of a comprehensive MRSA colonization surveillance program. It is not intended to diagnose MRSA infection nor to guide or monitor treatment for MRSA infections.   Urine culture     Status: Abnormal   Collection Time: 01/02/16 12:35 PM  Result Value Ref Range Status   Specimen Description URINE, RANDOM  Final   Special Requests NONE  Final   Culture (A)  Final    <10,000 COLONIES/mL INSIGNIFICANT GROWTH Performed at [facility]    Report Status 01/03/2016 FINAL  Final  Respiratory Panel by PCR     Status: Abnormal   Collection Time: 01/02/16  1:11 PM  Result Value Ref Range Status   Adenovirus NOT DETECTED NOT DETECTED Final   Coronavirus 229E NOT DETECTED NOT DETECTED Final   Coronavirus HKU1 NOT DETECTED NOT DETECTED Final   Coronavirus NL63 NOT DETECTED NOT DETECTED Final   Coronavirus OC43 NOT DETECTED NOT DETECTED Final   Metapneumovirus DETECTED (A) NOT DETECTED Final   Rhinovirus / Enterovirus NOT DETECTED NOT DETECTED Final   Influenza A NOT DETECTED NOT DETECTED Final   Influenza B NOT DETECTED NOT DETECTED Final   Parainfluenza Virus 1 NOT DETECTED NOT DETECTED Final   Parainfluenza Virus 2 NOT DETECTED NOT DETECTED Final   Parainfluenza Virus 3 NOT DETECTED NOT DETECTED Final   Parainfluenza Virus 4 NOT DETECTED NOT DETECTED Final   Respiratory Syncytial Virus NOT DETECTED NOT DETECTED Final   Bordetella pertussis NOT DETECTED NOT DETECTED Final   Chlamydophila pneumoniae NOT DETECTED NOT DETECTED Final   Mycoplasma pneumoniae NOT DETECTED NOT DETECTED Final    Labs: BNP (last 3 results) No results for  input(s): BNP in the last 8760 hours. Basic Metabolic Panel:  Recent Labs Lab 01/02/16 0548 01/03/16 0602 01/04/16  0535 01/05/16 0532 01/06/16 0552  NA 140 137 134* 133* 136  K 4.7 4.9 4.8 4.7 5.0  CL 112* 109 104 103 107  CO2 22 20* 21* 22 21*  GLUCOSE 170* 219* 200* 212* 194*  BUN 35* 26* 33* 54* 54*  CREATININE 1.52* 1.15 1.24 1.36* 1.50*  CALCIUM 8.4* 8.6* 9.2 9.8 9.0  MG  --  1.6* 1.7 1.9 1.8  PHOS  --   --  3.5 4.3 4.3   Liver Function Tests:  Recent Labs Lab 01/01/16 1724 01/04/16 0535 01/05/16 0532 01/06/16 0552  AST 17 16 18 21   ALT 18 14* 17 25  ALKPHOS 78 76 68 64  BILITOT 0.8 1.0 0.7 0.9  PROT 7.5 8.3* 8.1 7.5  ALBUMIN 3.9 4.0 4.0 3.7   No results for input(s): LIPASE, AMYLASE in the last 168 hours. No results for input(s): AMMONIA in the last 168 hours. CBC:  Recent Labs Lab 01/02/16 0548 01/03/16 0602 01/04/16 0535 01/05/16 0532 01/06/16 0552  WBC 7.3 5.1 4.2 4.2 4.2  NEUTROABS  --  4.0 3.1 3.1 3.1  HGB 9.7* 10.0* 11.0* 11.3* 10.7*  HCT 29.0* 29.9* 32.8* 32.6* 31.9*  MCV 82.2 82.8 81.8 79.3 81.4  PLT 84* 94* 114* 134* 126*   Cardiac Enzymes:  Recent Labs Lab 01/01/16 2129  TROPONINI <0.03   BNP: Invalid input(s): POCBNP CBG:  Recent Labs Lab 01/05/16 1618 01/05/16 2054 01/06/16 0728 01/06/16 1121 01/06/16 1647  GLUCAP 192* 355* 169* 173* 191*   D-Dimer No results for input(s): DDIMER in the last 72 hours. Hgb A1c No results for input(s): HGBA1C in the last 72 hours. Lipid Profile No results for input(s): CHOL, HDL, LDLCALC, TRIG, CHOLHDL, LDLDIRECT in the last 72 hours. Thyroid function studies No results for input(s): TSH, T4TOTAL, T3FREE, THYROIDAB in the last 72 hours.  Invalid input(s): FREET3 Anemia work up No results for input(s): VITAMINB12, FOLATE, FERRITIN, TIBC, IRON, RETICCTPCT in the last 72 hours. Urinalysis    Component Value Date/Time   COLORURINE YELLOW 01/02/2016 [place]  01/02/2016 1235   LABSPEC 1.012 01/02/2016 1235   PHURINE 5.0 01/02/2016 1235   GLUCOSEU >=500 (A) 01/02/2016 1235   HGBUR SMALL (A) 01/02/2016 1235   BILIRUBINUR NEGATIVE 01/02/2016 1235   KETONESUR NEGATIVE 01/02/2016 1235   PROTEINUR 30 (A) 01/02/2016 1235   NITRITE NEGATIVE 01/02/2016 1235   LEUKOCYTESUR NEGATIVE 01/02/2016 1235   Sepsis Labs Invalid input(s): PROCALCITONIN,  WBC,  LACTICIDVEN Microbiology Recent Results (from the past 240 hour(s))  MRSA PCR Screening     Status: None   Collection Time: 01/02/16  6:31 AM  Result Value Ref Range Status   MRSA by PCR NEGATIVE NEGATIVE Final    Comment:        The GeneXpert MRSA Assay (FDA approved for NASAL specimens only), is one component of a comprehensive MRSA colonization surveillance program. It is not intended to diagnose MRSA infection nor to guide or monitor treatment for MRSA infections.   Urine culture     Status: Abnormal   Collection Time: 01/02/16 12:35 PM  Result Value Ref Range Status   Specimen Description URINE, RANDOM  Final   Special Requests NONE  Final   Culture (A)  Final    <10,000 COLONIES/mL INSIGNIFICANT GROWTH Performed at [facility]    Report Status 01/03/2016 FINAL  Final  Respiratory Panel by PCR     Status: Abnormal   Collection Time: 01/02/16  1:11 PM  Result Value  Ref Range Status   Adenovirus NOT DETECTED NOT DETECTED Final   Coronavirus 229E NOT DETECTED NOT DETECTED Final   Coronavirus HKU1 NOT DETECTED NOT DETECTED Final   Coronavirus NL63 NOT DETECTED NOT DETECTED Final   Coronavirus OC43 NOT DETECTED NOT DETECTED Final   Metapneumovirus DETECTED (A) NOT DETECTED Final   Rhinovirus / Enterovirus NOT DETECTED NOT DETECTED Final   Influenza A NOT DETECTED NOT DETECTED Final   Influenza B NOT DETECTED NOT DETECTED Final   Parainfluenza Virus 1 NOT DETECTED NOT DETECTED Final   Parainfluenza Virus 2 NOT DETECTED NOT DETECTED Final   Parainfluenza Virus 3 NOT DETECTED  NOT DETECTED Final   Parainfluenza Virus 4 NOT DETECTED NOT DETECTED Final   Respiratory Syncytial Virus NOT DETECTED NOT DETECTED Final   Bordetella pertussis NOT DETECTED NOT DETECTED Final   Chlamydophila pneumoniae NOT DETECTED NOT DETECTED Final   Mycoplasma pneumoniae NOT DETECTED NOT DETECTED Final   Time coordinating discharge: Over 30 minutes  SIGNED:  Kerney Elbe, DO [facility] 01/06/2016, 6:23 PM Pager [phone]  If 7PM-7AM, please contact night-coverage [email or web address] Password [email or web address]

## 2016-01-06 NOTE — Progress Notes (Signed)
Pt leaving this evening via taxi, headed to [facility]. Pt alert, oriented, and without c/o. Discharge instructions/prescriptions given/explained with pt verbalizing understanding.

## 2016-01-10 ENCOUNTER — Inpatient Hospital Stay (HOSPITAL_COMMUNITY)
Admission: EM | Admit: 2016-01-10 | Discharge: 2016-01-12 | DRG: 682 | Disposition: A | Discharge status: Home / Self Care | Payer: Medicare Other | Attending: Family Medicine | Admitting: Family Medicine

## 2016-01-10 ENCOUNTER — Observation Stay (HOSPITAL_COMMUNITY): Payer: Medicare Other

## 2016-01-10 ENCOUNTER — Encounter (HOSPITAL_COMMUNITY): Payer: Self-pay | Admitting: Emergency Medicine

## 2016-01-10 DIAGNOSIS — Z91128 Patient's intentional underdosing of medication regimen for other reason: Secondary | ICD-10-CM

## 2016-01-10 DIAGNOSIS — E440 Moderate protein-calorie malnutrition: Secondary | ICD-10-CM | POA: Diagnosis present

## 2016-01-10 DIAGNOSIS — F32A Depression, unspecified: Secondary | ICD-10-CM | POA: Diagnosis present

## 2016-01-10 DIAGNOSIS — E872 Acidosis: Secondary | ICD-10-CM | POA: Diagnosis present

## 2016-01-10 DIAGNOSIS — Z8673 Personal history of transient ischemic attack (TIA), and cerebral infarction without residual deficits: Secondary | ICD-10-CM

## 2016-01-10 DIAGNOSIS — G8929 Other chronic pain: Secondary | ICD-10-CM | POA: Diagnosis present

## 2016-01-10 DIAGNOSIS — I10 Essential (primary) hypertension: Secondary | ICD-10-CM | POA: Diagnosis present

## 2016-01-10 DIAGNOSIS — J189 Pneumonia, unspecified organism: Secondary | ICD-10-CM | POA: Diagnosis present

## 2016-01-10 DIAGNOSIS — Z7982 Long term (current) use of aspirin: Secondary | ICD-10-CM

## 2016-01-10 DIAGNOSIS — B029 Zoster without complications: Secondary | ICD-10-CM | POA: Diagnosis present

## 2016-01-10 DIAGNOSIS — M545 Low back pain, unspecified: Secondary | ICD-10-CM | POA: Diagnosis present

## 2016-01-10 DIAGNOSIS — D638 Anemia in other chronic diseases classified elsewhere: Secondary | ICD-10-CM | POA: Diagnosis present

## 2016-01-10 DIAGNOSIS — M542 Cervicalgia: Secondary | ICD-10-CM | POA: Diagnosis present

## 2016-01-10 DIAGNOSIS — K529 Noninfective gastroenteritis and colitis, unspecified: Secondary | ICD-10-CM | POA: Diagnosis present

## 2016-01-10 DIAGNOSIS — E860 Dehydration: Secondary | ICD-10-CM | POA: Diagnosis present

## 2016-01-10 DIAGNOSIS — T483X6A Underdosing of antitussives, initial encounter: Secondary | ICD-10-CM | POA: Diagnosis present

## 2016-01-10 DIAGNOSIS — Z9484 Stem cells transplant status: Secondary | ICD-10-CM

## 2016-01-10 DIAGNOSIS — E039 Hypothyroidism, unspecified: Secondary | ICD-10-CM | POA: Diagnosis present

## 2016-01-10 DIAGNOSIS — R531 Weakness: Secondary | ICD-10-CM

## 2016-01-10 DIAGNOSIS — J449 Chronic obstructive pulmonary disease, unspecified: Secondary | ICD-10-CM | POA: Diagnosis present

## 2016-01-10 DIAGNOSIS — Y92009 Unspecified place in unspecified non-institutional (private) residence as the place of occurrence of the external cause: Secondary | ICD-10-CM

## 2016-01-10 DIAGNOSIS — E1151 Type 2 diabetes mellitus with diabetic peripheral angiopathy without gangrene: Secondary | ICD-10-CM | POA: Diagnosis not present

## 2016-01-10 DIAGNOSIS — K219 Gastro-esophageal reflux disease without esophagitis: Secondary | ICD-10-CM | POA: Diagnosis present

## 2016-01-10 DIAGNOSIS — D649 Anemia, unspecified: Secondary | ICD-10-CM | POA: Diagnosis present

## 2016-01-10 DIAGNOSIS — T364X6A Underdosing of tetracyclines, initial encounter: Secondary | ICD-10-CM | POA: Diagnosis present

## 2016-01-10 DIAGNOSIS — C9001 Multiple myeloma in remission: Secondary | ICD-10-CM | POA: Diagnosis present

## 2016-01-10 DIAGNOSIS — N179 Acute kidney failure, unspecified: Secondary | ICD-10-CM | POA: Diagnosis not present

## 2016-01-10 DIAGNOSIS — I959 Hypotension, unspecified: Secondary | ICD-10-CM | POA: Diagnosis present

## 2016-01-10 DIAGNOSIS — Z833 Family history of diabetes mellitus: Secondary | ICD-10-CM

## 2016-01-10 DIAGNOSIS — I2510 Atherosclerotic heart disease of native coronary artery without angina pectoris: Secondary | ICD-10-CM | POA: Diagnosis present

## 2016-01-10 DIAGNOSIS — R55 Syncope and collapse: Secondary | ICD-10-CM

## 2016-01-10 DIAGNOSIS — Z87891 Personal history of nicotine dependence: Secondary | ICD-10-CM

## 2016-01-10 DIAGNOSIS — Z951 Presence of aortocoronary bypass graft: Secondary | ICD-10-CM

## 2016-01-10 DIAGNOSIS — Z794 Long term (current) use of insulin: Secondary | ICD-10-CM

## 2016-01-10 DIAGNOSIS — F329 Major depressive disorder, single episode, unspecified: Secondary | ICD-10-CM | POA: Diagnosis present

## 2016-01-10 DIAGNOSIS — Z79899 Other long term (current) drug therapy: Secondary | ICD-10-CM

## 2016-01-10 DIAGNOSIS — G40909 Epilepsy, unspecified, not intractable, without status epilepticus: Secondary | ICD-10-CM | POA: Diagnosis present

## 2016-01-10 DIAGNOSIS — J440 Chronic obstructive pulmonary disease with acute lower respiratory infection: Secondary | ICD-10-CM | POA: Diagnosis present

## 2016-01-10 DIAGNOSIS — E785 Hyperlipidemia, unspecified: Secondary | ICD-10-CM | POA: Diagnosis present

## 2016-01-10 HISTORY — DX: Dorsalgia, unspecified: M54.9

## 2016-01-10 HISTORY — DX: Personal history of (healed) traumatic fracture: Z87.81

## 2016-01-10 HISTORY — DX: Chronic kidney disease, unspecified: N18.9

## 2016-01-10 HISTORY — DX: Anxiety disorder, unspecified: F41.9

## 2016-01-10 HISTORY — DX: Unspecified chronic bronchitis: J42

## 2016-01-10 HISTORY — DX: Personal history of other diseases of the digestive system: Z87.19

## 2016-01-10 HISTORY — DX: Cardiac murmur, unspecified: R01.1

## 2016-01-10 HISTORY — DX: Type 2 diabetes mellitus without complications: E11.9

## 2016-01-10 HISTORY — DX: Other chronic pain: G89.29

## 2016-01-10 HISTORY — DX: Sleep apnea, unspecified: G47.30

## 2016-01-10 HISTORY — DX: Anemia, unspecified: D64.9

## 2016-01-10 HISTORY — DX: Acute myocardial infarction, unspecified: I21.9

## 2016-01-10 HISTORY — DX: Unspecified osteoarthritis, unspecified site: M19.90

## 2016-01-10 LAB — UIFE/LIGHT CHAINS/TP QN, 24-HR UR
% BETA, Urine: UNDETERMINED %
ALPHA 1 URINE: UNDETERMINED %
Albumin, U: UNDETERMINED %
Alpha 2, Urine: UNDETERMINED %
Free Kappa/Lambda Ratio: 83.24 — ABNORMAL HIGH (ref 2.04–10.37)
Free Lambda Lt Chains,Ur: 10.8 mg/L — ABNORMAL HIGH (ref 0.24–6.66)
Free Lt Chn Excr Rate: 899 mg/L — ABNORMAL HIGH (ref 1.35–24.19)
GAMMA GLOBULIN URINE: UNDETERMINED %
IMMUNOFIXATION RESULT, URINE: UNDETERMINED
M-SPIKE %, URINE: UNDETERMINED %
TOTAL PROTEIN, URINE-UPE24: UNDETERMINED mg/dL
TOTAL VOLUME: 3000
Total Protein, Urine-Ur/day: UNDETERMINED mg/24 hr

## 2016-01-10 LAB — I-STAT TROPONIN, ED: TROPONIN I, POC: 0.01 ng/mL (ref 0.00–0.08)

## 2016-01-10 LAB — CBC WITH DIFFERENTIAL/PLATELET
Basophils Absolute: 0 10*3/uL (ref 0.0–0.1)
Basophils Relative: 0 %
Eosinophils Absolute: 0 10*3/uL (ref 0.0–0.7)
Eosinophils Relative: 0 %
HEMATOCRIT: 27.3 % — AB (ref 39.0–52.0)
HEMOGLOBIN: 9.1 g/dL — AB (ref 13.0–17.0)
LYMPHS ABS: 1.8 10*3/uL (ref 0.7–4.0)
LYMPHS PCT: 21 %
MCH: 27 pg (ref 26.0–34.0)
MCHC: 33.3 g/dL (ref 30.0–36.0)
MCV: 81 fL (ref 78.0–100.0)
Monocytes Absolute: 1 10*3/uL (ref 0.1–1.0)
Monocytes Relative: 12 %
NEUTROS ABS: 5.8 10*3/uL (ref 1.7–7.7)
NEUTROS PCT: 67 %
Platelets: 87 10*3/uL — ABNORMAL LOW (ref 150–400)
RBC: 3.37 MIL/uL — AB (ref 4.22–5.81)
RDW: 16.2 % — ABNORMAL HIGH (ref 11.5–15.5)
WBC: 8.7 10*3/uL (ref 4.0–10.5)

## 2016-01-10 LAB — COMPREHENSIVE METABOLIC PANEL
ALK PHOS: 55 U/L (ref 38–126)
ALT: 17 U/L (ref 17–63)
AST: 14 U/L — AB (ref 15–41)
Albumin: 2.9 g/dL — ABNORMAL LOW (ref 3.5–5.0)
Anion gap: 6 (ref 5–15)
BUN: 63 mg/dL — AB (ref 6–20)
CALCIUM: 8.3 mg/dL — AB (ref 8.9–10.3)
CO2: 19 mmol/L — AB (ref 22–32)
CREATININE: 2.2 mg/dL — AB (ref 0.61–1.24)
Chloride: 113 mmol/L — ABNORMAL HIGH (ref 101–111)
GFR, EST AFRICAN AMERICAN: 33 mL/min — AB (ref >60)
GFR, EST NON AFRICAN AMERICAN: 28 mL/min — AB (ref >60)
Glucose, Bld: 148 mg/dL — ABNORMAL HIGH (ref 65–99)
Potassium: 4.3 mmol/L (ref 3.5–5.1)
Sodium: 138 mmol/L (ref 135–145)
Total Bilirubin: 0.5 mg/dL (ref 0.3–1.2)
Total Protein: 5.9 g/dL — ABNORMAL LOW (ref 6.5–8.1)

## 2016-01-10 LAB — I-STAT CG4 LACTIC ACID, ED: LACTIC ACID, VENOUS: 1.85 mmol/L (ref 0.5–1.9)

## 2016-01-10 LAB — GLUCOSE, CAPILLARY
GLUCOSE-CAPILLARY: 125 mg/dL — AB (ref 65–99)
Glucose-Capillary: 194 mg/dL — ABNORMAL HIGH (ref 65–99)

## 2016-01-10 LAB — TROPONIN I: Troponin I: <0.03 ng/mL (ref <0.03)

## 2016-01-10 MED ORDER — SIMVASTATIN 10 MG PO TABS
10.0000 mg | ORAL_TABLET | Freq: Every day | ORAL | Status: DC
  Administered 2016-01-10 – 2016-01-12 (×3): 10 mg via ORAL
  Filled 2016-01-10 (×3): qty 1

## 2016-01-10 MED ORDER — LEVETIRACETAM ER 500 MG PO TB24
500.0000 mg | ORAL_TABLET | Freq: Every day | ORAL | Status: DC
  Administered 2016-01-10 – 2016-01-12 (×3): 500 mg via ORAL
  Filled 2016-01-10 (×3): qty 1

## 2016-01-10 MED ORDER — ACYCLOVIR 200 MG PO CAPS
200.0000 mg | ORAL_CAPSULE | Freq: Two times a day (BID) | ORAL | Status: DC
  Administered 2016-01-10 – 2016-01-12 (×4): 200 mg via ORAL
  Filled 2016-01-10 (×5): qty 1

## 2016-01-10 MED ORDER — SODIUM CHLORIDE 0.9 % IV SOLN
INTRAVENOUS | Status: DC
  Administered 2016-01-10 – 2016-01-12 (×3): via INTRAVENOUS

## 2016-01-10 MED ORDER — ACETAMINOPHEN 325 MG PO TABS: 650.0000 mg | ORAL_TABLET | Freq: Four times a day (QID) | ORAL | Status: DC | PRN

## 2016-01-10 MED ORDER — CLONIDINE HCL 0.1 MG PO TABS: 0.1000 mg | ORAL_TABLET | Freq: Every day | ORAL | Status: DC

## 2016-01-10 MED ORDER — SODIUM CHLORIDE 0.9% FLUSH
3.0000 mL | Freq: Two times a day (BID) | INTRAVENOUS | Status: DC
  Administered 2016-01-10 – 2016-01-11 (×3): 3 mL via INTRAVENOUS

## 2016-01-10 MED ORDER — INSULIN ASPART 100 UNIT/ML ~~LOC~~ SOLN
0.0000 [IU] | Freq: Three times a day (TID) | SUBCUTANEOUS | Status: DC
  Administered 2016-01-11 (×3): 2 [IU] via SUBCUTANEOUS
  Administered 2016-01-12: 3 [IU] via SUBCUTANEOUS
  Administered 2016-01-12: 2 [IU] via SUBCUTANEOUS

## 2016-01-10 MED ORDER — SODIUM CHLORIDE 0.9 % IV BOLUS (SEPSIS)
1000.0000 mL | Freq: Once | INTRAVENOUS | Status: AC
  Administered 2016-01-10: 1000 mL via INTRAVENOUS

## 2016-01-10 MED ORDER — BENZONATATE 100 MG PO CAPS: 100.0000 mg | ORAL_CAPSULE | Freq: Three times a day (TID) | ORAL | Status: DC | PRN

## 2016-01-10 MED ORDER — BACID PO TABS
1.0000 | ORAL_TABLET | Freq: Two times a day (BID) | ORAL | Status: DC
  Administered 2016-01-10 – 2016-01-12 (×4): 1 via ORAL
  Filled 2016-01-10 (×5): qty 1

## 2016-01-10 MED ORDER — GABAPENTIN 300 MG PO CAPS
600.0000 mg | ORAL_CAPSULE | Freq: Two times a day (BID) | ORAL | Status: DC
  Administered 2016-01-10 – 2016-01-12 (×6): 600 mg via ORAL
  Filled 2016-01-10 (×6): qty 2

## 2016-01-10 MED ORDER — ASPIRIN 325 MG PO TABS
325.0000 mg | ORAL_TABLET | Freq: Every day | ORAL | Status: DC
  Administered 2016-01-11 – 2016-01-12 (×2): 325 mg via ORAL
  Filled 2016-01-10 (×2): qty 1

## 2016-01-10 MED ORDER — ACETAMINOPHEN 650 MG RE SUPP: 650.0000 mg | Freq: Four times a day (QID) | RECTAL | Status: DC | PRN

## 2016-01-10 MED ORDER — LOPERAMIDE HCL 2 MG PO CAPS
2.0000 mg | ORAL_CAPSULE | Freq: Three times a day (TID) | ORAL | Status: DC | PRN
  Administered 2016-01-10 – 2016-01-12 (×3): 2 mg via ORAL
  Filled 2016-01-10 (×3): qty 1

## 2016-01-10 MED ORDER — LISINOPRIL 10 MG PO TABS
20.0000 mg | ORAL_TABLET | Freq: Every day | ORAL | Status: DC
  Administered 2016-01-11 – 2016-01-12 (×2): 20 mg via ORAL
  Filled 2016-01-10 (×2): qty 2

## 2016-01-10 MED ORDER — GUAIFENESIN ER 600 MG PO TB12: 600.0000 mg | ORAL_TABLET | Freq: Two times a day (BID) | ORAL | Status: DC | PRN

## 2016-01-10 MED ORDER — OXYCODONE-ACETAMINOPHEN 5-325 MG PO TABS
1.0000 | ORAL_TABLET | Freq: Once | ORAL | Status: AC
  Administered 2016-01-10: 1 via ORAL
  Filled 2016-01-10: qty 1

## 2016-01-10 MED ORDER — LEVOTHYROXINE SODIUM 50 MCG PO TABS
50.0000 ug | ORAL_TABLET | Freq: Every day | ORAL | Status: DC
  Administered 2016-01-11 – 2016-01-12 (×2): 50 ug via ORAL
  Filled 2016-01-10 (×2): qty 1

## 2016-01-10 MED ORDER — SERTRALINE HCL 50 MG PO TABS
50.0000 mg | ORAL_TABLET | Freq: Every day | ORAL | Status: DC
  Administered 2016-01-10 – 2016-01-12 (×3): 50 mg via ORAL
  Filled 2016-01-10 (×3): qty 1

## 2016-01-10 NOTE — H&P (Signed)
History and Physical    Douglas Mccoy WJX:[id number] DOB: Nov 30, 1945 DOA: 01/10/2016   PCP: [facility]   Patient coming from:  Lives in [place]   Chief Complaint: Dizziness, presyncope   HPI: Douglas Mccoy is a 70 y.o. male with medical history significant for Multiple Myeloma on remission  s/p stem cell transplant, hypothyroidism, CVA, CAD, COPD, Hypertension, diabetes,recent history of  Viral URI with Metapneurovirus treated supportively , recently admitted for hypotension and  generalized weakness, improved with IV hydration  And adjustment of his BP meds. He returns with similar symptoms today. He reports feeling dizzy and presyncopal. Denies any vision changes, headaches. No dysarthria or dysphagia. Denies any unilateral weakness or sensory deficiencies. No falls. Denies any confusion.  He has a history of seizures and was on Keppra till last admission, no seizures on presentation  Denies any chest pain, palpitations, or shortness of breath. Denies any fever or chills, or night sweats. Denies any abdominal pain, or diarrhea.  Denies lower extremity swelling. Denies abnormal skin rashes, or neuropathy.    ED Course:  BP 124/60   Pulse (!) 53   Temp 98 F (36.7 C) (Oral)   Resp 13   Ht 5' 5"  (1.651 m)   Wt 64.9 kg (143 lb)   SpO2 95%   BMI 23.80 kg/m      Review of Systems: As per HPI otherwise 10 point review of systems negative.   Past Medical History:  Diagnosis Date  . CAD (coronary artery disease)   . COPD (chronic obstructive pulmonary disease) ([place])   . Depression   . Diabetes mellitus without complication ([place])   . GERD (gastroesophageal reflux disease)   . History of stem cell transplant ([place])   . Hyperlipidemia   . Hypertension   . Hypothyroidism   . Multiple myeloma ([place])   . Multiple myeloma ([place])   . Seizures ([place])   . Shingles   . Stroke [facility])     Past Surgical History:  Procedure Laterality Date  . BACK SURGERY     fracture  . BYPASS  GRAFT     x4  . CAROTID ARTERY ANGIOPLASTY     Right carotid artery "cleaned out"  . CHOLECYSTECTOMY      Social History Social History   Social History  . Marital status: Divorced    Spouse name: N/A  . Number of children: N/A  . Years of education: N/A   Occupational History  . Not on file.   Social History Main Topics  . Smoking status: Former Research scientist (life sciences)  . Smokeless tobacco: Never Used  . Alcohol use No  . Drug use: No  . Sexual activity: Not on file   Other Topics Concern  . Not on file   Social History Narrative  . No narrative on file     Allergies  Allergen Reactions  . Amoxapine And Related Other (See Comments)    unknown  . Amoxicillin Other (See Comments)    intolerance  . Augmentin [Amoxicillin-Pot Clavulanate] Other (See Comments)    Unknown reaction Has patient had a PCN reaction causing immediate rash, facial/tongue/throat swelling, SOB or lightheadedness with hypotension: unknown Has patient had a PCN reaction causing severe rash involving mucus membranes or skin necrosis: unknown Has patient had a PCN reaction that required hospitalization: unknown Has patient had a PCN reaction occurring within the last 10 years: no If all of the above answers are "NO", then may proceed with Cephalosporin use.     Family  History  Problem Relation Age of Onset  . Diabetes Mother   . Cancer Neg Hx   . Stroke Neg Hx       Prior to Admission medications   Medication Sig Start Date End Date Taking? Authorizing Provider  acyclovir (ZOVIRAX) 200 MG capsule Take 200 mg by mouth 2 (two) times daily.   Yes Historical Provider, MD  albuterol (PROVENTIL HFA;VENTOLIN HFA) 108 (90 Base) MCG/ACT inhaler Inhale 1-2 puffs into the lungs every 8 (eight) hours as needed for wheezing or shortness of breath.   Yes Historical Provider, MD  aspirin 325 MG tablet Take 325 mg by mouth daily.   Yes Historical Provider, MD  cloNIDine (CATAPRES) 0.1 MG tablet Take 1 tablet (0.1 mg  total) by mouth at bedtime. 10/23/15  Yes Florencia Reasons, MD  gabapentin (NEURONTIN) 300 MG capsule Take 600 mg by mouth 2 (two) times daily.    Yes Historical Provider, MD  guaiFENesin (MUCINEX) 600 MG 12 hr tablet Take 1 tablet (600 mg total) by mouth 2 (two) times daily. Patient taking differently: Take 600 mg by mouth every 12 (twelve) hours as needed for cough.  01/06/16  Yes [place], DO  guaiFENesin-dextromethorphan (ROBITUSSIN DM) 100-10 MG/5ML syrup Take 5 mLs by mouth every 4 (four) hours as needed for cough. 01/06/16  Yes [place], DO  insulin aspart (NOVOLOG) 100 UNIT/ML injection Inject into the skin. Inject as per sliding scale: if  0-139= 0 ; 140-199 = 2 units, 200-250= 4 units; 251-299= 6 units; 300-349= 8 units; 350-450= 10 units, subcutaneously before meals related to DM. Call MD for CBG less than 60 or greater than 450   Yes Historical Provider, MD  levothyroxine (SYNTHROID) 50 MCG tablet Take 1 tablet (50 mcg total) by mouth daily before breakfast. 10/23/15  Yes Florencia Reasons, MD  lisinopril (PRINIVIL,ZESTRIL) 20 MG tablet Take 20 mg by mouth daily.   Yes Historical Provider, MD  Magnesium Oxide 420 MG TABS Take 840 mg by mouth 2 (two) times daily.   Yes Historical Provider, MD  sertraline (ZOLOFT) 50 MG tablet Take 50 mg by mouth daily.   Yes Historical Provider, MD  simvastatin (ZOCOR) 10 MG tablet Take 10 mg by mouth at bedtime.   Yes Historical Provider, MD  sodium bicarbonate 650 MG tablet Take 1 tablet (650 mg total) by mouth daily. 10/24/15  Yes Florencia Reasons, MD  tiZANidine (ZANAFLEX) 4 MG tablet Take 4 mg by mouth every 8 (eight) hours as needed for muscle spasms.   Yes Historical Provider, MD  benzonatate (TESSALON) 100 MG capsule Take 1 capsule (100 mg total) by mouth 3 (three) times daily as needed for cough. 01/06/16   [place], DO  carvedilol (COREG) 12.5 MG tablet Take 1 tablet (12.5 mg total) by mouth 2 (two) times daily with a meal. Patient not taking:  Reported on 01/10/2016 10/23/15   Florencia Reasons, MD  doxycycline (VIBRA-TABS) 100 MG tablet Take 1 tablet (100 mg total) by mouth every 12 (twelve) hours. 01/06/16   [place], DO  famotidine (PEPCID) 20 MG tablet Take 1 tablet (20 mg total) by mouth daily. Patient not taking: Reported on 01/10/2016 10/23/15   Florencia Reasons, MD  fluticasone [facility]) 50 MCG/ACT nasal spray Place 2 sprays into both nostrils daily. 01/07/16   Kerney Elbe, DO  loperamide (IMODIUM) 2 MG capsule Take 1 capsule (2 mg total) by mouth 3 (three) times daily as needed for diarrhea or loose stools. Patient  not taking: Reported on 01/10/2016 10/23/15   Florencia Reasons, MD    Physical Exam:    Vitals:   01/10/16 1315 01/10/16 1345 01/10/16 1400 01/10/16 1415  BP: (!) 106/53 125/62 124/58 124/60  Pulse: (!) 52 (!) 54 (!) 52 (!) 53  Resp: 15 10 14 13   Temp:      TempSrc:      SpO2: 98% 97% 97% 95%  Weight:      Height:           Constitutional: NAD, calm, comfortable  Vitals:   01/10/16 1315 01/10/16 1345 01/10/16 1400 01/10/16 1415  BP: (!) 106/53 125/62 124/58 124/60  Pulse: (!) 52 (!) 54 (!) 52 (!) 53  Resp: 15 10 14 13   Temp:      TempSrc:      SpO2: 98% 97% 97% 95%  Weight:      Height:       Eyes: PERRL, lids and conjunctivae normal ENMT: Mucous membranes are moist. Posterior pharynx clear of any exudate or lesions.Normal dentition.  Neck: normal, supple, no masses, no thyromegaly Respiratory: clear to auscultation bilaterally, no wheezing, no crackles. Normal respiratory effort. No accessory muscle use.  Cardiovascular: Regular rate and rhythm, no murmurs / rubs / gallops. No extremity edema. 2+ pedal pulses. No carotid bruits.  Abdomen: no tenderness, no masses palpated. No hepatosplenomegaly. Bowel sounds positive.  Musculoskeletal: no clubbing / cyanosis. No joint deformity upper and lower extremities. Good ROM, no contractures. Normal muscle tone.  Skin: no rashes, lesions, ulcers.  Neurologic:  CN 2-12 grossly intact. Sensation intact, DTR normal. Strength 5/5 in all 4.  Psychiatric: Normal judgment and insight. Alert and oriented x 3. Normal mood.     Labs on Admission: I have personally reviewed following labs and imaging studies  CBC:  Recent Labs Lab 01/04/16 0535 01/05/16 0532 01/06/16 0552 01/10/16 1112  WBC 4.2 4.2 4.2 8.7  NEUTROABS 3.1 3.1 3.1 5.8  HGB 11.0* 11.3* 10.7* 9.1*  HCT 32.8* 32.6* 31.9* 27.3*  MCV 81.8 79.3 81.4 81.0  PLT 114* 134* 126* 87*    Basic Metabolic Panel:  Recent Labs Lab 01/04/16 0535 01/05/16 0532 01/06/16 0552 01/10/16 1112  NA 134* 133* 136 138  K 4.8 4.7 5.0 4.3  CL 104 103 107 113*  CO2 21* 22 21* 19*  GLUCOSE 200* 212* 194* 148*  BUN 33* 54* 54* 63*  CREATININE 1.24 1.36* 1.50* 2.20*  CALCIUM 9.2 9.8 9.0 8.3*  MG 1.7 1.9 1.8  --   PHOS 3.5 4.3 4.3  --     GFR: Estimated Creatinine Clearance: 26.4 mL/min (by C-G formula based on SCr of 2.2 mg/dL (H)).  Liver Function Tests:  Recent Labs Lab 01/04/16 0535 01/05/16 0532 01/06/16 0552 01/10/16 1112  AST 16 18 21  14*  ALT 14* 17 25 17   ALKPHOS 76 68 64 55  BILITOT 1.0 0.7 0.9 0.5  PROT 8.3* 8.1 7.5 5.9*  ALBUMIN 4.0 4.0 3.7 2.9*   No results for input(s): LIPASE, AMYLASE in the last 168 hours. No results for input(s): AMMONIA in the last 168 hours.  Coagulation Profile: No results for input(s): INR, PROTIME in the last 168 hours.  Cardiac Enzymes: No results for input(s): CKTOTAL, CKMB, CKMBINDEX, TROPONINI in the last 168 hours.  BNP (last 3 results) No results for input(s): PROBNP in the last 8760 hours.  HbA1C: No results for input(s): HGBA1C in the last 72 hours.  CBG:  Recent Labs Lab 01/05/16 1618 01/05/16  2054 01/06/16 0728 01/06/16 1121 01/06/16 1647  GLUCAP 192* 355* 169* 173* 191*    Lipid Profile: No results for input(s): CHOL, HDL, LDLCALC, TRIG, CHOLHDL, LDLDIRECT in the last 72 hours.  Thyroid Function Tests: No results  for input(s): TSH, T4TOTAL, FREET4, T3FREE, THYROIDAB in the last 72 hours.  Anemia Panel: No results for input(s): VITAMINB12, FOLATE, FERRITIN, TIBC, IRON, RETICCTPCT in the last 72 hours.  Urine analysis:    Component Value Date/Time   COLORURINE YELLOW 01/02/2016 [place] 01/02/2016 1235   LABSPEC 1.012 01/02/2016 1235   PHURINE 5.0 01/02/2016 1235   GLUCOSEU >=500 (A) 01/02/2016 1235   HGBUR SMALL (A) 01/02/2016 1235   BILIRUBINUR NEGATIVE 01/02/2016 1235   KETONESUR NEGATIVE 01/02/2016 1235   PROTEINUR 30 (A) 01/02/2016 1235   NITRITE NEGATIVE 01/02/2016 1235   LEUKOCYTESUR NEGATIVE 01/02/2016 1235    Sepsis Labs: @LABRCNTIP (procalcitonin:4,lacticidven:4) ) Recent Results (from the past 240 hour(s))  MRSA PCR Screening     Status: None   Collection Time: 01/02/16  6:31 AM  Result Value Ref Range Status   MRSA by PCR NEGATIVE NEGATIVE Final    Comment:        The GeneXpert MRSA Assay (FDA approved for NASAL specimens only), is one component of a comprehensive MRSA colonization surveillance program. It is not intended to diagnose MRSA infection nor to guide or monitor treatment for MRSA infections.   Urine culture     Status: Abnormal   Collection Time: 01/02/16 12:35 PM  Result Value Ref Range Status   Specimen Description URINE, RANDOM  Final   Special Requests NONE  Final   Culture (A)  Final    <10,000 COLONIES/mL INSIGNIFICANT GROWTH Performed at [facility]    Report Status 01/03/2016 FINAL  Final  Respiratory Panel by PCR     Status: Abnormal   Collection Time: 01/02/16  1:11 PM  Result Value Ref Range Status   Adenovirus NOT DETECTED NOT DETECTED Final   Coronavirus 229E NOT DETECTED NOT DETECTED Final   Coronavirus HKU1 NOT DETECTED NOT DETECTED Final   Coronavirus NL63 NOT DETECTED NOT DETECTED Final   Coronavirus OC43 NOT DETECTED NOT DETECTED Final   Metapneumovirus DETECTED (A) NOT DETECTED Final   Rhinovirus /  Enterovirus NOT DETECTED NOT DETECTED Final   Influenza A NOT DETECTED NOT DETECTED Final   Influenza B NOT DETECTED NOT DETECTED Final   Parainfluenza Virus 1 NOT DETECTED NOT DETECTED Final   Parainfluenza Virus 2 NOT DETECTED NOT DETECTED Final   Parainfluenza Virus 3 NOT DETECTED NOT DETECTED Final   Parainfluenza Virus 4 NOT DETECTED NOT DETECTED Final   Respiratory Syncytial Virus NOT DETECTED NOT DETECTED Final   Bordetella pertussis NOT DETECTED NOT DETECTED Final   Chlamydophila pneumoniae NOT DETECTED NOT DETECTED Final   Mycoplasma pneumoniae NOT DETECTED NOT DETECTED Final     Radiological Exams on Admission: No results found.  EKG: Independently reviewed.  Assessment/Plan Active Problems:   Dehydration   Weakness generalized   Acute renal failure (ARF) (HCC)   Type II diabetes mellitus with peripheral circulatory disorder (HCC)   Hypertension   COPD (chronic obstructive pulmonary disease) (HCC)   Malnutrition of moderate degree   CAD (coronary artery disease)   Hx of CABG   Chronic bilateral low back pain   Depression   Generalized weakness   Anemia   Presyncope  likely due to hypotension in the setting of dehydration and BP meds.  Septic shock doubted  BP improvement with 2.5 L NS (and appropriate urine output). Med effect suspected, the patient is currently taking 3 BP meds. Labs, EKG unrevealing. Tn negative . Afebrile.  Neuro exam unremarkable No seizure noted Syncope order set  -admit for observation - Tele bed. Orthostatics  -Noncontrast CT scan of head -2 D echo IV fluids Hold Clonidine, Coreg  and Zestril  Hold Zanaflex    CAD s/p CABG with perioperative CVA 2004  Stable, he is chest pain free  Continue ASA and statin. Coreg on hold   Type II Diabetes Current blood sugar level is 148 Lab Results  Component Value Date   HGBA1C 7.1 (H) 10/21/2015  Hold home oral diabetic medications.  SSI Heart healthy carb modified diet.  COPD without  exacerbation [place]  CXR and WBC  Negative for acute findings  Continue  O2 prn and supportive measures with nebs if needed    Anemia of chronic disease and dilution  Hemoglobin on admission 9.1  . 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 S/p BM transplant in [place] at [place] To follow as OP at [place]   History of seizures, was on Keppra, but none since last admission  Replace Keppra daily    DVT prophylaxis:   SCDs due to thrombocytopenia  Code Status:   Full   Family Communication:  Discussed with patient Disposition Plan: Expect patient to be discharged to shelter  after condition improves Consults called:    None Admission status:Tele  Obs      Thane Age E, PA-C [facility]   01/10/2016, 2:44 PM

## 2016-01-10 NOTE — ED Triage Notes (Signed)
Pt here from a halfway house with c/o orthostatic hypotension pt was just d/c'd from the hospital 4 days ago with same dx,

## 2016-01-10 NOTE — ED Provider Notes (Signed)
[facility] DEPT Provider Note   CSN: [id number] Arrival date & time: 01/10/16  1031     History   Chief Complaint Chief Complaint  Patient presents with  . Hypotension    HPI Douglas Mccoy is a 70 y.o. male. He presents via EMS dizzy, near syncopal, and hypotensive. History of hypertension. Somewhat labile. Occasionally has high spikes. Just discharged 4 days ago after medication adjustment and inpatient care for hypotension. He was discharged to a "halfway house". He was symptomatically one of lightheadedness. He took his own blood pressure was 947 systolic. He took his blood pressure medication. Became worse. Pressures down into the 70s and lightheadedness or syncopal transferred via EMS. Given 700 mL normal saline in route. Arrives with pressure 78 systolic.  HPI  Past Medical History:  Diagnosis Date  . CAD (coronary artery disease)   . COPD (chronic obstructive pulmonary disease) ([place])   . Depression   . Diabetes mellitus without complication ([place])   . GERD (gastroesophageal reflux disease)   . History of stem cell transplant ([place])   . Hyperlipidemia   . Hypertension   . Hypothyroidism   . Multiple myeloma ([place])   . Multiple myeloma ([place])   . Seizures ([place])   . Shingles   . Stroke [facility])     Patient Active Problem List   Diagnosis Date Noted  . Pre-syncope 01/10/2016  . Generalized weakness 01/01/2016  . Dehydration 01/01/2016  . Weakness generalized 01/01/2016  . Anemia   . Essential hypertension, malignant 10/25/2015  . CAD (coronary artery disease) 10/25/2015  . Hx of CABG 10/25/2015  . Metabolic acidosis 65/46/5035  . Chronic bilateral low back pain 10/25/2015  . Depression 10/25/2015  . Late effect of cerebrovascular accident (CVA) 10/25/2015  . Malnutrition of moderate degree 10/22/2015  . Acute renal failure (ARF) ([place]) 10/20/2015  . Hyperkalemia 10/20/2015  . Type II diabetes mellitus with peripheral circulatory disorder ([place]) 10/20/2015  .  Hypertension 10/20/2015  . COPD (chronic obstructive pulmonary disease) ([place]) 10/20/2015  . Multiple myeloma ([place]) 10/20/2015    Past Surgical History:  Procedure Laterality Date  . BACK SURGERY     fracture  . BYPASS GRAFT     x4  . CAROTID ARTERY ANGIOPLASTY     Right carotid artery "cleaned out"  . CHOLECYSTECTOMY         Home Medications    Prior to Admission medications   Medication Sig Start Date End Date Taking? Authorizing Provider  acyclovir (ZOVIRAX) 200 MG capsule Take 200 mg by mouth 2 (two) times daily.   Yes Historical Provider, MD  albuterol (PROVENTIL HFA;VENTOLIN HFA) 108 (90 Base) MCG/ACT inhaler Inhale 1-2 puffs into the lungs every 8 (eight) hours as needed for wheezing or shortness of breath.   Yes Historical Provider, MD  aspirin 325 MG tablet Take 325 mg by mouth daily.   Yes Historical Provider, MD  cloNIDine (CATAPRES) 0.1 MG tablet Take 1 tablet (0.1 mg total) by mouth at bedtime. 10/23/15  Yes Florencia Reasons, MD  gabapentin (NEURONTIN) 300 MG capsule Take 600 mg by mouth 2 (two) times daily.    Yes Historical Provider, MD  guaiFENesin (MUCINEX) 600 MG 12 hr tablet Take 1 tablet (600 mg total) by mouth 2 (two) times daily. Patient taking differently: Take 600 mg by mouth every 12 (twelve) hours as needed for cough.  01/06/16  Yes [address], DO  guaiFENesin-dextromethorphan (ROBITUSSIN DM) 100-10 MG/5ML syrup Take 5 mLs by mouth every 4 (four) hours as  needed for cough. 01/06/16  Yes [place], DO  insulin aspart (NOVOLOG) 100 UNIT/ML injection Inject into the skin. Inject as per sliding scale: if  0-139= 0 ; 140-199 = 2 units, 200-250= 4 units; 251-299= 6 units; 300-349= 8 units; 350-450= 10 units, subcutaneously before meals related to DM. Call MD for CBG less than 60 or greater than 450   Yes Historical Provider, MD  levothyroxine (SYNTHROID) 50 MCG tablet Take 1 tablet (50 mcg total) by mouth daily before breakfast. 10/23/15  Yes Florencia Reasons, MD    lisinopril (PRINIVIL,ZESTRIL) 20 MG tablet Take 20 mg by mouth daily.   Yes Historical Provider, MD  Magnesium Oxide 420 MG TABS Take 840 mg by mouth 2 (two) times daily.   Yes Historical Provider, MD  sertraline (ZOLOFT) 50 MG tablet Take 50 mg by mouth daily.   Yes Historical Provider, MD  simvastatin (ZOCOR) 10 MG tablet Take 10 mg by mouth at bedtime.   Yes Historical Provider, MD  sodium bicarbonate 650 MG tablet Take 1 tablet (650 mg total) by mouth daily. 10/24/15  Yes Florencia Reasons, MD  tiZANidine (ZANAFLEX) 4 MG tablet Take 4 mg by mouth every 8 (eight) hours as needed for muscle spasms.   Yes Historical Provider, MD  benzonatate (TESSALON) 100 MG capsule Take 1 capsule (100 mg total) by mouth 3 (three) times daily as needed for cough. 01/06/16   [place], DO  carvedilol (COREG) 12.5 MG tablet Take 1 tablet (12.5 mg total) by mouth 2 (two) times daily with a meal. Patient not taking: Reported on 01/10/2016 10/23/15   Florencia Reasons, MD  doxycycline (VIBRA-TABS) 100 MG tablet Take 1 tablet (100 mg total) by mouth every 12 (twelve) hours. 01/06/16   [place], DO  famotidine (PEPCID) 20 MG tablet Take 1 tablet (20 mg total) by mouth daily. Patient not taking: Reported on 01/10/2016 10/23/15   Florencia Reasons, MD  fluticasone [facility]) 50 MCG/ACT nasal spray Place 2 sprays into both nostrils daily. 01/07/16   Kerney Elbe, DO  loperamide (IMODIUM) 2 MG capsule Take 1 capsule (2 mg total) by mouth 3 (three) times daily as needed for diarrhea or loose stools. Patient not taking: Reported on 01/10/2016 10/23/15   Florencia Reasons, MD    Family History Family History  Problem Relation Age of Onset  . Diabetes Mother   . Cancer Neg Hx   . Stroke Neg Hx     Social History Social History  Substance Use Topics  . Smoking status: Former Research scientist (life sciences)  . Smokeless tobacco: Never Used  . Alcohol use No     Allergies   Amoxapine and related; Amoxicillin; and Augmentin [amoxicillin-pot  clavulanate]   Review of Systems Review of Systems  Constitutional: Negative for appetite change, chills, diaphoresis, fatigue and fever.  HENT: Negative for mouth sores, sore throat and trouble swallowing.   Eyes: Negative for visual disturbance.  Respiratory: Negative for cough, chest tightness, shortness of breath and wheezing.   Cardiovascular: Negative for chest pain.  Gastrointestinal: Negative for abdominal distention, abdominal pain, diarrhea, nausea and vomiting.  Endocrine: Negative for polydipsia, polyphagia and polyuria.  Genitourinary: Negative for dysuria, frequency and hematuria.  Musculoskeletal: Negative for gait problem.  Skin: Negative for color change, pallor and rash.  Neurological: Positive for dizziness, syncope, weakness and light-headedness. Negative for headaches.  Hematological: Does not bruise/bleed easily.  Psychiatric/Behavioral: Negative for behavioral problems and confusion.     Physical Exam Updated Vital Signs BP 125/60  Pulse (!) 54   Temp 98 F (36.7 C) (Oral)   Resp 15   Ht 5' 5"  (1.651 m)   Wt 143 lb (64.9 kg)   SpO2 95%   BMI 23.80 kg/m   Physical Exam  Constitutional: He is oriented to person, place, and time. He appears well-developed and well-nourished. No distress.  Awake and alert. Mentating well.  HENT:  Head: Normocephalic.  Eyes: Conjunctivae are normal. Pupils are equal, round, and reactive to light. No scleral icterus.  Neck: Normal range of motion. Neck supple. No thyromegaly present.  Cardiovascular: Normal rate and regular rhythm.  Exam reveals no gallop and no friction rub.   No murmur heard. Pulmonary/Chest: Effort normal and breath sounds normal. No respiratory distress. He has no wheezes. He has no rales.  Abdominal: Soft. Bowel sounds are normal. He exhibits no distension. There is no tenderness. There is no rebound.  Musculoskeletal: Normal range of motion.  Neurological: He is alert and oriented to person,  place, and time.  Skin: Skin is warm and dry. No rash noted.  Psychiatric: He has a normal mood and affect. His behavior is normal.     ED Treatments / Results  Labs (all labs ordered are listed, but only abnormal results are displayed) Labs Reviewed  CBC WITH DIFFERENTIAL/PLATELET - Abnormal; Notable for the following:       Result Value   RBC 3.37 (*)    Hemoglobin 9.1 (*)    HCT 27.3 (*)    RDW 16.2 (*)    Platelets 87 (*)    All other components within normal limits  COMPREHENSIVE METABOLIC PANEL - Abnormal; Notable for the following:    Chloride 113 (*)    CO2 19 (*)    Glucose, Bld 148 (*)    BUN 63 (*)    Creatinine, Ser 2.20 (*)    Calcium 8.3 (*)    Total Protein 5.9 (*)    Albumin 2.9 (*)    AST 14 (*)    GFR calc non Af Amer 28 (*)    GFR calc Af Amer 33 (*)    All other components within normal limits  TROPONIN I  TROPONIN I  TROPONIN I  URINALYSIS, ROUTINE W REFLEX MICROSCOPIC  I-STAT CG4 LACTIC ACID, ED  I-STAT TROPOININ, ED    EKG  EKG Interpretation None       Radiology No results found.  Procedures Procedures (including critical care time)  Medications Ordered in ED Medications  levothyroxine (SYNTHROID, LEVOTHROID) tablet 50 mcg (not administered)  loperamide (IMODIUM) capsule 2 mg (not administered)  aspirin tablet 325 mg (not administered)  lisinopril (PRINIVIL,ZESTRIL) tablet 20 mg (not administered)  benzonatate (TESSALON) capsule 100 mg (not administered)  guaiFENesin (MUCINEX) 12 hr tablet 600 mg (not administered)  gabapentin (NEURONTIN) capsule 600 mg (not administered)  acyclovir (ZOVIRAX) 200 MG capsule 200 mg (not administered)  cloNIDine (CATAPRES) tablet 0.1 mg (not administered)  sertraline (ZOLOFT) tablet 50 mg (not administered)  simvastatin (ZOCOR) tablet 10 mg (not administered)  sodium chloride flush (NS) 0.9 % injection 3 mL (not administered)  0.9 %  sodium chloride infusion (not administered)  acetaminophen  (TYLENOL) tablet 650 mg (not administered)    Or  acetaminophen (TYLENOL) suppository 650 mg (not administered)  insulin aspart (novoLOG) injection 0-9 Units (not administered)  sodium chloride 0.9 % bolus 1,000 mL (0 mLs Intravenous Stopped 01/10/16 1200)  sodium chloride 0.9 % bolus 1,000 mL (1,000 mLs Intravenous New Bag/Given 01/10/16 1116)  Initial Impression / Assessment and Plan / ED Course  I have reviewed the triage vital signs and the nursing notes.  Pertinent labs & imaging results that were available during my care of the patient were reviewed by me and considered in my medical decision making (see chart for details).  Clinical Course     The total 2700 mL IV fluid to reach a systolic pressure of 89-791 and has urinated. Has a KI with creatinine of 2.2. Discussed with hospitalist.  Will admit.  Final Clinical Impressions(s) / ED Diagnoses   Final diagnoses:  Hypotension, unspecified hypotension type    New Prescriptions New Prescriptions   No medications on file     Tanna Furry, MD 01/10/16 1506

## 2016-01-10 NOTE — ED Notes (Signed)
attempted reports

## 2016-01-10 NOTE — ED Notes (Signed)
Patient undressed, in gown, on monitor, continuous pulse oximetry and blood pressure cuff; orthostatic lying and sitting performed; [place], RN present when performed; Jeneen Rinks, MD notified of vital signs

## 2016-01-10 NOTE — Progress Notes (Signed)
Patient lying in bed, no needs at this time. Bed alarm on, call light within reach 

## 2016-01-10 NOTE — ED Notes (Signed)
Patient transported to CT 

## 2016-01-11 ENCOUNTER — Observation Stay (HOSPITAL_BASED_OUTPATIENT_CLINIC_OR_DEPARTMENT_OTHER): Payer: Medicare Other

## 2016-01-11 ENCOUNTER — Observation Stay (HOSPITAL_COMMUNITY): Payer: Medicare Other

## 2016-01-11 DIAGNOSIS — E860 Dehydration: Secondary | ICD-10-CM | POA: Diagnosis present

## 2016-01-11 DIAGNOSIS — R55 Syncope and collapse: Secondary | ICD-10-CM

## 2016-01-11 DIAGNOSIS — I2510 Atherosclerotic heart disease of native coronary artery without angina pectoris: Secondary | ICD-10-CM | POA: Diagnosis present

## 2016-01-11 DIAGNOSIS — F329 Major depressive disorder, single episode, unspecified: Secondary | ICD-10-CM | POA: Diagnosis present

## 2016-01-11 DIAGNOSIS — B029 Zoster without complications: Secondary | ICD-10-CM | POA: Diagnosis present

## 2016-01-11 DIAGNOSIS — E440 Moderate protein-calorie malnutrition: Secondary | ICD-10-CM | POA: Diagnosis present

## 2016-01-11 DIAGNOSIS — K529 Noninfective gastroenteritis and colitis, unspecified: Secondary | ICD-10-CM | POA: Diagnosis present

## 2016-01-11 DIAGNOSIS — N179 Acute kidney failure, unspecified: Secondary | ICD-10-CM | POA: Diagnosis present

## 2016-01-11 DIAGNOSIS — C9001 Multiple myeloma in remission: Secondary | ICD-10-CM | POA: Diagnosis present

## 2016-01-11 DIAGNOSIS — Z91128 Patient's intentional underdosing of medication regimen for other reason: Secondary | ICD-10-CM | POA: Diagnosis not present

## 2016-01-11 DIAGNOSIS — J440 Chronic obstructive pulmonary disease with acute lower respiratory infection: Secondary | ICD-10-CM | POA: Diagnosis present

## 2016-01-11 DIAGNOSIS — E785 Hyperlipidemia, unspecified: Secondary | ICD-10-CM | POA: Diagnosis present

## 2016-01-11 DIAGNOSIS — E1151 Type 2 diabetes mellitus with diabetic peripheral angiopathy without gangrene: Secondary | ICD-10-CM | POA: Diagnosis present

## 2016-01-11 DIAGNOSIS — I10 Essential (primary) hypertension: Secondary | ICD-10-CM | POA: Diagnosis present

## 2016-01-11 DIAGNOSIS — Z9484 Stem cells transplant status: Secondary | ICD-10-CM | POA: Diagnosis not present

## 2016-01-11 DIAGNOSIS — Z87891 Personal history of nicotine dependence: Secondary | ICD-10-CM | POA: Diagnosis not present

## 2016-01-11 DIAGNOSIS — J189 Pneumonia, unspecified organism: Secondary | ICD-10-CM | POA: Diagnosis present

## 2016-01-11 DIAGNOSIS — Z79899 Other long term (current) drug therapy: Secondary | ICD-10-CM | POA: Diagnosis not present

## 2016-01-11 DIAGNOSIS — Y92009 Unspecified place in unspecified non-institutional (private) residence as the place of occurrence of the external cause: Secondary | ICD-10-CM | POA: Diagnosis not present

## 2016-01-11 DIAGNOSIS — D638 Anemia in other chronic diseases classified elsewhere: Secondary | ICD-10-CM | POA: Diagnosis present

## 2016-01-11 DIAGNOSIS — T483X6A Underdosing of antitussives, initial encounter: Secondary | ICD-10-CM | POA: Diagnosis present

## 2016-01-11 DIAGNOSIS — E039 Hypothyroidism, unspecified: Secondary | ICD-10-CM | POA: Diagnosis present

## 2016-01-11 DIAGNOSIS — G40909 Epilepsy, unspecified, not intractable, without status epilepticus: Secondary | ICD-10-CM | POA: Diagnosis present

## 2016-01-11 DIAGNOSIS — I959 Hypotension, unspecified: Secondary | ICD-10-CM | POA: Diagnosis present

## 2016-01-11 DIAGNOSIS — E872 Acidosis: Secondary | ICD-10-CM | POA: Diagnosis present

## 2016-01-11 DIAGNOSIS — T364X6A Underdosing of tetracyclines, initial encounter: Secondary | ICD-10-CM | POA: Diagnosis present

## 2016-01-11 LAB — COMPREHENSIVE METABOLIC PANEL
ALK PHOS: 59 U/L (ref 38–126)
ALT: 16 U/L — AB (ref 17–63)
AST: 15 U/L (ref 15–41)
Albumin: 3 g/dL — ABNORMAL LOW (ref 3.5–5.0)
Anion gap: 6 (ref 5–15)
BUN: 40 mg/dL — AB (ref 6–20)
CHLORIDE: 112 mmol/L — AB (ref 101–111)
CO2: 17 mmol/L — AB (ref 22–32)
CREATININE: 1.29 mg/dL — AB (ref 0.61–1.24)
Calcium: 8.2 mg/dL — ABNORMAL LOW (ref 8.9–10.3)
GFR calc Af Amer: >60 mL/min (ref >60)
GFR, EST NON AFRICAN AMERICAN: 54 mL/min — AB (ref >60)
Glucose, Bld: 225 mg/dL — ABNORMAL HIGH (ref 65–99)
Potassium: 4.4 mmol/L (ref 3.5–5.1)
Sodium: 135 mmol/L (ref 135–145)
Total Bilirubin: 0.2 mg/dL — ABNORMAL LOW (ref 0.3–1.2)
Total Protein: 6.3 g/dL — ABNORMAL LOW (ref 6.5–8.1)

## 2016-01-11 LAB — CBC
HCT: 29.3 % — ABNORMAL LOW (ref 39.0–52.0)
Hemoglobin: 9.6 g/dL — ABNORMAL LOW (ref 13.0–17.0)
MCH: 27.4 pg (ref 26.0–34.0)
MCHC: 32.8 g/dL (ref 30.0–36.0)
MCV: 83.5 fL (ref 78.0–100.0)
Platelets: 79 10*3/uL — ABNORMAL LOW (ref 150–400)
RBC: 3.51 MIL/uL — ABNORMAL LOW (ref 4.22–5.81)
RDW: 16.7 % — ABNORMAL HIGH (ref 11.5–15.5)
WBC: 6.7 10*3/uL (ref 4.0–10.5)

## 2016-01-11 LAB — ECHOCARDIOGRAM COMPLETE
Height: 65 in
Weight: 2377.6 oz

## 2016-01-11 LAB — GLUCOSE, CAPILLARY
GLUCOSE-CAPILLARY: 182 mg/dL — AB (ref 65–99)
Glucose-Capillary: 185 mg/dL — ABNORMAL HIGH (ref 65–99)
Glucose-Capillary: 165 mg/dL — ABNORMAL HIGH (ref 65–99)
Glucose-Capillary: 183 mg/dL — ABNORMAL HIGH (ref 65–99)

## 2016-01-11 MED ORDER — TIZANIDINE HCL 4 MG PO TABS
4.0000 mg | ORAL_TABLET | Freq: Three times a day (TID) | ORAL | Status: DC | PRN
  Administered 2016-01-11: 4 mg via ORAL
  Filled 2016-01-11 (×3): qty 1

## 2016-01-11 MED ORDER — DOXYCYCLINE HYCLATE 100 MG PO TABS
100.0000 mg | ORAL_TABLET | Freq: Two times a day (BID) | ORAL | Status: DC
  Administered 2016-01-11 – 2016-01-12 (×4): 100 mg via ORAL
  Filled 2016-01-11 (×4): qty 1

## 2016-01-11 MED ORDER — OXYCODONE-ACETAMINOPHEN 5-325 MG PO TABS
1.0000 | ORAL_TABLET | ORAL | Status: DC | PRN
  Administered 2016-01-11 – 2016-01-12 (×3): 1 via ORAL
  Filled 2016-01-11 (×3): qty 1

## 2016-01-11 MED ORDER — GUAIFENESIN ER 600 MG PO TB12
1200.0000 mg | ORAL_TABLET | Freq: Two times a day (BID) | ORAL | Status: DC
  Administered 2016-01-11 – 2016-01-12 (×4): 1200 mg via ORAL
  Filled 2016-01-11 (×4): qty 2

## 2016-01-11 MED ORDER — SODIUM BICARBONATE 650 MG PO TABS
650.0000 mg | ORAL_TABLET | Freq: Every day | ORAL | Status: DC
  Administered 2016-01-11 – 2016-01-12 (×2): 650 mg via ORAL
  Filled 2016-01-11 (×2): qty 1

## 2016-01-11 MED ORDER — FAMOTIDINE 20 MG PO TABS
20.0000 mg | ORAL_TABLET | Freq: Every day | ORAL | Status: DC
  Administered 2016-01-11 – 2016-01-12 (×2): 20 mg via ORAL
  Filled 2016-01-11 (×2): qty 1

## 2016-01-11 MED ORDER — OXYCODONE-ACETAMINOPHEN 5-325 MG PO TABS: 1.0000 | ORAL_TABLET | Freq: Once | ORAL | Status: DC

## 2016-01-11 NOTE — Progress Notes (Signed)
PROGRESS NOTE    Douglas Mccoy  RCV:[id number] DOB: October 03, 1945 DOA: 01/10/2016 PCP: [facility]    Brief Narrative:  70 y/o ? [place] resident Copd DM ty II Multiple myeloma in remission-  with chronic anemia and TCP  S/p BM transplant [place] at [place]  Prior CVA x2  Seizure disorder on keppra Bipolar Chr Diarrhea Hypothyroid Current [place] to [place] CAd s/p CABG -peri-op CVA 2004  Recent admission to [facility] 12/23 with [place]  Re-admitted 12/28 with hypotension, syncope and fall Orthostatics noted + CT head neg BUn Creatinine on admit 63/2.2 [basline creatinine anywhere from 2-2.5]   Assessment & Plan:   Active Problems:   Acute renal failure (ARF) (HCC)   Type II diabetes mellitus with peripheral circulatory disorder (HCC)   Hypertension   COPD (chronic obstructive pulmonary disease) (HCC)   Malnutrition of moderate degree   CAD (coronary artery disease)   Hx of CABG   Chronic bilateral low back pain   Depression   Generalized weakness   Dehydration   Weakness generalized   Anemia   Pre-syncope   AKI (acute kidney injury) ([place])   Hypotension   Near syncope   Possible pneumonia, recently treated for medical virus-producing sputum and having cough and wheeze. We'll repeat 2 view chest x-ray 12/28.  Start Mucinex, continue Tessalon per dose he did not pick up any of the medications on discharge from hospital recently I have restarted doxycycline which she was supposed to take on discharge from last hospitalization and will follow for chest x-ray Patient to the education in use of flutter valve  Hypotension and syncope continue clonidine 0.1 mg and keep  lisinopril 20 We will reassess that pressure trends going forward Patient was orthostatic as of yesterday 12/27 on admission but does not remain so today and we will reassess  Acute kidney injury Admission BUN/creatinine 63/2.2 down to 40/1 2 Cut back IV saline from 100 cc per hour to 50  cc per hour Okay to continue sodium bicarbonate 650 daily as has a chronic metabolic acidosis  Back pain and neck pain Is on tizanidine which was restarted 4 mg every 8 when necessary will give empiric pain control with OxyIR as ibuprofen and Tylenol do not help-  Multiple myeloma in remission status post bone marrow transplant at [place] to the chest Continue Zovirax 200 twice a day as well as Neurontin 100 twice a day  Chronic diarrhea Needs outpatient workup-has been having this for 8 years Continue Imodium 2 mg 3 times a day Would not prescribe narcotics as an outpatient  Hyperlipidemia continue Zocor 10 mg daily at bedtime   DVT prophylaxis: scd Code Status: Full Family Communication: No family present at the bedside Disposition Plan: To return to shelter when able   Consultants:   None  Procedures:   None  Antimicrobials:   Zovirax chronic   Doxycycline 12/28   Subjective: Coughing and wheezing Not able to produce sputum Does not feel good Chest discomfort across back and neck  Objective: Vitals:   01/10/16 1745 01/10/16 1947 01/11/16 0004 01/11/16 0400  BP:  (!) 146/55 (!) 172/72 (!) 144/63  Pulse:  62 67 71  Resp:  _0 Temp:  97.5 F (36.4 C) 98.4 F (36.9 C) 98.3 F (36.8 C)  TempSrc:  Oral Oral Oral  SpO2: 98% 99% 97% 96%  Weight:    67.4 kg (148 lb 9.6 oz)  Height:  Intake/Output Summary (Last 24 hours) at 01/11/16 0741 Last data filed at 01/11/16 0300  Gross per 24 hour  Intake             4755 ml  Output              500 ml  Net             4255 ml   Filed Weights   01/10/16 1038 01/11/16 0400  Weight: 64.9 kg (143 lb) 67.4 kg (148 lb 9.6 oz)    Examination:  General exam: Appears calmSome tightness to the muscles of the upper neck and back  Respiratory system: Lower lung field crackles Cardiovascular system: S1 & S2 heard, RRR. No JVD, murmurs, rubs, gallops or clicks. No pedal  edema. Gastrointestinal system: Abdomen is nondistended, soft and nontender. No organomegaly or masses felt. Normal bowel sounds heard. Central nervous system: Alert and oriented. No focal neurological deficits. Extremities: Symmetric 5 x 5 power. Skin: No rashes, lesions or ulcers Psychiatry: Judgement and insight appear normal. Mood & affect appropriate.     Data Reviewed: I have personally reviewed following labs and imaging studies  CBC:  Recent Labs Lab 01/05/16 0532 01/06/16 0552 01/10/16 1112 01/11/16 0215  WBC 4.2 4.2 8.7 6.7  NEUTROABS 3.1 3.1 5.8  --   HGB 11.3* 10.7* 9.1* 9.6*  HCT 32.6* 31.9* 27.3* 29.3*  MCV 79.3 81.4 81.0 83.5  PLT 134* 126* 87* 79*   Basic Metabolic Panel:  Recent Labs Lab 01/05/16 0532 01/06/16 0552 01/10/16 1112 01/11/16 0215  NA 133* 136 138 135  K 4.7 5.0 4.3 4.4  CL 103 107 113* 112*  CO2 22 21* 19* 17*  GLUCOSE 212* 194* 148* 225*  BUN 54* 54* 63* 40*  CREATININE 1.36* 1.50* 2.20* 1.29*  CALCIUM 9.8 9.0 8.3* 8.2*  MG 1.9 1.8  --   --   PHOS 4.3 4.3  --   --    GFR: Estimated Creatinine Clearance: 45 mL/min (by C-G formula based on SCr of 1.29 mg/dL (H)). Liver Function Tests:  Recent Labs Lab 01/05/16 0532 01/06/16 0552 01/10/16 1112 01/11/16 0215  AST 18 21 14* 15  ALT _0 16*  ALKPHOS 68 64 55 59  BILITOT 0.7 0.9 0.5 0.2*  PROT 8.1 7.5 5.9* 6.3*  ALBUMIN 4.0 3.7 2.9* 3.0*   No results for input(s): LIPASE, AMYLASE in the last 168 hours. No results for input(s): AMMONIA in the last 168 hours. Coagulation Profile: No results for input(s): INR, PROTIME in the last 168 hours. Cardiac Enzymes:  Recent Labs Lab 01/10/16 1513  TROPONINI <0.03   BNP (last 3 results) No results for input(s): PROBNP in the last 8760 hours. HbA1C: No results for input(s): HGBA1C in the last 72 hours. CBG:  Recent Labs Lab 01/06/16 1121 01/06/16 1647 01/10/16 1745 01/10/16 2121 01/11/16 0618  GLUCAP 173* 191* 125*  194* 182*   Lipid Profile: No results for input(s): CHOL, HDL, LDLCALC, TRIG, CHOLHDL, LDLDIRECT in the last 72 hours. Thyroid Function Tests: No results for input(s): TSH, T4TOTAL, FREET4, T3FREE, THYROIDAB in the last 72 hours. Anemia Panel: No results for input(s): VITAMINB12, FOLATE, FERRITIN, TIBC, IRON, RETICCTPCT in the last 72 hours. Sepsis Labs:  Recent Labs Lab 01/10/16 1129  LATICACIDVEN 1.85    Recent Results (from the past 240 hour(s))  MRSA PCR Screening     Status: None   Collection Time: 01/02/16  6:31 AM  Result Value Ref Range Status  MRSA by PCR NEGATIVE NEGATIVE Final    Comment:        The GeneXpert MRSA Assay (FDA approved for NASAL specimens only), is one component of a comprehensive MRSA colonization surveillance program. It is not intended to diagnose MRSA infection nor to guide or monitor treatment for MRSA infections.   Urine culture     Status: Abnormal   Collection Time: 01/02/16 12:35 PM  Result Value Ref Range Status   Specimen Description URINE, RANDOM  Final   Special Requests NONE  Final   Culture (A)  Final    <10,000 COLONIES/mL INSIGNIFICANT GROWTH Performed at [facility]    Report Status 01/03/2016 FINAL  Final  Respiratory Panel by PCR     Status: Abnormal   Collection Time: 01/02/16  1:11 PM  Result Value Ref Range Status   Adenovirus NOT DETECTED NOT DETECTED Final   Coronavirus 229E NOT DETECTED NOT DETECTED Final   Coronavirus HKU1 NOT DETECTED NOT DETECTED Final   Coronavirus NL63 NOT DETECTED NOT DETECTED Final   Coronavirus OC43 NOT DETECTED NOT DETECTED Final   Metapneumovirus DETECTED (A) NOT DETECTED Final   Rhinovirus / Enterovirus NOT DETECTED NOT DETECTED Final   Influenza A NOT DETECTED NOT DETECTED Final   Influenza B NOT DETECTED NOT DETECTED Final   Parainfluenza Virus 1 NOT DETECTED NOT DETECTED Final   Parainfluenza Virus 2 NOT DETECTED NOT DETECTED Final   Parainfluenza Virus 3 NOT DETECTED  NOT DETECTED Final   Parainfluenza Virus 4 NOT DETECTED NOT DETECTED Final   Respiratory Syncytial Virus NOT DETECTED NOT DETECTED Final   Bordetella pertussis NOT DETECTED NOT DETECTED Final   Chlamydophila pneumoniae NOT DETECTED NOT DETECTED Final   Mycoplasma pneumoniae NOT DETECTED NOT DETECTED Final         Radiology Studies: Ct Head Wo Contrast  Result Date: 01/10/2016 CLINICAL DATA:  Pain after fall.  Syncope. EXAM: CT HEAD WITHOUT CONTRAST TECHNIQUE: Contiguous axial images were obtained from the base of the skull through the vertex without intravenous contrast. COMPARISON:  None. FINDINGS: Brain: No subdural, epidural, or subarachnoid hemorrhage. Scattered white matter changes with a right corona radiata lacunar infarct on image 20. A lacunar infarct is seen in the left thalamus as well. No acute cortical ischemia. Cerebellum, brainstem, and basal cisterns are normal. No mass, mass effect, or midline shift. Ventricles and sulci are unremarkable. Vascular: Calcified atherosclerosis seen in the intracranial carotid arteries. Skull: Normal. Negative for fracture or focal lesion. Sinuses/Orbits: Mucosal thickening is seen in the maxillary and ethmoid sinuses with a mucus or fluid seen in the left maxillary sinus. Mastoid air cells and middle ears are well aerated. Other: None. IMPRESSION: 1. No acute intracranial abnormality identified. White matter changes including a few scattered lacunar infarcts. No acute cortical ischemia or bleed identified. Electronically Signed   By: Dorise Bullion III M.D   On: 01/10/2016 15:59        Scheduled Meds: . acyclovir  200 mg Oral BID  . aspirin  325 mg Oral Daily  . cloNIDine  0.1 mg Oral QHS  . gabapentin  600 mg Oral BID  . insulin aspart  0-9 Units Subcutaneous TID WC  . lactobacillus acidophilus  1 tablet Oral BID  . levETIRAcetam  500 mg Oral Daily  . levothyroxine  50 mcg Oral QAC breakfast  . lisinopril  20 mg Oral Daily  .  sertraline  50 mg Oral Daily  . simvastatin  10 mg Oral QHS  .  sodium chloride flush  3 mL Intravenous Q12H   Continuous Infusions: . sodium chloride 100 mL/hr at 01/11/16 0224     LOS: 0 days    Time spent: [place], [place], MD [facility] Pager [phone]  If 7PM-7AM, please contact night-coverage [email or web address] Password [facility] 01/11/2016, 7:41 AM

## 2016-01-11 NOTE — Care Management Obs Status (Signed)
[place] NOTIFICATION   Patient Details  Name: Douglas Mccoy MRN: [id number] Date of Birth: 13-Jul-1945   Medicare Observation Status Notification Given:  Yes    Erenest Rasher, RN 01/11/2016, 5:04 PM

## 2016-01-11 NOTE — Progress Notes (Signed)
  Echocardiogram 2D Echocardiogram has been performed.  Douglas Mccoy 01/11/2016, 12:37 PM

## 2016-01-11 NOTE — Progress Notes (Signed)
Patient lying in bed, no needs at this time call light within reach 

## 2016-01-11 NOTE — Progress Notes (Signed)
Patient told NT that he is not supposed to get blood pressures in his left arm because of a prior shoulder operation. He states that the muscle, ligaments and nerves were taken out in Aug of 2000. Applied a restricted extremity band on.

## 2016-01-11 NOTE — Evaluation (Signed)
Occupational Therapy Evaluation and Discharge Patient Details Name: Joshus Rogan MRN: [id number] DOB: [birth year] Today's Date: 01/11/2016    History of Present Illness 70 y.o. male with medical history significant for Multiple Myeloma on remission  s/p stem cell transplant, hypothyroidism, CVA, CAD, COPD, Hypertension, diabetes,recent history of  Viral URI with Metapneurovirus treated supportively , recently admitted for hypotension and  generalized weakness, improved with IV hydration  And adjustment of his BP meds. He returns with similar symptoms today. He reports feeling dizzy and presyncopal.   Clinical Impression   Pt reports he was independent with ADL PTA. Currently pt overall supervision to min guard for ADL and functional mobility. No c/o of dizziness this session and VSS throughout. Pt planning on returning to group home upon d/c. No further acute OT needs identified; signing off at this time. Please re-consult if needs change. Thank you for this referral.    Follow Up Recommendations  No OT follow up;Supervision - Intermittent    Equipment Recommendations  None recommended by OT    Recommendations for Other Services       Precautions / Restrictions Precautions Precautions: Fall Restrictions Weight Bearing Restrictions: No      Mobility Bed Mobility Overal bed mobility: Modified Independent                Transfers Overall transfer level: Needs assistance Equipment used: Rolling walker (2 wheeled) Transfers: Sit to/from Stand Sit to Stand: Min guard         General transfer comment: Min guard for safety. No unsteadiness or dizziness.    Balance Overall balance assessment: No apparent balance deficits (not formally assessed)                                          ADL Overall ADL's : Needs assistance/impaired Eating/Feeding: Independent;Sitting   Grooming: Supervision/safety;Wash/dry hands;Oral care;Standing   Upper Body  Bathing: Set up;Sitting   Lower Body Bathing: Min guard;Sit to/from stand   Upper Body Dressing : Set up;Sitting Upper Body Dressing Details (indicate cue type and reason): to don hospital gown Lower Body Dressing: Min guard;Sit to/from stand Lower Body Dressing Details (indicate cue type and reason): pt able to adjust socks sitting EOB Toilet Transfer: Min guard;Ambulation;Regular Toilet;RW Toilet Transfer Details (indicate cue type and reason): Simulated by sit to stand from EOB with functional mobility in room. Pt able to stand at toilet to void with supervision         Functional mobility during ADLs: Min guard;Rolling walker General ADL Comments: No c/o of dizziness throughout session. VSS.     Vision Vision Assessment?: No apparent visual deficits   Perception     Praxis      Pertinent Vitals/Pain Pain Assessment: Faces Faces Pain Scale: Hurts little more Pain Location: bil shoulders, neck Pain Descriptors / Indicators: Sore Pain Intervention(s): Monitored during session     Hand Dominance     Extremity/Trunk Assessment Upper Extremity Assessment Upper Extremity Assessment: Overall WFL for tasks assessed   Lower Extremity Assessment Lower Extremity Assessment: Defer to PT evaluation   Cervical / Trunk Assessment Cervical / Trunk Assessment: Normal   Communication Communication Communication: No difficulties   Cognition Arousal/Alertness: Awake/alert Behavior During Therapy: WFL for tasks assessed/performed Overall Cognitive Status: Within Functional Limits for tasks assessed  General Comments       Exercises       Shoulder Instructions      Home Living Family/patient expects to be discharged to:: Group home [facility])                                        Prior Functioning/Environment Level of Independence: Independent with assistive device(s)        Comments: has been using RW for  mobility since hospitalization last week at [facility]        OT Problem List:     OT Treatment/Interventions:      OT Goals(Current goals can be found in the care plan section) Acute Rehab OT Goals OT Goal Formulation: All assessment and education complete, DC therapy  OT Frequency:     Barriers to D/C:            Co-evaluation              End of Session Equipment Utilized During Treatment: Gait belt;Rolling walker Nurse Communication: Mobility status  Activity Tolerance: Patient tolerated treatment well Patient left: in bed;with call bell/phone within reach   Time: 0816-0834 OT Time Calculation (min): 18 min Charges:  OT General Charges $OT Visit: 1 Procedure OT Evaluation $OT Eval Moderate Complexity: 1 Procedure G-Codes: OT G-codes **NOT FOR INPATIENT CLASS** Functional Assessment Tool Used: Clinical judgement Functional Limitation: Self care Self Care Current Status (G9211): At least 1 percent but less than 20 percent impaired, limited or restricted Self Care Goal Status (H4174): At least 1 percent but less than 20 percent impaired, limited or restricted Self Care Discharge Status [phone]): At least 1 percent but less than 20 percent impaired, limited or restricted   Binnie Kand M.S., OTR/L Pager: [phone]  01/11/2016, 8:40 AM

## 2016-01-11 NOTE — Evaluation (Addendum)
Physical Therapy Evaluation Patient Details Name: Douglas Mccoy MRN: [id number] DOB: 06/21/1945 Today's Date: 01/11/2016   History of Present Illness  70 y.o. male with medical history significant for Multiple Myeloma on remission  s/p stem cell transplant, hypothyroidism, CVA, CAD, COPD, Hypertension, diabetes,recent history of  Viral URI with Metapneurovirus treated supportively , recently admitted for hypotension and  generalized weakness, improved with IV hydration  And adjustment of his BP meds. He returns with similar symptoms today. He reports feeling dizzy and presyncopal.  Clinical Impression  Pt admitted with/for generalized weakness and hypotension.  Pt currently limited functionally due to the problems listed below.  (see problems list.)  Pt will benefit from PT to maximize function and safety to be able to get home safely with available assist.     Follow Up Recommendations Home health PT;Supervision for mobility/OOB    Equipment Recommendations  Rolling walker with 5" wheels    Recommendations for Other Services       Precautions / Restrictions Precautions Precautions: Fall Restrictions Weight Bearing Restrictions: No      Mobility  Bed Mobility Overal bed mobility: Modified Independent                Transfers Overall transfer level: Needs assistance Equipment used: Rolling walker (2 wheeled) Transfers: Sit to/from Stand Sit to Stand: Min guard         General transfer comment: Min guard for safety. No unsteadiness or dizziness.  Ambulation/Gait Ambulation/Gait assistance: Min guard Ambulation Distance (Feet): 400 Feet Assistive device: None Gait Pattern/deviations: Step-through pattern   Gait velocity interpretation: at or above normal speed for age/gender General Gait Details: multiple episodes of significant scissoring and drifting to maintain balance.  Pt not able to make significant changes in gait speed, became more unsteady with  horizontal scanning.  1 LOB needig steadying assist.  Stairs Stairs: Yes   Stair Management: One rail Right;Alternating pattern;Forwards Number of Stairs: 2 General stair comments: safe with rail, but would not be safe without.  Wheelchair Mobility    Modified Rankin (Stroke Patients Only)       Balance Overall balance assessment: Needs assistance   Sitting balance-Leahy Scale: Good       Standing balance-Leahy Scale: Fair                   Standardized Balance Assessment Standardized Balance Assessment : Dynamic Gait Index   Dynamic Gait Index Level Surface: Mild Impairment Change in Gait Speed: Mild Impairment Gait with Horizontal Head Turns: Mild Impairment Gait with Vertical Head Turns: Normal Gait and Pivot Turn: Mild Impairment Step Over Obstacle: Normal Step Around Obstacles: Normal Steps: Mild Impairment Total Score: 19       Pertinent Vitals/Pain Pain Assessment: Faces Faces Pain Scale: Hurts little more Pain Location: bil shoulders, neck Pain Descriptors / Indicators: Sore Pain Intervention(s): Monitored during session;Repositioned    Home Living Family/patient expects to be discharged to:: Group home                      Prior Function Level of Independence: Independent with assistive device(s)         Comments: has been using RW for mobility since hospitalization last week at [facility]        Extremity/Trunk Assessment   Upper Extremity Assessment Upper Extremity Assessment: Overall WFL for tasks assessed;Generalized weakness (overall weak)    Lower Extremity Assessment Lower Extremity Assessment: Overall WFL for tasks assessed (  general weakness larger muscle groups, but functional)    Cervical / Trunk Assessment Cervical / Trunk Assessment: Normal  Communication   Communication: No difficulties  Cognition Arousal/Alertness: Awake/alert Behavior During Therapy: WFL for tasks  assessed/performed Overall Cognitive Status: Within Functional Limits for tasks assessed                      General Comments General comments (skin integrity, edema, etc.): 19 on DGI suggestive of fall risk`.    Exercises     Assessment/Plan    PT Assessment Patient needs continued PT services  PT Problem List Decreased strength;Decreased activity tolerance;Decreased balance;Decreased mobility;Decreased knowledge of use of DME          PT Treatment Interventions DME instruction;Gait training;Functional mobility training;Therapeutic activities;Balance training;Patient/family education    PT Goals (Current goals can be found in the Care Plan section)  Acute Rehab PT Goals Patient Stated Goal: to get better, stronger. regain PLOF.  PT Goal Formulation: With patient Time For Goal Achievement: 01/18/16 Potential to Achieve Goals: Good    Frequency Min 3X/week   Barriers to discharge Decreased caregiver support      Co-evaluation               End of Session     Patient left: in bed;with bed alarm set;with call bell/phone within reach Nurse Communication: Mobility status    Functional Assessment Tool Used: clinical judgement Functional Limitation: Mobility: Walking and moving around Mobility: Walking and Moving Around Current Status (K1031): At least 1 percent but less than 20 percent impaired, limited or restricted Mobility: Walking and Moving Around Goal Status [phone]): At least 1 percent but less than 20 percent impaired, limited or restricted    Time: 0959-1019 PT Time Calculation (min) (ACUTE ONLY): 20 min   Charges:   PT Evaluation $PT Eval Moderate Complexity: 1 Procedure     PT G Codes:   PT G-Codes **NOT FOR INPATIENT CLASS** Functional Assessment Tool Used: clinical judgement Functional Limitation: Mobility: Walking and moving around Mobility: Walking and Moving Around Current Status (A6773): At least 1 percent but less than 20 percent  impaired, limited or restricted Mobility: Walking and Moving Around Goal Status [phone]): At least 1 percent but less than 20 percent impaired, limited or restricted    Burnard Bunting 01/11/2016, 10:33 AM 01/11/2016  Donnella Sham, [place] [phone]  (pager)

## 2016-01-11 NOTE — Care Management Note (Addendum)
Case Management Note  Patient Details  Name: Douglas Mccoy MRN: [id number] Date of Birth: [birth year]  Subjective/Objective:    Hypotension, weakness                Action/Plan: Discharge Planning: NCM  Spoke to pt and lives at [facility]. [facility]. [facility] will take pt to his appts. States he goes to the [facility]. Will order pt a RW at dc 01/11/2016.  PCP [facility]    [facility]/12/2016 [facility]. Pt states he gets his meds from the [place]. Will provide pt with goodrx coupon for his doxycycline for $12 at [facility]. He will have to pick up at retail pharmacy. [facility]   Pt states he will use retail pharmacy to pick up medications.    Expected Discharge Date:  01/12/2016               Expected Discharge Plan:  Home/Self Care  In-House Referral:  NA  Discharge planning Services  CM Consult  Post Acute Care Choice:  NA Choice offered to:  NA  DME Arranged:  RW DME Agency:  [place] Arranged:  NA HH Agency:  NA  Status of Service:  Completed, signed off  If discussed at [place] of Stay Meetings, dates discussed:    Additional Comments:  Erenest Rasher, RN 01/11/2016, 5:05 PM

## 2016-01-12 ENCOUNTER — Encounter (HOSPITAL_COMMUNITY): Payer: Self-pay | Admitting: General Practice

## 2016-01-12 LAB — CBC
HEMATOCRIT: 31.3 % — AB (ref 39.0–52.0)
Hemoglobin: 10.2 g/dL — ABNORMAL LOW (ref 13.0–17.0)
MCH: 27.4 pg (ref 26.0–34.0)
MCHC: 32.6 g/dL (ref 30.0–36.0)
MCV: 84.1 fL (ref 78.0–100.0)
Platelets: 70 10*3/uL — ABNORMAL LOW (ref 150–400)
RBC: 3.72 MIL/uL — ABNORMAL LOW (ref 4.22–5.81)
RDW: 16.7 % — AB (ref 11.5–15.5)
WBC: 5.3 10*3/uL (ref 4.0–10.5)

## 2016-01-12 LAB — BASIC METABOLIC PANEL
ANION GAP: 6 (ref 5–15)
BUN: 16 mg/dL (ref 6–20)
CALCIUM: 8.7 mg/dL — AB (ref 8.9–10.3)
CO2: 21 mmol/L — AB (ref 22–32)
Chloride: 112 mmol/L — ABNORMAL HIGH (ref 101–111)
Creatinine, Ser: 0.99 mg/dL (ref 0.61–1.24)
GFR calc Af Amer: >60 mL/min (ref >60)
GFR calc non Af Amer: >60 mL/min (ref >60)
GLUCOSE: 187 mg/dL — AB (ref 65–99)
Potassium: 3.6 mmol/L (ref 3.5–5.1)
Sodium: 139 mmol/L (ref 135–145)

## 2016-01-12 LAB — GLUCOSE, CAPILLARY
Glucose-Capillary: 164 mg/dL — ABNORMAL HIGH (ref 65–99)
Glucose-Capillary: 202 mg/dL — ABNORMAL HIGH (ref 65–99)

## 2016-01-12 MED ORDER — DOXYCYCLINE HYCLATE 100 MG PO TABS: 100.0000 mg | ORAL_TABLET | Freq: Two times a day (BID) | ORAL | 0 refills | Status: DC

## 2016-01-12 MED ORDER — LEVETIRACETAM ER 500 MG PO TB24: 500.0000 mg | ORAL_TABLET | Freq: Every day | ORAL | 0 refills | Status: DC

## 2016-01-12 NOTE — Progress Notes (Signed)
Patient ambulated 1000 feet in hallway with front-wheeled walker without difficulty.

## 2016-01-12 NOTE — Progress Notes (Signed)
Received call from [place], [place] [phone] from [facility]. Attempted call, left HIPAA voice message for return call. Jonnie Finner RN CCM Case Mgmt phone [phone]

## 2016-01-12 NOTE — Progress Notes (Signed)
Order received to discharge patient.  Patient expresses readiness to discharge.  Telemetry monitor was removed and CCMD notified.  PIV access removed without difficulty.  Discharge instructions, follow up, medications and instructions for their use were discussed with patient and patient voiced understanding.

## 2016-01-12 NOTE — Discharge Summary (Signed)
Physician Discharge Summary  Zell Hylton WTU:[id number] DOB: February 05, 1945 DOA: 01/10/2016  PCP: Ashley date: 01/10/2016 Discharge date: 01/12/2016  Time spent: 34 minutes  Recommendations for Outpatient Follow-up:  1. Complete doxycycline 01/18/2016 2. Needs CBC plus differential as an outpatient 3. Recommend consideration for steroids ongoing as an outpatient 4. Have refilled Keppra 500 XR 5. Prescription for rolling walker has been provided   Discharge Diagnoses:  Active Problems:   Acute renal failure (ARF) (HCC)   Type II diabetes mellitus with peripheral circulatory disorder (HCC)   Hypertension   COPD (chronic obstructive pulmonary disease) (HCC)   Malnutrition of moderate degree   CAD (coronary artery disease)   Hx of CABG   Chronic bilateral low back pain   Depression   Generalized weakness   Dehydration   Weakness generalized   Anemia   Pre-syncope   AKI (acute kidney injury) ([place])   Hypotension   Near syncope   Discharge Condition: Improved  Diet recommendation: Heart healthy diabetic low-salt  Filed Weights   01/10/16 1038 01/11/16 0400 01/12/16 0400  Weight: 64.9 kg (143 lb) 67.4 kg (148 lb 9.6 oz) 67.1 kg (147 lb 14.4 oz)    History of present illness:  70 y/o ? [place] resident Copd DM ty II Multiple myeloma in remission-             with chronic anemia and TCP             S/p BM transplant [place] at [place]  Prior CVA x2  Seizure disorder on keppra Bipolar Chr Diarrhea Hypothyroid Current [place]ingles to [place] CAd s/p CABG -peri-op CVA 2004  Recent admission to [facility] 12/23 with [place]  Re-admitted 12/28 with hypotension, syncope and fall Orthostatics noted + CT head neg BUn Creatinine on admit 63/2.2 [basline creatinine anywhere from 2-2.5]  Hospital Course:  Right-sided pneumonia, recently treated for medical virus-producing sputum and having cough and wheeze. We'll repeat 2 view chest x-ray 12/28.  Start  Mucinex, continue Tessalon per dose he did not pick up any of the medications on discharge from hospital recently I have restarted doxycycline which patient did not obtain-patient should be treated for another 7 days with by mouth doxycycline. 01/18/2015  Hypotension and syncope continue clonidine 0.1 mg and keep  lisinopril 20 We will reassess that pressure trends going forward Patient was orthostatic on admission possibly related to volume depletion and acute kidney injury Orthostatics on discharge were within normal limits and patient was ambulating fine A she will be given a rolling walker for syncope and unsteadiness  Acute kidney injury Admission BUN/creatinine 63/2.2 down to 40/1 2---on discharge BUN/creatinine 16/0.9 Okay to continue sodium bicarbonate 650 daily as has a chronic metabolic acidosis  Back pain and neck pain Is on tizanidine which was restarted 4 mg every 8 when necessary will give empiric pain control with OxyIR  On discharge and no pain medications were provided  Multiple myeloma in remission status post bone marrow transplant at [place] to the chest Continue Zovirax 200 twice a day as well as Neurontin 100 twice a day  Chronic diarrhea Needs outpatient workup-has been having this for 8 years Continue Imodium 2 mg 3 times a day Would not prescribe narcotics as an outpatient  Hyperlipidemia continue Zocor 10 mg daily at bedtime   Procedures:  Consultations:  none  Discharge Exam: Vitals:   01/12/16 0031 01/12/16 0400  BP: (!) 115/51 (!) 143/64  Pulse: 69 65  Resp:  18 16  Temp:  98 F (36.7 C)    General: alert pleasant in NAD Cardiovascular: s1 s2 no m/r/g Respiratory: clear no added sound   Discharge Instructions   Discharge Instructions    Diet - low sodium heart healthy    Complete by:  As directed    Discharge instructions    Complete by:  As directed    Complete entire course of antibiotic without fail. You  have a pneumonia and this needs to be completely treated We have given new scripts for some of your meds please ensure that you follow-[facility] with [facility] as well   Increase activity slowly    Complete by:  As directed      Current Discharge Medication List    START taking these medications   Details  levETIRAcetam (KEPPRA XR) 500 MG 24 hr tablet Take 1 tablet (500 mg total) by mouth daily. Qty: 30 tablet, Refills: 0      CONTINUE these medications which have CHANGED   Details  doxycycline (VIBRA-TABS) 100 MG tablet Take 1 tablet (100 mg total) by mouth every 12 (twelve) hours. Qty: 14 tablet, Refills: 0      CONTINUE these medications which have NOT CHANGED   Details  acyclovir (ZOVIRAX) 200 MG capsule Take 200 mg by mouth 2 (two) times daily.    albuterol (PROVENTIL HFA;VENTOLIN HFA) 108 (90 Base) MCG/ACT inhaler Inhale 1-2 puffs into the lungs every 8 (eight) hours as needed for wheezing or shortness of breath.    aspirin 325 MG tablet Take 325 mg by mouth daily.    cloNIDine (CATAPRES) 0.1 MG tablet Take 1 tablet (0.1 mg total) by mouth at bedtime. Qty: 30 tablet, Refills: 0    gabapentin (NEURONTIN) 300 MG capsule Take 600 mg by mouth 2 (two) times daily.     guaiFENesin (MUCINEX) 600 MG 12 hr tablet Take 1 tablet (600 mg total) by mouth 2 (two) times daily. Qty: 10 tablet, Refills: 0    guaiFENesin-dextromethorphan (ROBITUSSIN DM) 100-10 MG/5ML syrup Take 5 mLs by mouth every 4 (four) hours as needed for cough. Qty: 118 mL, Refills: 0    insulin aspart (NOVOLOG) 100 UNIT/ML injection Inject into the skin. Inject as per sliding scale: if  0-139= 0 ; 140-199 = 2 units, 200-250= 4 units; 251-299= 6 units; 300-349= 8 units; 350-450= 10 units, subcutaneously before meals related to DM. Call MD for CBG less than 60 or greater than 450    levothyroxine (SYNTHROID) 50 MCG tablet Take 1 tablet (50 mcg total) by mouth daily before breakfast. Qty: 30 tablet, Refills: 0     lisinopril (PRINIVIL,ZESTRIL) 20 MG tablet Take 20 mg by mouth daily.    Magnesium Oxide 420 MG TABS Take 840 mg by mouth 2 (two) times daily.    sertraline (ZOLOFT) 50 MG tablet Take 50 mg by mouth daily.    simvastatin (ZOCOR) 10 MG tablet Take 10 mg by mouth at bedtime.    sodium bicarbonate 650 MG tablet Take 1 tablet (650 mg total) by mouth daily. Qty: 30 tablet, Refills: 0    tiZANidine (ZANAFLEX) 4 MG tablet Take 4 mg by mouth every 8 (eight) hours as needed for muscle spasms.    benzonatate (TESSALON) 100 MG capsule Take 1 capsule (100 mg total) by mouth 3 (three) times daily as needed for cough. Qty: 20 capsule, Refills: 0    famotidine (PEPCID) 20 MG tablet Take 1 tablet (20 mg total) by mouth daily. Qty:  30 tablet, Refills: 0    fluticasone (FLONASE) 50 MCG/ACT nasal spray Place 2 sprays into both nostrils daily. Qty: 16 g, Refills: 2    loperamide (IMODIUM) 2 MG capsule Take 1 capsule (2 mg total) by mouth 3 (three) times daily as needed for diarrhea or loose stools. Qty: 30 capsule, Refills: 0      STOP taking these medications     carvedilol (COREG) 12.5 MG tablet        Allergies  Allergen Reactions  . Amoxapine And Related Other (See Comments)    unknown  . Amoxicillin Other (See Comments)    intolerance  . Augmentin [Amoxicillin-Pot Clavulanate] Other (See Comments)    Unknown reaction Has patient had a PCN reaction causing immediate rash, facial/tongue/throat swelling, SOB or lightheadedness with hypotension: unknown Has patient had a PCN reaction causing severe rash involving mucus membranes or skin necrosis: unknown Has patient had a PCN reaction that required hospitalization: unknown Has patient had a PCN reaction occurring within the last 10 years: no If all of the above answers are "NO", then may proceed with Cephalosporin use.       The results of significant diagnostics from this hospitalization (including imaging, microbiology, ancillary  and laboratory) are listed below for reference.    Significant Diagnostic Studies: Dg Chest 2 View  Result Date: 01/11/2016 CLINICAL DATA:  Productive cough for 1-2 weeks. Evaluate for pneumonia. EXAM: CHEST  2 VIEW COMPARISON:  01/04/2016 FINDINGS: Again noted is elevation of the right hemidiaphragm. Increasing density at the right lung base could reflect worsening atelectasis or pneumonia. Left lung is clear. Prior CABG. Heart is upper limits normal in size. No effusions or acute bony abnormality. IMPRESSION: Elevated right hemidiaphragm. Increasing right base atelectasis or infiltrate. Electronically Signed   By: Rolm Baptise M.D.   On: 01/11/2016 13:50   Dg Chest 2 View  Result Date: 01/04/2016 CLINICAL DATA:  Shortness of breath and cough EXAM: CHEST  2 VIEW COMPARISON:  January 01, 2016 FINDINGS: There is stable elevation of the left hemidiaphragm. There is mild bibasilar atelectasis. There is no edema or consolidation. Heart size and pulmonary vascularity are within normal limits. No adenopathy. Patient is status post coronary artery bypass grafting. There is atherosclerotic calcification in the aorta. Patient has undergone prior kyphoplasty at T8. IMPRESSION: Bibasilar atelectasis. Elevation right hemidiaphragm. No edema or consolidation. Aortic atherosclerosis. Status post coronary artery bypass grafting. Electronically Signed   By: Lowella Grip III M.D.   On: 01/04/2016 10:04   Ct Head Wo Contrast  Result Date: 01/10/2016 CLINICAL DATA:  Pain after fall.  Syncope. EXAM: CT HEAD WITHOUT CONTRAST TECHNIQUE: Contiguous axial images were obtained from the base of the skull through the vertex without intravenous contrast. COMPARISON:  None. FINDINGS: Brain: No subdural, epidural, or subarachnoid hemorrhage. Scattered white matter changes with a right corona radiata lacunar infarct on image 20. A lacunar infarct is seen in the left thalamus as well. No acute cortical ischemia. Cerebellum,  brainstem, and basal cisterns are normal. No mass, mass effect, or midline shift. Ventricles and sulci are unremarkable. Vascular: Calcified atherosclerosis seen in the intracranial carotid arteries. Skull: Normal. Negative for fracture or focal lesion. Sinuses/Orbits: Mucosal thickening is seen in the maxillary and ethmoid sinuses with a mucus or fluid seen in the left maxillary sinus. Mastoid air cells and middle ears are well aerated. Other: None. IMPRESSION: 1. No acute intracranial abnormality identified. White matter changes including a few scattered lacunar infarcts. No acute cortical ischemia  or bleed identified. Electronically Signed   By: Dorise Bullion III M.D   On: 01/10/2016 15:59   Dg Chest Portable 1 View  Result Date: 01/01/2016 CLINICAL DATA:  70 year old male with issues with low blood pressure for about 3 days. Normally takes high blood pressure medication. Diabetic. Prior history smoking. Initial encounter. EXAM: PORTABLE CHEST 1 VIEW COMPARISON:  12/08/2015 CT and chest x-ray. FINDINGS: Chronic elevated right hemidiaphragm. Central pulmonary vascular prominence stable without pulmonary edema, segmental infiltrate or pneumothorax. Calcified tortuous aorta.  Please see CT report recommendations. Heart size top-normal.  Coronary artery calcifications. T8 compression fracture treated with cement augmentation. IMPRESSION: No acute pulmonary abnormality. Calcified tortuous aorta. Please see recent CT report recommendations regarding follow-up. If there is any clinical suspicion of aortic abnormality contributing to low blood pressure then CT imaging would be necessary for further delineation. By plain film, findings appear stable. Electronically Signed   By: Genia Del M.D.   On: 01/01/2016 18:37    Microbiology: Recent Results (from the past 240 hour(s))  Respiratory Panel by PCR     Status: Abnormal   Collection Time: 01/02/16  1:11 PM  Result Value Ref Range Status   Adenovirus NOT  DETECTED NOT DETECTED Final   Coronavirus 229E NOT DETECTED NOT DETECTED Final   Coronavirus HKU1 NOT DETECTED NOT DETECTED Final   Coronavirus NL63 NOT DETECTED NOT DETECTED Final   Coronavirus OC43 NOT DETECTED NOT DETECTED Final   Metapneumovirus DETECTED (A) NOT DETECTED Final   Rhinovirus / Enterovirus NOT DETECTED NOT DETECTED Final   Influenza A NOT DETECTED NOT DETECTED Final   Influenza B NOT DETECTED NOT DETECTED Final   Parainfluenza Virus 1 NOT DETECTED NOT DETECTED Final   Parainfluenza Virus 2 NOT DETECTED NOT DETECTED Final   Parainfluenza Virus 3 NOT DETECTED NOT DETECTED Final   Parainfluenza Virus 4 NOT DETECTED NOT DETECTED Final   Respiratory Syncytial Virus NOT DETECTED NOT DETECTED Final   Bordetella pertussis NOT DETECTED NOT DETECTED Final   Chlamydophila pneumoniae NOT DETECTED NOT DETECTED Final   Mycoplasma pneumoniae NOT DETECTED NOT DETECTED Final     Labs: Basic Metabolic Panel:  Recent Labs Lab 01/06/16 0552 01/10/16 1112 01/11/16 0215 01/12/16 0923  NA 136 138 135 139  K 5.0 4.3 4.4 3.6  CL 107 113* 112* 112*  CO2 21* 19* 17* 21*  GLUCOSE 194* 148* 225* 187*  BUN 54* 63* 40* 16  CREATININE 1.50* 2.20* 1.29* 0.99  CALCIUM 9.0 8.3* 8.2* 8.7*  MG 1.8  --   --   --   PHOS 4.3  --   --   --    Liver Function Tests:  Recent Labs Lab 01/06/16 0552 01/10/16 1112 01/11/16 0215  AST 21 14* 15  ALT 25 17 16*  ALKPHOS 64 55 59  BILITOT 0.9 0.5 0.2*  PROT 7.5 5.9* 6.3*  ALBUMIN 3.7 2.9* 3.0*   No results for input(s): LIPASE, AMYLASE in the last 168 hours. No results for input(s): AMMONIA in the last 168 hours. CBC:  Recent Labs Lab 01/06/16 0552 01/10/16 1112 01/11/16 0215 01/12/16 0923  WBC 4.2 8.7 6.7 5.3  NEUTROABS 3.1 5.8  --   --   HGB 10.7* 9.1* 9.6* 10.2*  HCT 31.9* 27.3* 29.3* 31.3*  MCV 81.4 81.0 83.5 84.1  PLT 126* 87* 79* 70*   Cardiac Enzymes:  Recent Labs Lab 01/10/16 1513  TROPONINI <0.03   BNP: BNP (last  3 results) No results for input(s): BNP  in the last 8760 hours.  ProBNP (last 3 results) No results for input(s): PROBNP in the last 8760 hours.  CBG:  Recent Labs Lab 01/11/16 1116 01/11/16 1643 01/11/16 2102 01/12/16 0612 01/12/16 1129  GLUCAP 185* 165* 183* 164* 202*       Signed:  Nita Sells MD   [facility] 01/12/2016, 12:43 PM

## 2016-03-14 ENCOUNTER — Encounter (HOSPITAL_COMMUNITY): Payer: Self-pay | Admitting: Emergency Medicine

## 2016-03-14 ENCOUNTER — Emergency Department (HOSPITAL_COMMUNITY): Payer: Medicare Other

## 2016-03-14 ENCOUNTER — Emergency Department (HOSPITAL_COMMUNITY)
Admission: EM | Admit: 2016-03-14 | Discharge: 2016-03-14 | Disposition: A | Discharge status: Home / Self Care | Payer: Medicare Other | Attending: Emergency Medicine | Admitting: Emergency Medicine

## 2016-03-14 DIAGNOSIS — Z87891 Personal history of nicotine dependence: Secondary | ICD-10-CM | POA: Insufficient documentation

## 2016-03-14 DIAGNOSIS — J449 Chronic obstructive pulmonary disease, unspecified: Secondary | ICD-10-CM | POA: Insufficient documentation

## 2016-03-14 DIAGNOSIS — I2510 Atherosclerotic heart disease of native coronary artery without angina pectoris: Secondary | ICD-10-CM | POA: Insufficient documentation

## 2016-03-14 DIAGNOSIS — E860 Dehydration: Secondary | ICD-10-CM

## 2016-03-14 DIAGNOSIS — N289 Disorder of kidney and ureter, unspecified: Secondary | ICD-10-CM

## 2016-03-14 DIAGNOSIS — Z951 Presence of aortocoronary bypass graft: Secondary | ICD-10-CM | POA: Insufficient documentation

## 2016-03-14 DIAGNOSIS — E119 Type 2 diabetes mellitus without complications: Secondary | ICD-10-CM | POA: Insufficient documentation

## 2016-03-14 DIAGNOSIS — Z8673 Personal history of transient ischemic attack (TIA), and cerebral infarction without residual deficits: Secondary | ICD-10-CM | POA: Insufficient documentation

## 2016-03-14 DIAGNOSIS — I10 Essential (primary) hypertension: Secondary | ICD-10-CM | POA: Insufficient documentation

## 2016-03-14 DIAGNOSIS — E039 Hypothyroidism, unspecified: Secondary | ICD-10-CM | POA: Insufficient documentation

## 2016-03-14 DIAGNOSIS — Z794 Long term (current) use of insulin: Secondary | ICD-10-CM | POA: Insufficient documentation

## 2016-03-14 DIAGNOSIS — Z79899 Other long term (current) drug therapy: Secondary | ICD-10-CM | POA: Insufficient documentation

## 2016-03-14 LAB — CBC
HEMATOCRIT: 30.9 % — AB (ref 39.0–52.0)
Hemoglobin: 10.1 g/dL — ABNORMAL LOW (ref 13.0–17.0)
MCH: 26.4 pg (ref 26.0–34.0)
MCHC: 32.7 g/dL (ref 30.0–36.0)
MCV: 80.7 fL (ref 78.0–100.0)
Platelets: 103 10*3/uL — ABNORMAL LOW (ref 150–400)
RBC: 3.83 MIL/uL — ABNORMAL LOW (ref 4.22–5.81)
RDW: 16.7 % — AB (ref 11.5–15.5)
WBC: 7.2 10*3/uL (ref 4.0–10.5)

## 2016-03-14 LAB — COMPREHENSIVE METABOLIC PANEL
ALT: 15 U/L — AB (ref 17–63)
AST: 16 U/L (ref 15–41)
Albumin: 3.9 g/dL (ref 3.5–5.0)
Alkaline Phosphatase: 60 U/L (ref 38–126)
Anion gap: 7 (ref 5–15)
BILIRUBIN TOTAL: 1.3 mg/dL — AB (ref 0.3–1.2)
BUN: 35 mg/dL — AB (ref 6–20)
CALCIUM: 9.1 mg/dL (ref 8.9–10.3)
CO2: 23 mmol/L (ref 22–32)
CREATININE: 1.47 mg/dL — AB (ref 0.61–1.24)
Chloride: 105 mmol/L (ref 101–111)
GFR calc Af Amer: 53 mL/min — ABNORMAL LOW (ref >60)
GFR calc non Af Amer: 46 mL/min — ABNORMAL LOW (ref >60)
Glucose, Bld: 358 mg/dL — ABNORMAL HIGH (ref 65–99)
Potassium: 5.3 mmol/L — ABNORMAL HIGH (ref 3.5–5.1)
Sodium: 135 mmol/L (ref 135–145)
TOTAL PROTEIN: 7.3 g/dL (ref 6.5–8.1)

## 2016-03-14 LAB — URINALYSIS, ROUTINE W REFLEX MICROSCOPIC
Bacteria, UA: NONE SEEN
Bilirubin Urine: NEGATIVE
HGB URINE DIPSTICK: NEGATIVE
Ketones, ur: NEGATIVE mg/dL
Leukocytes, UA: NEGATIVE
Nitrite: NEGATIVE
PH: 7 (ref 5.0–8.0)
Protein, ur: NEGATIVE mg/dL
SPECIFIC GRAVITY, URINE: 1.014 (ref 1.005–1.030)
Squamous Epithelial / LPF: NONE SEEN

## 2016-03-14 LAB — MAGNESIUM: Magnesium: 1.7 mg/dL (ref 1.7–2.4)

## 2016-03-14 MED ORDER — SODIUM CHLORIDE 0.9 % IV BOLUS (SEPSIS): 1000.0000 mL | Freq: Once | INTRAVENOUS | Status: DC

## 2016-03-14 MED ORDER — SODIUM CHLORIDE 0.9 % IV BOLUS (SEPSIS): 250.0000 mL | Freq: Once | INTRAVENOUS | Status: DC

## 2016-03-14 MED ORDER — SODIUM CHLORIDE 0.9 % IV BOLUS (SEPSIS)
1000.0000 mL | Freq: Once | INTRAVENOUS | Status: AC
  Administered 2016-03-14: 1000 mL via INTRAVENOUS

## 2016-03-14 MED ORDER — VANCOMYCIN HCL IN DEXTROSE 1-5 GM/200ML-% IV SOLN: 1000.0000 mg | Freq: Once | INTRAVENOUS | Status: DC

## 2016-03-14 NOTE — ED Triage Notes (Signed)
Patient here from the [facility] with complaints of hypotension. Hx of hypertension taking lisinopril. 60s/40s initially. Staff reports ongoing issue. CBG 340.

## 2016-03-14 NOTE — ED Provider Notes (Signed)
[facility] DEPT Provider Note   CSN: [id number] Arrival date & time: 03/14/16  1310     History   Chief Complaint Chief Complaint  Patient presents with  . Hypotension    HPI Douglas Mccoy is a 71 y.o. male.  HPI Patient presents to the emergency department with complaints of low blood pressure.  He states he felt somewhat weak today.  He reports a history of diabetes and states his blood sugars been high over the past several days.  No fevers or chills.  No chest pain shortness breath.  Denies syncope.  No palpitations.  No recent changes medications.  He states she's intermittently been dealing with this since September.  He is currently staying at the homeless shelter.  He had been in prison for 2 years and when he came out he had no place to stay.  His been no trauma shelter and continues to be there now.  He is on fixed income and they're looking for housing for him.  He has no other complaints.  He states his diet is been poor because he is eating at the homeless shelter.   Past Medical History:  Diagnosis Date  . Anemia   . Anxiety   . Arthritis    "touch in my hands" (01/12/2016)  . CAD (coronary artery disease)   . Chronic back pain    "upper and lower" (01/12/2016)  . Chronic bronchitis ([place])   . Chronic kidney disease    "I'm on a renal diet" (01/12/2016)  . COPD (chronic obstructive pulmonary disease) ([place])   . Depression   . GERD (gastroesophageal reflux disease)   . Heart murmur    "when I was little"  . History of fractured vertebra    "between shoulder blades"  . History of hiatal hernia   . History of stem cell transplant ([place])   . Hyperlipidemia   . Hypertension   . Hypothyroidism   . Multiple myeloma ([place])   . Myocardial infarction    "I've had 2" (01/12/2016)  . Seizures ([place]) 2017   "when my potassium got too high" (01/12/2016)  . Shingles   . Sleep apnea    "I don't have a mask right now" (01/12/2016)  . Stroke ([place])    "I've had 2;  fingers stay colder" (pointes to left hand; 3rd, 4th, and 5th digits" (01/12/2016)  . Type II diabetes mellitus [facility])     Patient Active Problem List   Diagnosis Date Noted  . Pre-syncope 01/10/2016  . AKI (acute kidney injury) ([place])   . Hypotension   . Near syncope   . Generalized weakness 01/01/2016  . Dehydration 01/01/2016  . Weakness generalized 01/01/2016  . Anemia   . Essential hypertension, malignant 10/25/2015  . CAD (coronary artery disease) 10/25/2015  . Hx of CABG 10/25/2015  . Metabolic acidosis 79/15/0569  . Chronic bilateral low back pain 10/25/2015  . Depression 10/25/2015  . Late effect of cerebrovascular accident (CVA) 10/25/2015  . Malnutrition of moderate degree 10/22/2015  . Acute renal failure (ARF) ([place]) 10/20/2015  . Hyperkalemia 10/20/2015  . Type II diabetes mellitus with peripheral circulatory disorder ([place]) 10/20/2015  . Hypertension 10/20/2015  . COPD (chronic obstructive pulmonary disease) ([place]) 10/20/2015  . Multiple myeloma ([place]) 10/20/2015    Past Surgical History:  Procedure Laterality Date  . CAROTID ARTERY ANGIOPLASTY Right    Right carotid artery "cleaned out"  . CORONARY ARTERY BYPASS GRAFT  2004   CABG X4"  . LAPAROSCOPIC CHOLECYSTECTOMY    .  TONSILLECTOMY  ~ 1950       Home Medications    Prior to Admission medications   Medication Sig Start Date End Date Taking? Authorizing Provider  acyclovir (ZOVIRAX) 200 MG capsule Take 200 mg by mouth 2 (two) times daily.   Yes Historical Provider, MD  albuterol (PROVENTIL HFA;VENTOLIN HFA) 108 (90 Base) MCG/ACT inhaler Inhale 2 puffs into the lungs 3 (three) times daily.    Yes Historical Provider, MD  aspirin 325 MG tablet Take 325 mg by mouth daily with breakfast.    Yes Historical Provider, MD  cloNIDine (CATAPRES) 0.1 MG tablet Take 1 tablet (0.1 mg total) by mouth at bedtime. 10/23/15  Yes Florencia Reasons, MD  fluticasone (FLONASE) 50 MCG/ACT nasal spray Place 2 sprays into both nostrils  daily. Patient taking differently: Place 2 sprays into both nostrils daily as needed for allergies.  01/07/16  Yes [place], DO  gabapentin (NEURONTIN) 600 MG tablet Take 600 mg by mouth 2 (two) times daily.   Yes Historical Provider, MD  guaiFENesin-dextromethorphan (ROBITUSSIN DM) 100-10 MG/5ML syrup Take 5 mLs by mouth every 4 (four) hours as needed for cough. Patient taking differently: Take 10 mLs by mouth every 4 (four) hours as needed for cough.  01/06/16  Yes [place], DO  insulin aspart (NOVOLOG) 100 UNIT/ML injection Inject 0-10 Units into the skin 3 (three) times daily with meals. Inject as per sliding scale: 0 - 139=  0 units;  140 - 199 = 2 units;  200 - 250= 4 units;  251 - 299= 6 units;  300 - 349= 8 units;  350 - 450= 10 units  Call MD for CBG less than 60 or greater than 450   Yes Historical Provider, MD  levothyroxine (SYNTHROID) 50 MCG tablet Take 1 tablet (50 mcg total) by mouth daily before breakfast. 10/23/15  Yes Florencia Reasons, MD  lisinopril (PRINIVIL,ZESTRIL) 20 MG tablet Take 20 mg by mouth daily.   Yes Historical Provider, MD  Magnesium Oxide 420 MG TABS Take 840 mg by mouth 2 (two) times daily.   Yes Historical Provider, MD  Multiple Vitamins-Minerals (CENTRUM SILVER ADULT 50+) TABS Take 1 tablet by mouth daily.   Yes Historical Provider, MD  naproxen sodium (ANAPROX) 220 MG tablet Take 220 mg by mouth every 12 (twelve) hours as needed (for pain).   Yes Historical Provider, MD  polyethylene glycol (MIRALAX / GLYCOLAX) packet Take 17 g by mouth daily after breakfast.    Yes Historical Provider, MD  sertraline (ZOLOFT) 50 MG tablet Take 50 mg by mouth daily.   Yes Historical Provider, MD  tiZANidine (ZANAFLEX) 4 MG tablet Take 4 mg by mouth every 8 (eight) hours.    Yes Historical Provider, MD  levETIRAcetam (KEPPRA XR) 500 MG 24 hr tablet Take 1 tablet (500 mg total) by mouth daily. Patient not taking: Reported on 03/14/2016 01/12/16   Nita Sells,  MD  sodium bicarbonate 650 MG tablet Take 1 tablet (650 mg total) by mouth daily. Patient not taking: Reported on 03/14/2016 10/24/15   Florencia Reasons, MD    Family History Family History  Problem Relation Age of Onset  . Diabetes Mother   . Cancer Neg Hx   . Stroke Neg Hx     Social History Social History  Substance Use Topics  . Smoking status: Former Smoker    Packs/day: 2.00    Years: 35.00    Types: Cigarettes    Quit date: 04/08/1988  . Smokeless  tobacco: Never Used  . Alcohol use No     Allergies   Amoxapine and related; Amoxicillin; and Augmentin [amoxicillin-pot clavulanate]   Review of Systems Review of Systems  All other systems reviewed and are negative.    Physical Exam Updated Vital Signs BP 146/71 (BP Location: Left Arm)   Pulse 73   Temp 97.6 F (36.4 C) (Oral)   Resp 21   SpO2 98%   Physical Exam  Constitutional: He is oriented to person, place, and time. He appears well-developed and well-nourished.  HENT:  Head: Normocephalic and atraumatic.  Eyes: EOM are normal.  Neck: Normal range of motion.  Cardiovascular: Normal rate, regular rhythm, normal heart sounds and intact distal pulses.   Pulmonary/Chest: Effort normal and breath sounds normal. No respiratory distress.  Abdominal: Soft. He exhibits no distension. There is no tenderness.  Musculoskeletal: Normal range of motion.  Neurological: He is alert and oriented to person, place, and time.  Skin: Skin is warm and dry.  Psychiatric: He has a normal mood and affect. Judgment normal.  Nursing note and vitals reviewed.    ED Treatments / Results  Labs (all labs ordered are listed, but only abnormal results are displayed) Labs Reviewed  CBC - Abnormal; Notable for the following:       Result Value   RBC 3.83 (*)    Hemoglobin 10.1 (*)    HCT 30.9 (*)    RDW 16.7 (*)    Platelets 103 (*)    All other components within normal limits  COMPREHENSIVE METABOLIC PANEL - Abnormal; Notable for  the following:    Potassium 5.3 (*)    Glucose, Bld 358 (*)    BUN 35 (*)    Creatinine, Ser 1.47 (*)    ALT 15 (*)    Total Bilirubin 1.3 (*)    GFR calc non Af Amer 46 (*)    GFR calc Af Amer 53 (*)    All other components within normal limits  URINALYSIS, ROUTINE W REFLEX MICROSCOPIC - Abnormal; Notable for the following:    Color, Urine STRAW (*)    Glucose, UA >=500 (*)    All other components within normal limits  MAGNESIUM    EKG  EKG Interpretation None       Radiology Dg Chest Port 1 View  Result Date: 03/14/2016 CLINICAL DATA:  Sepsis. EXAM: PORTABLE CHEST 1 VIEW COMPARISON:  Radiographs of January 11, 2016. FINDINGS: The heart size and mediastinal contours are within normal limits. Status post coronary bypass graft. Atherosclerosis of thoracic aorta is noted. No pneumothorax or pleural effusion is noted. Elevated right hemidiaphragm is noted. Both lungs are clear. The visualized skeletal structures are unremarkable. IMPRESSION: Aortic atherosclerosis.  No acute cardiopulmonary abnormality seen. Electronically Signed   By: Marijo Conception, M.D.   On: 03/14/2016 14:00    Procedures Procedures (including critical care time)  Medications Ordered in ED Medications  sodium chloride 0.9 % bolus 1,000 mL (1,000 mLs Intravenous New Bag/Given 03/14/16 1540)  sodium chloride 0.9 % bolus 1,000 mL (1,000 mLs Intravenous New Bag/Given 03/14/16 1616)     Initial Impression / Assessment and Plan / ED Course  I have reviewed the triage vital signs and the nursing notes.  Pertinent labs & imaging results that were available during my care of the patient were reviewed by me and considered in my medical decision making (see chart for details).     Patient feels better after IV fluids.  Hyperglycemia.  Likely hyperosmolar hyperglycemia with mild dehydration.  Feels better after fluids.  Ambulatory.  Discharge home with primary care follow-up.  Mild renal insufficiency.  I have asked  that he contact his physicians at the [facility] to have this rechecked next week.  I've asked him if he is unable to follow-up with his physicians to return to the ER Wednesday for repeat check of his BUN and creatinine.  Have encouraged him to eat a diabetic diet.  Final Clinical Impressions(s) / ED Diagnoses   Final diagnoses:  Dehydration  Renal insufficiency    New Prescriptions New Prescriptions   No medications on file     Jola Schmidt, MD 03/14/16 [phone]

## 2016-03-14 NOTE — ED Notes (Signed)
Bed: RESB Expected date:  Expected time:  Means of arrival:  Comments: hypotensive

## 2016-03-17 ENCOUNTER — Encounter (HOSPITAL_COMMUNITY): Payer: Self-pay

## 2016-03-17 ENCOUNTER — Emergency Department (HOSPITAL_COMMUNITY)
Admission: EM | Admit: 2016-03-17 | Discharge: 2016-03-17 | Disposition: A | Discharge status: Home / Self Care | Payer: Medicare Other | Attending: Emergency Medicine | Admitting: Emergency Medicine

## 2016-03-17 DIAGNOSIS — I2510 Atherosclerotic heart disease of native coronary artery without angina pectoris: Secondary | ICD-10-CM | POA: Insufficient documentation

## 2016-03-17 DIAGNOSIS — E039 Hypothyroidism, unspecified: Secondary | ICD-10-CM | POA: Insufficient documentation

## 2016-03-17 DIAGNOSIS — Z7982 Long term (current) use of aspirin: Secondary | ICD-10-CM | POA: Insufficient documentation

## 2016-03-17 DIAGNOSIS — N189 Chronic kidney disease, unspecified: Secondary | ICD-10-CM | POA: Insufficient documentation

## 2016-03-17 DIAGNOSIS — I959 Hypotension, unspecified: Secondary | ICD-10-CM

## 2016-03-17 DIAGNOSIS — Z8673 Personal history of transient ischemic attack (TIA), and cerebral infarction without residual deficits: Secondary | ICD-10-CM | POA: Insufficient documentation

## 2016-03-17 DIAGNOSIS — Z87891 Personal history of nicotine dependence: Secondary | ICD-10-CM | POA: Insufficient documentation

## 2016-03-17 DIAGNOSIS — Z794 Long term (current) use of insulin: Secondary | ICD-10-CM | POA: Insufficient documentation

## 2016-03-17 DIAGNOSIS — I129 Hypertensive chronic kidney disease with stage 1 through stage 4 chronic kidney disease, or unspecified chronic kidney disease: Secondary | ICD-10-CM | POA: Insufficient documentation

## 2016-03-17 DIAGNOSIS — E1122 Type 2 diabetes mellitus with diabetic chronic kidney disease: Secondary | ICD-10-CM | POA: Insufficient documentation

## 2016-03-17 DIAGNOSIS — Z951 Presence of aortocoronary bypass graft: Secondary | ICD-10-CM | POA: Insufficient documentation

## 2016-03-17 DIAGNOSIS — J449 Chronic obstructive pulmonary disease, unspecified: Secondary | ICD-10-CM | POA: Insufficient documentation

## 2016-03-17 DIAGNOSIS — I252 Old myocardial infarction: Secondary | ICD-10-CM | POA: Insufficient documentation

## 2016-03-17 LAB — I-STAT CHEM 8, ED
BUN: 33 mg/dL — AB (ref 6–20)
Calcium, Ion: 1.18 mmol/L (ref 1.15–1.40)
Chloride: 102 mmol/L (ref 101–111)
Creatinine, Ser: 1.4 mg/dL — ABNORMAL HIGH (ref 0.61–1.24)
Glucose, Bld: 374 mg/dL — ABNORMAL HIGH (ref 65–99)
HCT: 28 % — ABNORMAL LOW (ref 39.0–52.0)
HEMOGLOBIN: 9.5 g/dL — AB (ref 13.0–17.0)
Potassium: 5.5 mmol/L — ABNORMAL HIGH (ref 3.5–5.1)
SODIUM: 135 mmol/L (ref 135–145)
TCO2: 23 mmol/L (ref 0–100)

## 2016-03-17 MED ORDER — LISINOPRIL 5 MG PO TABS: 5.0000 mg | ORAL_TABLET | Freq: Every day | ORAL | 0 refills | Status: DC

## 2016-03-17 NOTE — ED Notes (Signed)
Knife given to Shawn in security @ 13:55 until pt is D/C.

## 2016-03-17 NOTE — ED Triage Notes (Signed)
[facility] with low blood pressures. Pt state he went to the nursing station and fell light headiness. bp was 84/44 - P61 per facility reported.

## 2016-03-17 NOTE — ED Provider Notes (Signed)
[facility] DEPT Provider Note   CSN: [id number] Arrival date & time: 03/17/16  1319     History   Chief Complaint Chief Complaint  Patient presents with  . Hypotension    HPI Douglas Mccoy is a 71 y.o. male.  The history is provided by the patient.  Weakness  Primary symptoms comment: feeling lightheaded. This is a recurrent problem. The current episode started 1 to 2 hours ago. The problem has been resolved. There was no focality noted. There has been no fever. Pertinent negatives include no shortness of breath, no chest pain, no vomiting, no altered mental status, no confusion and no headaches. There were no medications administered prior to arrival.    Past Medical History:  Diagnosis Date  . Anemia   . Anxiety   . Arthritis    "touch in my hands" (01/12/2016)  . CAD (coronary artery disease)   . Chronic back pain    "upper and lower" (01/12/2016)  . Chronic bronchitis ([place])   . Chronic kidney disease    "I'm on a renal diet" (01/12/2016)  . COPD (chronic obstructive pulmonary disease) ([place])   . Depression   . GERD (gastroesophageal reflux disease)   . Heart murmur    "when I was little"  . History of fractured vertebra    "between shoulder blades"  . History of hiatal hernia   . History of stem cell transplant ([place])   . Hyperlipidemia   . Hypertension   . Hypothyroidism   . Multiple myeloma ([place])   . Myocardial infarction    "I've had 2" (01/12/2016)  . Seizures ([place]) 2017   "when my potassium got too high" (01/12/2016)  . Shingles   . Sleep apnea    "I don't have a mask right now" (01/12/2016)  . Stroke ([place])    "I've had 2; fingers stay colder" (pointes to left hand; 3rd, 4th, and 5th digits" (01/12/2016)  . Type II diabetes mellitus [facility])     Patient Active Problem List   Diagnosis Date Noted  . Pre-syncope 01/10/2016  . AKI (acute kidney injury) ([place])   . Hypotension   . Near syncope   . Generalized weakness 01/01/2016  . Dehydration  01/01/2016  . Weakness generalized 01/01/2016  . Anemia   . Essential hypertension, malignant 10/25/2015  . CAD (coronary artery disease) 10/25/2015  . Hx of CABG 10/25/2015  . Metabolic acidosis 72/53/6644  . Chronic bilateral low back pain 10/25/2015  . Depression 10/25/2015  . Late effect of cerebrovascular accident (CVA) 10/25/2015  . Malnutrition of moderate degree 10/22/2015  . Acute renal failure (ARF) ([place]) 10/20/2015  . Hyperkalemia 10/20/2015  . Type II diabetes mellitus with peripheral circulatory disorder ([place]) 10/20/2015  . Hypertension 10/20/2015  . COPD (chronic obstructive pulmonary disease) ([place]) 10/20/2015  . Multiple myeloma ([place]) 10/20/2015    Past Surgical History:  Procedure Laterality Date  . CAROTID ARTERY ANGIOPLASTY Right    Right carotid artery "cleaned out"  . CORONARY ARTERY BYPASS GRAFT  2004   CABG X4"  . LAPAROSCOPIC CHOLECYSTECTOMY    . TONSILLECTOMY  ~ 1950       Home Medications    Prior to Admission medications   Medication Sig Start Date End Date Taking? Authorizing Provider  acyclovir (ZOVIRAX) 200 MG capsule Take 200 mg by mouth 2 (two) times daily.    Historical Provider, MD  albuterol (PROVENTIL HFA;VENTOLIN HFA) 108 (90 Base) MCG/ACT inhaler Inhale 2 puffs into the lungs 3 (three) times daily.  Historical Provider, MD  aspirin 325 MG tablet Take 325 mg by mouth daily with breakfast.     Historical Provider, MD  cloNIDine (CATAPRES) 0.1 MG tablet Take 1 tablet (0.1 mg total) by mouth at bedtime. 10/23/15   Florencia Reasons, MD  fluticasone (FLONASE) 50 MCG/ACT nasal spray Place 2 sprays into both nostrils daily. Patient taking differently: Place 2 sprays into both nostrils daily as needed for allergies.  01/07/16   [place], DO  gabapentin (NEURONTIN) 600 MG tablet Take 600 mg by mouth 2 (two) times daily.    Historical Provider, MD  guaiFENesin-dextromethorphan (ROBITUSSIN DM) 100-10 MG/5ML syrup Take 5 mLs by mouth every 4  (four) hours as needed for cough. Patient taking differently: Take 10 mLs by mouth every 4 (four) hours as needed for cough.  01/06/16   [place], DO  insulin aspart (NOVOLOG) 100 UNIT/ML injection Inject 0-10 Units into the skin 3 (three) times daily with meals. Inject as per sliding scale: 0 - 139=  0 units;  140 - 199 = 2 units;  200 - 250= 4 units;  251 - 299= 6 units;  300 - 349= 8 units;  350 - 450= 10 units  Call MD for CBG less than 60 or greater than 450    Historical Provider, MD  levETIRAcetam (KEPPRA XR) 500 MG 24 hr tablet Take 1 tablet (500 mg total) by mouth daily. Patient not taking: Reported on 03/14/2016 01/12/16   Nita Sells, MD  levothyroxine (SYNTHROID) 50 MCG tablet Take 1 tablet (50 mcg total) by mouth daily before breakfast. 10/23/15   Florencia Reasons, MD  lisinopril (PRINIVIL,ZESTRIL) 20 MG tablet Take 20 mg by mouth daily.    Historical Provider, MD  Magnesium Oxide 420 MG TABS Take 840 mg by mouth 2 (two) times daily.    Historical Provider, MD  Multiple Vitamins-Minerals (CENTRUM SILVER ADULT 50+) TABS Take 1 tablet by mouth daily.    Historical Provider, MD  naproxen sodium (ANAPROX) 220 MG tablet Take 220 mg by mouth every 12 (twelve) hours as needed (for pain).    Historical Provider, MD  polyethylene glycol (MIRALAX / GLYCOLAX) packet Take 17 g by mouth daily after breakfast.     Historical Provider, MD  sertraline (ZOLOFT) 50 MG tablet Take 50 mg by mouth daily.    Historical Provider, MD  sodium bicarbonate 650 MG tablet Take 1 tablet (650 mg total) by mouth daily. Patient not taking: Reported on 03/14/2016 10/24/15   Florencia Reasons, MD  tiZANidine (ZANAFLEX) 4 MG tablet Take 4 mg by mouth every 8 (eight) hours.     Historical Provider, MD    Family History Family History  Problem Relation Age of Onset  . Diabetes Mother   . Cancer Neg Hx   . Stroke Neg Hx     Social History Social History  Substance Use Topics  . Smoking status: Former Smoker     Packs/day: 2.00    Years: 35.00    Types: Cigarettes    Quit date: 04/08/1988  . Smokeless tobacco: Never Used  . Alcohol use No     Allergies   Amoxapine and related; Amoxicillin; and Augmentin [amoxicillin-pot clavulanate]   Review of Systems Review of Systems  Respiratory: Negative for shortness of breath.   Cardiovascular: Negative for chest pain.  Gastrointestinal: Negative for vomiting.  Neurological: Positive for weakness. Negative for headaches.  Psychiatric/Behavioral: Negative for confusion.  All other systems reviewed and are negative.    Physical  Exam Updated Vital Signs BP 136/56   Pulse (!) 55   Temp 97.5 F (36.4 C) (Oral)   Resp (!) 0   Ht 5' 5"  (1.651 m)   Wt 150 lb (68 kg)   SpO2 95%   BMI 24.96 kg/m   Physical Exam  Constitutional: He is oriented to person, place, and time. He appears well-developed and well-nourished. No distress.  HENT:  Head: Normocephalic and atraumatic.  Nose: Nose normal.  Eyes: Conjunctivae are normal.  Neck: Neck supple. No tracheal deviation present.  Cardiovascular: Normal rate, regular rhythm and normal heart sounds.   Pulmonary/Chest: Effort normal and breath sounds normal. No respiratory distress.  Abdominal: Soft. He exhibits no distension. There is no tenderness.  Musculoskeletal: Normal range of motion.  Neurological: He is alert and oriented to person, place, and time.  Skin: Skin is warm and dry. Capillary refill takes less than 2 seconds.  Psychiatric: He has a normal mood and affect.  Vitals reviewed.    ED Treatments / Results  Labs (all labs ordered are listed, but only abnormal results are displayed) Labs Reviewed  I-STAT CHEM 8, ED - Abnormal; Notable for the following:       Result Value   Potassium 5.5 (*)    BUN 33 (*)    Creatinine, Ser 1.40 (*)    Glucose, Bld 374 (*)    Hemoglobin 9.5 (*)    HCT 28.0 (*)    All other components within normal limits    EKG  EKG  Interpretation  Date/Time:  Sunday March 17 2016 13:37:55 EST Ventricular Rate:  63 PR Interval:    QRS Duration: 89 QT Interval:  449 QTC Calculation: 460 R Axis:   2 Text Interpretation:  Sinus rhythm Atrial premature complexes Borderline T abnormalities, inferior leads Confirmed by Maddyx Vallie MD, Michaela Broski ([id number]) on 03/17/2016 2:09:00 PM       Radiology No results found.  Procedures Procedures (including critical care time)  Medications Ordered in ED Medications - No data to display   Initial Impression / Assessment and Plan / ED Course  I have reviewed the triage vital signs and the nursing notes.  Pertinent labs & imaging results that were available during my care of the patient were reviewed by me and considered in my medical decision making (see chart for details).     70  y.o. male presents with report of low BP. BPs are normal here. This happened after he took his lisinopril this morning. Second time this month this has happened. Cr is stable from previous reading but Pt continues to be volume depleted likely to poorly controlled hyperglycemia. No anion gap or other concerning metabolic derangement. No recurrent episodes here during monitoring. I recommended cutting his lisinopril from 20 mg to 5 mg daily [facility] and needs tighter glucose control. Plan to follow up with PCP as needed and return precautions discussed for worsening or new concerning symptoms.   Final Clinical Impressions(s) / ED Diagnoses   Final diagnoses:  Hypotension, unspecified hypotension type    New Prescriptions Discharge Medication List as of 03/17/2016  3:04 PM       Leo Grosser, MD 03/17/16 1737

## 2016-03-17 NOTE — ED Notes (Signed)
Bed: [id number] Expected date:  Expected time:  Means of arrival:  Comments: Hypotensive

## 2016-03-19 ENCOUNTER — Observation Stay (HOSPITAL_BASED_OUTPATIENT_CLINIC_OR_DEPARTMENT_OTHER)
Admission: EM | Admit: 2016-03-19 | Discharge: 2016-03-20 | Disposition: A | Discharge status: Home / Self Care | Payer: Medicare Other | Source: Home / Self Care | Attending: Emergency Medicine | Admitting: Emergency Medicine

## 2016-03-19 ENCOUNTER — Encounter (HOSPITAL_COMMUNITY): Payer: Self-pay

## 2016-03-19 ENCOUNTER — Observation Stay (HOSPITAL_COMMUNITY): Payer: Medicare Other

## 2016-03-19 DIAGNOSIS — N189 Chronic kidney disease, unspecified: Secondary | ICD-10-CM

## 2016-03-19 DIAGNOSIS — J449 Chronic obstructive pulmonary disease, unspecified: Secondary | ICD-10-CM | POA: Insufficient documentation

## 2016-03-19 DIAGNOSIS — I25810 Atherosclerosis of coronary artery bypass graft(s) without angina pectoris: Secondary | ICD-10-CM | POA: Insufficient documentation

## 2016-03-19 DIAGNOSIS — E1151 Type 2 diabetes mellitus with diabetic peripheral angiopathy without gangrene: Secondary | ICD-10-CM | POA: Diagnosis present

## 2016-03-19 DIAGNOSIS — E1122 Type 2 diabetes mellitus with diabetic chronic kidney disease: Secondary | ICD-10-CM | POA: Insufficient documentation

## 2016-03-19 DIAGNOSIS — Z79899 Other long term (current) drug therapy: Secondary | ICD-10-CM

## 2016-03-19 DIAGNOSIS — Z8673 Personal history of transient ischemic attack (TIA), and cerebral infarction without residual deficits: Secondary | ICD-10-CM | POA: Insufficient documentation

## 2016-03-19 DIAGNOSIS — I959 Hypotension, unspecified: Secondary | ICD-10-CM | POA: Diagnosis not present

## 2016-03-19 DIAGNOSIS — I252 Old myocardial infarction: Secondary | ICD-10-CM

## 2016-03-19 DIAGNOSIS — Z794 Long term (current) use of insulin: Secondary | ICD-10-CM | POA: Insufficient documentation

## 2016-03-19 DIAGNOSIS — R0989 Other specified symptoms and signs involving the circulatory and respiratory systems: Secondary | ICD-10-CM

## 2016-03-19 DIAGNOSIS — Z87891 Personal history of nicotine dependence: Secondary | ICD-10-CM

## 2016-03-19 DIAGNOSIS — Z7982 Long term (current) use of aspirin: Secondary | ICD-10-CM | POA: Insufficient documentation

## 2016-03-19 DIAGNOSIS — R55 Syncope and collapse: Secondary | ICD-10-CM | POA: Diagnosis present

## 2016-03-19 DIAGNOSIS — I951 Orthostatic hypotension: Secondary | ICD-10-CM

## 2016-03-19 DIAGNOSIS — Z951 Presence of aortocoronary bypass graft: Secondary | ICD-10-CM | POA: Insufficient documentation

## 2016-03-19 DIAGNOSIS — I2510 Atherosclerotic heart disease of native coronary artery without angina pectoris: Secondary | ICD-10-CM | POA: Diagnosis present

## 2016-03-19 DIAGNOSIS — I129 Hypertensive chronic kidney disease with stage 1 through stage 4 chronic kidney disease, or unspecified chronic kidney disease: Secondary | ICD-10-CM | POA: Insufficient documentation

## 2016-03-19 DIAGNOSIS — I25110 Atherosclerotic heart disease of native coronary artery with unstable angina pectoris: Secondary | ICD-10-CM | POA: Diagnosis not present

## 2016-03-19 DIAGNOSIS — E039 Hypothyroidism, unspecified: Secondary | ICD-10-CM | POA: Insufficient documentation

## 2016-03-19 DIAGNOSIS — I10 Essential (primary) hypertension: Secondary | ICD-10-CM | POA: Diagnosis present

## 2016-03-19 LAB — CBC WITH DIFFERENTIAL/PLATELET
Basophils Absolute: 0 10*3/uL (ref 0.0–0.1)
Basophils Relative: 0 %
EOS ABS: 0 10*3/uL (ref 0.0–0.7)
Eosinophils Relative: 0 %
HEMATOCRIT: 27.7 % — AB (ref 39.0–52.0)
HEMOGLOBIN: 9.2 g/dL — AB (ref 13.0–17.0)
LYMPHS ABS: 0.8 10*3/uL (ref 0.7–4.0)
Lymphocytes Relative: 19 %
MCH: 27.3 pg (ref 26.0–34.0)
MCHC: 33.2 g/dL (ref 30.0–36.0)
MCV: 82.2 fL (ref 78.0–100.0)
MONOS PCT: 18 %
Monocytes Absolute: 0.7 10*3/uL (ref 0.1–1.0)
NEUTROS ABS: 2.5 10*3/uL (ref 1.7–7.7)
NEUTROS PCT: 63 %
Platelets: 72 10*3/uL — ABNORMAL LOW (ref 150–400)
RBC: 3.37 MIL/uL — ABNORMAL LOW (ref 4.22–5.81)
RDW: 17.5 % — ABNORMAL HIGH (ref 11.5–15.5)
WBC: 4.1 10*3/uL (ref 4.0–10.5)

## 2016-03-19 LAB — COMPREHENSIVE METABOLIC PANEL
ALT: 16 U/L — ABNORMAL LOW (ref 17–63)
AST: 19 U/L (ref 15–41)
Albumin: 3.5 g/dL (ref 3.5–5.0)
Alkaline Phosphatase: 57 U/L (ref 38–126)
Anion gap: 8 (ref 5–15)
BILIRUBIN TOTAL: 1.2 mg/dL (ref 0.3–1.2)
BUN: 22 mg/dL — ABNORMAL HIGH (ref 6–20)
CALCIUM: 9 mg/dL (ref 8.9–10.3)
CO2: 22 mmol/L (ref 22–32)
Chloride: 106 mmol/L (ref 101–111)
Creatinine, Ser: 1.28 mg/dL — ABNORMAL HIGH (ref 0.61–1.24)
GFR calc non Af Amer: 54 mL/min — ABNORMAL LOW (ref >60)
Glucose, Bld: 155 mg/dL — ABNORMAL HIGH (ref 65–99)
Potassium: 4.8 mmol/L (ref 3.5–5.1)
Sodium: 136 mmol/L (ref 135–145)
TOTAL PROTEIN: 6.3 g/dL — AB (ref 6.5–8.1)

## 2016-03-19 LAB — I-STAT TROPONIN, ED: TROPONIN I, POC: 0.01 ng/mL (ref 0.00–0.08)

## 2016-03-19 LAB — TSH: TSH: 5.763 u[IU]/mL — AB (ref 0.350–4.500)

## 2016-03-19 LAB — GLUCOSE, CAPILLARY: GLUCOSE-CAPILLARY: 327 mg/dL — AB (ref 65–99)

## 2016-03-19 LAB — CORTISOL: Cortisol, Plasma: 9.3 ug/dL

## 2016-03-19 MED ORDER — GABAPENTIN 600 MG PO TABS
600.0000 mg | ORAL_TABLET | Freq: Two times a day (BID) | ORAL | Status: DC
  Administered 2016-03-19 – 2016-03-20 (×2): 600 mg via ORAL
  Filled 2016-03-19 (×2): qty 1

## 2016-03-19 MED ORDER — POLYETHYLENE GLYCOL 3350 17 G PO PACK: 17.0000 g | PACK | Freq: Every day | ORAL | Status: DC | PRN

## 2016-03-19 MED ORDER — LEVOTHYROXINE SODIUM 50 MCG PO TABS
50.0000 ug | ORAL_TABLET | Freq: Every day | ORAL | Status: DC
  Administered 2016-03-20: 50 ug via ORAL
  Filled 2016-03-19: qty 1

## 2016-03-19 MED ORDER — FLUTICASONE PROPIONATE 50 MCG/ACT NA SUSP: 2.0000 | Freq: Every day | NASAL | Status: DC | PRN

## 2016-03-19 MED ORDER — SODIUM CHLORIDE 0.9% FLUSH
3.0000 mL | Freq: Two times a day (BID) | INTRAVENOUS | Status: DC
  Administered 2016-03-19 – 2016-03-20 (×2): 3 mL via INTRAVENOUS

## 2016-03-19 MED ORDER — SERTRALINE HCL 50 MG PO TABS
50.0000 mg | ORAL_TABLET | Freq: Every day | ORAL | Status: DC
  Administered 2016-03-19 – 2016-03-20 (×2): 50 mg via ORAL
  Filled 2016-03-19 (×2): qty 1

## 2016-03-19 MED ORDER — TIZANIDINE HCL 4 MG PO TABS
4.0000 mg | ORAL_TABLET | Freq: Three times a day (TID) | ORAL | Status: DC | PRN
  Administered 2016-03-19: 4 mg via ORAL
  Filled 2016-03-19 (×2): qty 1

## 2016-03-19 MED ORDER — ACETAMINOPHEN 650 MG RE SUPP: 650.0000 mg | Freq: Four times a day (QID) | RECTAL | Status: DC | PRN

## 2016-03-19 MED ORDER — ACYCLOVIR 200 MG PO CAPS
200.0000 mg | ORAL_CAPSULE | Freq: Two times a day (BID) | ORAL | Status: DC
  Administered 2016-03-19 – 2016-03-20 (×2): 200 mg via ORAL
  Filled 2016-03-19 (×3): qty 1

## 2016-03-19 MED ORDER — ACETAMINOPHEN 325 MG PO TABS: 650.0000 mg | ORAL_TABLET | Freq: Four times a day (QID) | ORAL | Status: DC | PRN

## 2016-03-19 MED ORDER — AMLODIPINE BESYLATE 5 MG PO TABS
2.5000 mg | ORAL_TABLET | Freq: Every day | ORAL | Status: DC
  Administered 2016-03-20: 2.5 mg via ORAL
  Filled 2016-03-19: qty 1

## 2016-03-19 MED ORDER — ASPIRIN EC 81 MG PO TBEC
81.0000 mg | DELAYED_RELEASE_TABLET | Freq: Every day | ORAL | Status: DC
  Administered 2016-03-20: 81 mg via ORAL
  Filled 2016-03-19: qty 1

## 2016-03-19 MED ORDER — ONDANSETRON HCL 4 MG PO TABS: 4.0000 mg | ORAL_TABLET | Freq: Four times a day (QID) | ORAL | Status: DC | PRN

## 2016-03-19 MED ORDER — SODIUM CHLORIDE 0.9 % IV BOLUS (SEPSIS)
1000.0000 mL | Freq: Once | INTRAVENOUS | Status: AC
  Administered 2016-03-19: 1000 mL via INTRAVENOUS

## 2016-03-19 MED ORDER — ALBUTEROL SULFATE (2.5 MG/3ML) 0.083% IN NEBU
2.5000 mg | INHALATION_SOLUTION | Freq: Three times a day (TID) | RESPIRATORY_TRACT | Status: DC
  Administered 2016-03-19 – 2016-03-20 (×3): 2.5 mg via RESPIRATORY_TRACT
  Filled 2016-03-19 (×3): qty 3

## 2016-03-19 MED ORDER — INSULIN ASPART 100 UNIT/ML ~~LOC~~ SOLN
0.0000 [IU] | Freq: Three times a day (TID) | SUBCUTANEOUS | Status: DC
Start: 2016-03-20 — End: 2016-03-20
  Administered 2016-03-20: 8 [IU] via SUBCUTANEOUS
  Administered 2016-03-20: 5 [IU] via SUBCUTANEOUS

## 2016-03-19 MED ORDER — ONDANSETRON HCL 4 MG/2ML IJ SOLN
4.0000 mg | Freq: Four times a day (QID) | INTRAMUSCULAR | Status: DC | PRN
Start: 2016-03-19 — End: 2016-03-20

## 2016-03-19 MED ORDER — INSULIN ASPART 100 UNIT/ML ~~LOC~~ SOLN
0.0000 [IU] | Freq: Every day | SUBCUTANEOUS | Status: DC
  Administered 2016-03-19: 4 [IU] via SUBCUTANEOUS

## 2016-03-19 MED ORDER — LEVETIRACETAM ER 500 MG PO TB24
500.0000 mg | ORAL_TABLET | Freq: Every day | ORAL | Status: DC
  Administered 2016-03-20: 500 mg via ORAL
  Filled 2016-03-19: qty 1

## 2016-03-19 MED ORDER — CALCIUM CARBONATE ANTACID 500 MG PO CHEW
3.0000 | CHEWABLE_TABLET | Freq: Two times a day (BID) | ORAL | Status: DC | PRN
  Administered 2016-03-19: 600 mg via ORAL
  Filled 2016-03-19: qty 3

## 2016-03-19 NOTE — ED Notes (Signed)
Pt eating dinner tray °

## 2016-03-19 NOTE — H&P (Signed)
[facility] History and Physical   Patient: Douglas Mccoy FKC:[id number]   PCP: [facility] DOB: 28-Dec-1945   DOA: 03/19/2016   DOS: 03/19/2016   DOS: the patient was seen and examined on 03/19/2016  Patient coming from: The patient is coming from home.  Chief Complaint: passing out  HPI: Douglas Mccoy is a 71 y.o. male with Past medical history of Type 2 diabetes mellitus, neuropathy, HTN, PVD, COPD, CAD S/P CABG, multiple myeloma in remission, recurrent syncope. Patient presented with complaints of episode of syncope. Patient was feeling better when he woke up this morning and did not have any acute complaint, while he was eating his lunch suddenly passed out and did not remember anything beyond that. Denies any dizziness lightheadedness chest pain palpitation before or after the event. No nausea no vomiting. He mentions he has chronic diarrhea. He has not been taking his clonidine and lisinopril for last 1-1/2 week as per provider. No burning urination no fever no chills no cough  ED Course: Presented with hypotension, orthostatic vitals were negative, improved with IV hydration. Currently denies any acute complaint. Refer for admission for further workup.  At his baseline ambulates without any walker And is independent for most of his ADL; manages his medication on his own.  Review of Systems: as mentioned in the history of present illness.  All other systems reviewed and are negative.  Past Medical History:  Diagnosis Date  . Anemia   . Anxiety   . Arthritis    "touch in my hands" (01/12/2016)  . CAD (coronary artery disease)   . Chronic back pain    "upper and lower" (01/12/2016)  . Chronic bronchitis ([place])   . Chronic kidney disease    "I'm on a renal diet" (01/12/2016)  . COPD (chronic obstructive pulmonary disease) ([place])   . Depression   . GERD (gastroesophageal reflux disease)   . Heart murmur    "when I was little"  . History of fractured vertebra    "between shoulder blades"  . History of hiatal hernia   . History of stem cell transplant ([place])   . Hyperlipidemia   . Hypertension   . Hypothyroidism   . Multiple myeloma ([place])   . Myocardial infarction    "I've had 2" (01/12/2016)  . Seizures ([place]) 2017   "when my potassium got too high" (01/12/2016)  . Shingles   . Sleep apnea    "I don't have a mask right now" (01/12/2016)  . Stroke ([place])    "I've had 2; fingers stay colder" (pointes to left hand; 3rd, 4th, and 5th digits" (01/12/2016)  . Type II diabetes mellitus ([place])    Past Surgical History:  Procedure Laterality Date  . CAROTID ARTERY ANGIOPLASTY Right    Right carotid artery "cleaned out"  . CORONARY ARTERY BYPASS GRAFT  2004   CABG X4"  . LAPAROSCOPIC CHOLECYSTECTOMY    . TONSILLECTOMY  ~ 1950   Social History:  reports that he quit smoking about 27 years ago. His smoking use included Cigarettes. He has a 70.00 pack-year smoking history. He has never used smokeless tobacco. He reports that he does not drink alcohol or use drugs.  No Active Allergies   Family History  Problem Relation Age of Onset  . Diabetes Mother   . Cancer Neg Hx   . Stroke Neg Hx      Prior to Admission medications   Medication Sig Start Date End Date Taking? Authorizing Provider  aspirin EC 81  MG tablet Take 81 mg by mouth daily.   Yes Historical Provider, MD  polyethylene glycol (MIRALAX / GLYCOLAX) packet Take 17 g by mouth daily.   Yes Historical Provider, MD  acyclovir (ZOVIRAX) 200 MG capsule Take 200 mg by mouth 2 (two) times daily.    Historical Provider, MD  albuterol (PROVENTIL HFA;VENTOLIN HFA) 108 (90 Base) MCG/ACT inhaler Inhale 2 puffs into the lungs 3 (three) times daily.     Historical Provider, MD  fluticasone (FLONASE) 50 MCG/ACT nasal spray Place 2 sprays into both nostrils daily. Patient taking differently: Place 2 sprays into both nostrils daily as needed for allergies.  01/07/16   [place], DO  gabapentin  (NEURONTIN) 600 MG tablet Take 600 mg by mouth 2 (two) times daily.    Historical Provider, MD  guaiFENesin-dextromethorphan (ROBITUSSIN DM) 100-10 MG/5ML syrup Take 5 mLs by mouth every 4 (four) hours as needed for cough. Patient taking differently: Take 10 mLs by mouth every 4 (four) hours as needed for cough.  01/06/16   [place], DO  insulin aspart (NOVOLOG) 100 UNIT/ML injection Inject 0-10 Units into the skin 3 (three) times daily with meals. Inject as per sliding scale: 0 - 139=  0 units;  140 - 199 = 2 units;  200 - 250= 4 units;  251 - 299= 6 units;  300 - 349= 8 units;  350 - 450= 10 units  Call MD for CBG less than 60 or greater than 450    Historical Provider, MD  levETIRAcetam (KEPPRA XR) 500 MG 24 hr tablet Take 1 tablet (500 mg total) by mouth daily. Patient not taking: Reported on 03/14/2016 01/12/16   Nita Sells, MD  levothyroxine (SYNTHROID) 50 MCG tablet Take 1 tablet (50 mcg total) by mouth daily before breakfast. 10/23/15   Florencia Reasons, MD  lisinopril (PRINIVIL,ZESTRIL) 5 MG tablet Take 1 tablet (5 mg total) by mouth daily. 03/17/16   Leo Grosser, MD  Magnesium Oxide 420 MG TABS Take 840 mg by mouth 2 (two) times daily.    Historical Provider, MD  Multiple Vitamins-Minerals (CENTRUM SILVER ADULT 50+) TABS Take 1 tablet by mouth daily.    Historical Provider, MD  naproxen sodium (ANAPROX) 220 MG tablet Take 220 mg by mouth every 12 (twelve) hours as needed (for pain).    Historical Provider, MD  sertraline (ZOLOFT) 50 MG tablet Take 50 mg by mouth daily.    Historical Provider, MD  tiZANidine (ZANAFLEX) 4 MG tablet Take 4 mg by mouth every 8 (eight) hours.     Historical Provider, MD    Physical Exam: Vitals:   03/19/16 1715 03/19/16 1730 03/19/16 1745 03/19/16 1800  BP: 145/58 143/56 150/58 151/71  Pulse: (!) 51 (!) 49 (!) 56 (!) 56  Resp: _0 Temp:      TempSrc:      SpO2: 99% 99% 93% 99%  Weight:      Height:        General: Alert, Awake  and Oriented to Time, Place and Person. Appear in mild distress, affect appropriate Eyes: PERRL, Conjunctiva normal ENT: Oral Mucosa clear moist. Neck: no JVD, no Abnormal Mass Or lumps Cardiovascular: S1 and S2 Present, no Murmur, Peripheral Pulses Present Respiratory: Bilateral Air entry equal and Decreased, no use of accessory muscle, Clear to Auscultation, no Crackles, no wheezes Abdomen: Bowel Sound present, Soft and no tenderness Skin: no redness, no Rash, no induration Extremities: no Pedal edema, no calf tenderness Neurologic:  Grossly no focal neuro deficit. Bilaterally Equal motor strength  Labs on Admission:  CBC:  Recent Labs Lab 03/14/16 1501 03/17/16 1504 03/19/16 1550  WBC 7.2  --  4.1  NEUTROABS  --   --  2.5  HGB 10.1* 9.5* 9.2*  HCT 30.9* 28.0* 27.7*  MCV 80.7  --  82.2  PLT 103*  --  72*   Basic Metabolic Panel:  Recent Labs Lab 03/14/16 1501 03/17/16 1504 03/19/16 1550  NA 135 135 136  K 5.3* 5.5* 4.8  CL 105 102 106  CO2 23  --  22  GLUCOSE 358* 374* 155*  BUN 35* 33* 22*  CREATININE 1.47* 1.40* 1.28*  CALCIUM 9.1  --  9.0  MG 1.7  --   --    GFR: Estimated Creatinine Clearance: 45.4 mL/min (by C-G formula based on SCr of 1.28 mg/dL (H)). Liver Function Tests:  Recent Labs Lab 03/14/16 1501 03/19/16 1550  AST 16 19  ALT 15* 16*  ALKPHOS 60 57  BILITOT 1.3* 1.2  PROT 7.3 6.3*  ALBUMIN 3.9 3.5   Thyroid Function Tests:  Recent Labs  03/19/16 1550  TSH 5.763*   Urine analysis:    Component Value Date/Time   COLORURINE STRAW (A) 03/14/2016 1427   APPEARANCEUR CLEAR 03/14/2016 1427   LABSPEC 1.014 03/14/2016 1427   PHURINE 7.0 03/14/2016 1427   GLUCOSEU >=500 (A) 03/14/2016 1427   HGBUR NEGATIVE 03/14/2016 1427   BILIRUBINUR NEGATIVE 03/14/2016 1427   KETONESUR NEGATIVE 03/14/2016 1427   PROTEINUR NEGATIVE 03/14/2016 1427   NITRITE NEGATIVE 03/14/2016 1427   LEUKOCYTESUR NEGATIVE 03/14/2016 1427    Radiological Exams on  Admission: No results found. EKG: Independently reviewed. nonspecific ST and T waves changes, sinus bradycardia.  Assessment/Plan 1. Syncope Sinus bradycardia Patient presents with recurrent episodes of syncope. This is his third visit in the ER within last 2 weeks with syncope or near syncope Patient has not been taking his blood pressure medication and he does not have any prodrome or aura before this. No focal deficit on my examination. He tells me that he never had a cardiac workup for his sleep recurrent syncope but he was recommended to be cleared by cardiology before his cataract surgery at [facility], and is currently waiting for an appointment. Patient is having sinus bradycardia on telemetry, no significant heart block identified on EKG. Blood pressure is now elevated. Multiple etiology of patient's syncope and presyncope- polypharmacy, gabapentin, antihypertensive medication, Keppra, Zanaflex, Zoloft all can cause dizziness; cardiac arrhythmia with AV block would be the most concerning cause, autonomic dysfunction due to diabetes in a patient already has PVD as well as neuropathy would be another differential. Recently had an echocardiogram which was unremarkable for any valvular abnormality, EF was normal. At present I recommend to admit the patient for observation, monitor on telemetry, if he has any AV block cardiology would be to be consulted. If he does not have any abnormal arrhythmia I recommend 30 day event monitor for the patient,  Serial neuro checks, recheck orthostatic., TED stockings recommended indefinitely.  2. Essential hypertension. Blood pressure currently elevated but has multiple episodes of low blood pressure. Was on clonidine and lisinopril and multiple other antihypertensive medications. At present I would initiate low-dose Norvasc, discontinue all other antihypertensive medications. Continue to stocking. Monitor. Recheck orthostatic in the hospital.  3. Type 2  diabetes mellitus. Currently on sliding scale insulin, will recheck hemoglobin A1c, last was October 2017 7.1. Continue sliding scale in the hospital  as well. Possibility of autoimmune disturbances from diabetes cannot be ruled out.  4. Coronary artery disease S/P CABG. Continue aspirin.  5. Multiple myeloma. 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  S/P bone marrow transplant at [facility]. Currently in remission followed by [place] as well as with the [place]. On acyclovir for herpes suppression. Not on any medication. Renal function stable, hemoglobin stable, platelets mildly low would likely fluctuate. Monitor.  6. Bilateral crackles. No cough or shortness of breath reported by patient. I will get a chest x-ray to further evaluate. Not on oxygen at present.  Nutrition: cardiac diet DVT Prophylaxis: mechanical compression device  Advance goals of care discussion: Full code, "if you can fix me, try it, if there is no way of fixing, I would like to be a vegetable". His sister is his next dose of kin   Consults: none  Family Communication: no family was present at bedside, at the time of interview.  Disposition: Admitted as observation telemetry unit. Likely to be discharged home, in 1-2 day.  Author: Berle Mull, MD [facility] Pager: [phone] 03/19/2016  If 7PM-7AM, please contact night-coverage [email or web address] Password [email or web address]

## 2016-03-19 NOTE — ED Triage Notes (Signed)
Pt brought in by EMS from [facility] due to having a unresponsive episode and was found to be hypotensive with SBP in the 70's. Pt was cool and pale on presentation. Pt placed in trendelenburg position and given 500cc of NS. Pt SBP improved to 90-100's. Pt denies pain at this time. Pt a&ox4.

## 2016-03-19 NOTE — ED Notes (Signed)
Attempted report x1. 

## 2016-03-19 NOTE — ED Notes (Signed)
Pt given snack and diet gingerale per Dr. Posey Pronto.

## 2016-03-19 NOTE — ED Provider Notes (Signed)
[facility] DEPT Provider Note   CSN: [id number] Arrival date & time: 03/19/16  1339     History   Chief Complaint Chief Complaint  Patient presents with  . Loss of Consciousness    HPI Douglas Mccoy is a 71 y.o. male.  HPI 71 year old male who presents with syncope. He has a history of coronary artery disease, diabetes, hypertension, hyperlipidemia. Has had recurrent issues with orthostatic hypotension since September. This is his third ED visit in 1 week for recurrent hypotension and near syncope/syncope. States that he has not taken his lisinopril or clonidine in about a week and a half now, and he continues to have this issue. Has yet to follow up with his physician from the [place]. States that today while sitting down and having lunch he came very dizzy and lightheaded. Was witnessed to have a syncopal episode. EMS was called and patient had systolic blood pressures in the 70s and was pale. He was placed in Trendelenburg position and given 500 mL of IV fluids and blood pressures improved to the low 65L systolic. He currently feels like he is back to baseline. Denies any chest pain, difficulty breathing, abdominal pain, vomiting, melena or hematochezia. Chronic diarrhea for several years, and it has been unchanged from prior. No other new medication changes. No fevers or chills, or recent illnesses.  Past Medical History:  Diagnosis Date  . Anemia   . Anxiety   . Arthritis    "touch in my hands" (01/12/2016)  . CAD (coronary artery disease)   . Chronic back pain    "upper and lower" (01/12/2016)  . Chronic bronchitis ([place])   . Chronic kidney disease    "I'm on a renal diet" (01/12/2016)  . COPD (chronic obstructive pulmonary disease) ([place])   . Depression   . GERD (gastroesophageal reflux disease)   . Heart murmur    "when I was little"  . History of fractured vertebra    "between shoulder blades"  . History of hiatal hernia   . 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  Hyperlipidemia   . Hypertension   . Hypothyroidism   . Multiple myeloma ([place])   . Myocardial infarction    "I've had 2" (01/12/2016)  . Seizures ([place]) 2017   "when my potassium got too high" (01/12/2016)  . Shingles   . Sleep apnea    "I don't have a mask right now" (01/12/2016)  . Stroke ([place])    "I've had 2; fingers stay colder" (pointes to left hand; 3rd, 4th, and 5th digits" (01/12/2016)  . Type II diabetes mellitus [facility])     Patient Active Problem List   Diagnosis Date Noted  . Syncope 03/19/2016  . Pre-syncope 01/10/2016  . AKI (acute kidney injury) ([place])   . Hypotension   . Near syncope   . Generalized weakness 01/01/2016  . Dehydration 01/01/2016  . Weakness generalized 01/01/2016  . Anemia   . Essential hypertension, malignant 10/25/2015  . CAD (coronary artery disease) 10/25/2015  . Hx of CABG 10/25/2015  . Metabolic acidosis 93/57/0177  . Chronic bilateral low back pain 10/25/2015  . Depression 10/25/2015  . Late effect of cerebrovascular accident (CVA) 10/25/2015  . Malnutrition of moderate degree 10/22/2015  . Acute renal failure (ARF) ([place]) 10/20/2015  . Hyperkalemia 10/20/2015  . Type II diabetes mellitus with peripheral circulatory disorder ([place]) 10/20/2015  . Hypertension 10/20/2015  . COPD (chronic obstructive pulmonary disease) ([place]) 10/20/2015  . Multiple myeloma ([place]) 10/20/2015  Past Surgical History:  Procedure Laterality Date  . CAROTID ARTERY ANGIOPLASTY Right    Right carotid artery "cleaned out"  . CORONARY ARTERY BYPASS GRAFT  2004   CABG X4"  . LAPAROSCOPIC CHOLECYSTECTOMY    . TONSILLECTOMY  ~ 1950       Home Medications    Prior to Admission medications   Medication Sig Start Date End Date Taking? Authorizing Provider  acyclovir (ZOVIRAX) 200 MG capsule Take 200 mg by mouth 2 (two) times daily.    Historical Provider, MD  albuterol (PROVENTIL HFA;VENTOLIN HFA) 108 (90 Base) MCG/ACT inhaler Inhale 2 puffs into the lungs 3  (three) times daily.     Historical Provider, MD  aspirin 325 MG tablet Take 325 mg by mouth daily with breakfast.     Historical Provider, MD  cloNIDine (CATAPRES) 0.1 MG tablet Take 1 tablet (0.1 mg total) by mouth at bedtime. 10/23/15   Florencia Reasons, MD  fluticasone (FLONASE) 50 MCG/ACT nasal spray Place 2 sprays into both nostrils daily. Patient taking differently: Place 2 sprays into both nostrils daily as needed for allergies.  01/07/16   [place], DO  gabapentin (NEURONTIN) 600 MG tablet Take 600 mg by mouth 2 (two) times daily.    Historical Provider, MD  guaiFENesin-dextromethorphan (ROBITUSSIN DM) 100-10 MG/5ML syrup Take 5 mLs by mouth every 4 (four) hours as needed for cough. Patient taking differently: Take 10 mLs by mouth every 4 (four) hours as needed for cough.  01/06/16   [place], DO  insulin aspart (NOVOLOG) 100 UNIT/ML injection Inject 0-10 Units into the skin 3 (three) times daily with meals. Inject as per sliding scale: 0 - 139=  0 units;  140 - 199 = 2 units;  200 - 250= 4 units;  251 - 299= 6 units;  300 - 349= 8 units;  350 - 450= 10 units  Call MD for CBG less than 60 or greater than 450    Historical Provider, MD  levETIRAcetam (KEPPRA XR) 500 MG 24 hr tablet Take 1 tablet (500 mg total) by mouth daily. Patient not taking: Reported on 03/14/2016 01/12/16   Nita Sells, MD  levothyroxine (SYNTHROID) 50 MCG tablet Take 1 tablet (50 mcg total) by mouth daily before breakfast. 10/23/15   Florencia Reasons, MD  lisinopril (PRINIVIL,ZESTRIL) 5 MG tablet Take 1 tablet (5 mg total) by mouth daily. 03/17/16   Leo Grosser, MD  Magnesium Oxide 420 MG TABS Take 840 mg by mouth 2 (two) times daily.    Historical Provider, MD  Multiple Vitamins-Minerals (CENTRUM SILVER ADULT 50+) TABS Take 1 tablet by mouth daily.    Historical Provider, MD  naproxen sodium (ANAPROX) 220 MG tablet Take 220 mg by mouth every 12 (twelve) hours as needed (for pain).    Historical Provider, MD    polyethylene glycol (MIRALAX / GLYCOLAX) packet Take 17 g by mouth daily after breakfast.     Historical Provider, MD  sertraline (ZOLOFT) 50 MG tablet Take 50 mg by mouth daily.    Historical Provider, MD  sodium bicarbonate 650 MG tablet Take 1 tablet (650 mg total) by mouth daily. Patient not taking: Reported on 03/14/2016 10/24/15   Florencia Reasons, MD  tiZANidine (ZANAFLEX) 4 MG tablet Take 4 mg by mouth every 8 (eight) hours.     Historical Provider, MD    Family History Family History  Problem Relation Age of Onset  . Diabetes Mother   . Cancer Neg Hx   .  Stroke Neg Hx     Social History Social History  Substance Use Topics  . Smoking status: Former Smoker    Packs/day: 2.00    Years: 35.00    Types: Cigarettes    Quit date: 04/08/1988  . Smokeless tobacco: Never Used  . Alcohol use No     Allergies   Amoxapine and related; Amoxicillin; and Augmentin [amoxicillin-pot clavulanate]   Review of Systems Review of Systems 10/14 systems reviewed and are negative other than those stated in the HPI   Physical Exam Updated Vital Signs BP (!) 139/53   Pulse (!) 45   Temp 97.7 F (36.5 C) (Oral)   Resp 16   Ht 5' 5"  (1.651 m)   Wt 150 lb (68 kg)   SpO2 100%   BMI 24.96 kg/m   Physical Exam Physical Exam  Nursing note and vitals reviewed. Constitutional: Well developed, well nourished, non-toxic, and in no acute distress Head: Normocephalic and atraumatic.  Mouth/Throat: Oropharynx is clear and moist.  Neck: Normal range of motion. Neck supple.  Cardiovascular: Normal rate and regular rhythm.   Pulmonary/Chest: Effort normal and breath sounds normal.  Abdominal: Soft. There is no tenderness. There is no rebound and no guarding.  Musculoskeletal: Normal range of motion.  Neurological: Alert, no facial droop, fluent speech, moves all extremities symmetrically, sensation to light touch intact throughout Skin: Skin is warm and dry.  Psychiatric: Cooperative   ED  Treatments / Results  Labs (all labs ordered are listed, but only abnormal results are displayed) Labs Reviewed  CBC WITH DIFFERENTIAL/PLATELET - Abnormal; Notable for the following:       Result Value   RBC 3.37 (*)    Hemoglobin 9.2 (*)    HCT 27.7 (*)    RDW 17.5 (*)    Platelets 72 (*)    All other components within normal limits  COMPREHENSIVE METABOLIC PANEL - Abnormal; Notable for the following:    Glucose, Bld 155 (*)    BUN 22 (*)    Creatinine, Ser 1.28 (*)    Total Protein 6.3 (*)    ALT 16 (*)    GFR calc non Af Amer 54 (*)    All other components within normal limits  TSH - Abnormal; Notable for the following:    TSH 5.763 (*)    All other components within normal limits  CORTISOL  I-STAT TROPOININ, ED    EKG  EKG Interpretation  Date/Time:  Tuesday March 19 2016 13:49:40 EST Ventricular Rate:  52 PR Interval:  150 QRS Duration: 88 QT Interval:  482 QTC Calculation: 448 R Axis:   10 Text Interpretation:  Sinus bradycardia T wave abnormality, consider inferolateral ischemia Abnormal ECG new inferolateral ischemia  Confirmed by Suhaib Guzzo MD, Hinton Dyer ([id number]) on 03/19/2016 2:20:25 PM Also confirmed by Darlene Bartelt MD, Alexandr Oehler [phone]), editor [place], Leda Gauze [phone])  on 03/19/2016 3:21:52 PM       Radiology No results found.  Procedures Procedures (including critical care time)  Medications Ordered in ED Medications  sodium chloride 0.9 % bolus 1,000 mL (1,000 mLs Intravenous New Bag/Given 03/19/16 1606)     Initial Impression / Assessment and Plan / ED Course  I have reviewed the triage vital signs and the nursing notes.  Pertinent labs & imaging results that were available during my care of the patient were reviewed by me and considered in my medical decision making (see chart for details).     71 year old male with history of  HTN, HLD, CAD, CKD, and DM who presents with syncope. On chart review, he has had hypotension and syncope multiple times over past few months,  including two admission in 12/2016. He has had reassuring ECHO, showing grade 1 diastolic dysfunction and normal systolic EF and only mild MR. His blood pressure medications slowly tapered off, and now still have orthostatic hypotension despite being off medications.   Blood pressure soft on arrival, but normalized after IVF. With chronic renal insufficiency. No other major electrolyte or metabolic derangements. No further orthostasis after fluids.   3rd ED visit this week and has been off of BP medications for 1.5 weeks. May need more observation in hospital and question new for new medical or nonmedical interventions. Discussed with Dr. Posey Pronto who will admit to hospitalist service for observation.  Final Clinical Impressions(s) / ED Diagnoses   Final diagnoses:  Syncope and collapse  Orthostatic hypotension    New Prescriptions New Prescriptions   No medications on file     Forde Dandy, MD 03/19/16 1730

## 2016-03-20 DIAGNOSIS — R55 Syncope and collapse: Secondary | ICD-10-CM | POA: Diagnosis not present

## 2016-03-20 DIAGNOSIS — E1151 Type 2 diabetes mellitus with diabetic peripheral angiopathy without gangrene: Secondary | ICD-10-CM

## 2016-03-20 DIAGNOSIS — I10 Essential (primary) hypertension: Secondary | ICD-10-CM

## 2016-03-20 LAB — COMPREHENSIVE METABOLIC PANEL
ALBUMIN: 3.2 g/dL — AB (ref 3.5–5.0)
ALT: 16 U/L — ABNORMAL LOW (ref 17–63)
ANION GAP: 9 (ref 5–15)
AST: 17 U/L (ref 15–41)
Alkaline Phosphatase: 54 U/L (ref 38–126)
BUN: 19 mg/dL (ref 6–20)
CHLORIDE: 104 mmol/L (ref 101–111)
CO2: 22 mmol/L (ref 22–32)
Calcium: 9.1 mg/dL (ref 8.9–10.3)
Creatinine, Ser: 1.24 mg/dL (ref 0.61–1.24)
GFR calc Af Amer: >60 mL/min (ref >60)
GFR calc non Af Amer: 56 mL/min — ABNORMAL LOW (ref >60)
GLUCOSE: 192 mg/dL — AB (ref 65–99)
POTASSIUM: 4.4 mmol/L (ref 3.5–5.1)
SODIUM: 135 mmol/L (ref 135–145)
Total Bilirubin: 0.8 mg/dL (ref 0.3–1.2)
Total Protein: 6 g/dL — ABNORMAL LOW (ref 6.5–8.1)

## 2016-03-20 LAB — CBC
HEMATOCRIT: 27.4 % — AB (ref 39.0–52.0)
HEMOGLOBIN: 9 g/dL — AB (ref 13.0–17.0)
MCH: 27 pg (ref 26.0–34.0)
MCHC: 32.8 g/dL (ref 30.0–36.0)
MCV: 82.3 fL (ref 78.0–100.0)
Platelets: 64 10*3/uL — ABNORMAL LOW (ref 150–400)
RBC: 3.33 MIL/uL — ABNORMAL LOW (ref 4.22–5.81)
RDW: 17.5 % — AB (ref 11.5–15.5)
WBC: 3.4 10*3/uL — ABNORMAL LOW (ref 4.0–10.5)

## 2016-03-20 LAB — GLUCOSE, CAPILLARY
GLUCOSE-CAPILLARY: 203 mg/dL — AB (ref 65–99)
GLUCOSE-CAPILLARY: 284 mg/dL — AB (ref 65–99)

## 2016-03-20 MED ORDER — AMLODIPINE BESYLATE 5 MG PO TABS: 5.0000 mg | ORAL_TABLET | Freq: Every day | ORAL | 0 refills | Status: DC

## 2016-03-20 NOTE — Care Management (Addendum)
Case Management Note Initial Note Started By: Jonnie Finner Patient Details  Name: Douglas Mccoy MRN: [id number] Date of Birth: 1945/11/07  Subjective/Objective:    syncope                Action/Plan: Discharge Planning: NCM spoke to pt and states he goes to [place]. He has RW and cane at home. He currently lives in the [place] waiting [place] housing. Will need taxi voucher at [place]. Will consult with CSW for transportation. Pt states [facility] to his [facility] appts. Pt does not have a neb machine.   PCP [facility]  Expected Discharge Date:                         Expected Discharge Plan:  Home/Self Care  In-House Referral:  NA  Discharge planning Services  CM Consult  Post Acute Care Choice:  NA Choice offered to:  NA  DME Arranged:  N/A DME Agency:  NA  HH Arranged:  NA HH Agency:  NA  Status of Service:  Completed, signed off  If discussed at [place] of Stay Meetings, dates discussed:    Additional Comments  1650 43-7-18 Jacqlyn Krauss, RN,BSN [phone] CM was able to reach out to [place] and she reached out to the [place]. Beckie [employer] to provide transportation to the appointment on Friday. MD Vann aware. No further needs from CM at this time.    1523 03-20-16 Jacqlyn Krauss, RN,BSN [phone] CM did call the [facility] as well to see if patient could get transportation to Dr. Renea Ee Office at the [facility]. Voicemail was left for pick up for patient. CM did reach out to the [place]. She was able to give me alternative numbers for volunteers. CM did call however no guarantee that they could assist. CM will make patient aware to call tomorrow as well.     1038 03-20-16 Jacqlyn Krauss, RN,BSN [phone] CM did reach out to [facility] in regards to scheduling an appointment for a 30 day event monitor. PCP is Dr.  Mal[facility]. CM did schedule an appointment for 03-22-16 @ 3pm for MD to evaluate for 30 day event monitor. Patient and MD Eliseo Squires aware of scheduled appointment. Pt in need of transportation. CM did call the [place] at [phone] ext [phone]. CM did call [facility] and voice mail left at 11:00 am. Awaiting call back to schedule transportation. CM to fax d/c summary and any new medications to Dr.Omeara at [phone]. CSW is aware that pt is from the [facility] and will need a taxi to return. No further needs from CM at this time.

## 2016-03-20 NOTE — Care Management Note (Signed)
Case Management Note  Patient Details  Name: Douglas Mccoy MRN: [id number] Date of Birth: Apr 05, 1945  Subjective/Objective:    syncope                Action/Plan: Discharge Planning: NCM spoke to pt and states he goes to [place]. He has RW and cane at home. He currently lives in the [place] waiting [employer] housing. Will need taxi voucher at [place]. Will consult with CSW for transportation. Pt states [facility] to his [facility] appts. Pt does not have a neb machine.   PCP [facility]  Expected Discharge Date:                  Expected Discharge Plan:  Home/Self Care  In-House Referral:  NA  Discharge planning Services  CM Consult  Post Acute Care Choice:  NA Choice offered to:  NA  DME Arranged:  N/A DME Agency:  NA  HH Arranged:  NA HH Agency:  NA  Status of Service:  Completed, signed off  If discussed at [place] of Stay Meetings, dates discussed:    Additional Comments:  Erenest Rasher, RN 03/20/2016, 10:20 AM

## 2016-03-20 NOTE — Discharge Instructions (Signed)
Syncope Syncope is when you lose temporarily pass out (faint). Signs that you may be about to pass out include:  Feeling dizzy or light-headed.  Feeling sick to your stomach (nauseous).  Seeing all white or all black.  Having cold, clammy skin. If you passed out, get help right away. Call your local emergency services (911 in the [place] Do not drive yourself to the hospital. Follow these instructions at home: Pay attention to any changes in your symptoms. Take these actions to help with your condition:  Have someone stay with you until you feel stable.  Do not drive, use machinery, or play sports until your doctor says it is okay.  Keep all follow-up visits as told by your doctor. This is important.  If you start to feel like you might pass out, lie down right away and raise (elevate) your feet above the level of your heart. Breathe deeply and steadily. Wait until all of the symptoms are gone.  Drink enough fluid to keep your pee (urine) clear or pale yellow.  If you are taking blood pressure or heart medicine, get up slowly and spend many minutes getting ready to sit and then stand. This can help with dizziness.  Take over-the-counter and prescription medicines only as told by your doctor. Get help right away if:  You have a very bad headache.  You have unusual pain in your chest, tummy, or back.  You are bleeding from your mouth or rectum.  You have black or tarry poop (stool).  You have a very fast or uneven heartbeat (palpitations).  It hurts to breathe.  You pass out once or more than once.  You have jerky movements that you cannot control (seizure).  You are confused.  You have trouble walking.  You are very weak.  You have vision problems. These symptoms may be an emergency. Do not wait to see if the symptoms will go away. Get medical help right away. Call your local emergency services (911 in the [place] Do not drive yourself to the hospital. This  information is not intended to replace advice given to you by your health care provider. Make sure you discuss any questions you have with your health care provider. Document Released: 06/19/2007 Document Revised: 06/08/2015 Document Reviewed: 09/14/2014 Elsevier Interactive Patient Education  2017 [place] Heart-healthy meal planning includes:  Limiting unhealthy fats.  Increasing healthy fats.  Making other small dietary changes. You may need to talk with your doctor or a diet specialist (dietitian) to create an eating plan that is right for you. What types of fat should I choose?  Choose healthy fats. These include olive oil and canola oil, flaxseeds, walnuts, almonds, and seeds.  Eat more omega-3 fats. These include salmon, mackerel, sardines, tuna, flaxseed oil, and ground flaxseeds. Try to eat fish at least twice each week.  Limit saturated fats.  Saturated fats are often found in animal products, such as meats, butter, and cream.  Plant sources of saturated fats include palm oil, palm kernel oil, and coconut oil.  Avoid foods with partially hydrogenated oils in them. These include stick margarine, some tub margarines, cookies, crackers, and other baked goods. These contain trans fats. What general guidelines do I need to follow?  Check food labels carefully. Identify foods with trans fats or high amounts of saturated fat.  Fill one half of your plate with vegetables and green salads. Eat 4-5 servings of vegetables per day. A serving of  vegetables is:  1 cup of raw leafy vegetables.   cup of raw or cooked cut-up vegetables.   cup of vegetable juice.  Fill one fourth of your plate with whole grains. Look for the word "whole" as the first word in the ingredient list.  Fill one fourth of your plate with lean protein foods.  Eat 4-5 servings of fruit per day. A serving of fruit is:  One medium whole fruit.   cup of dried  fruit.   cup of fresh, frozen, or canned fruit.   cup of 100% fruit juice.  Eat more foods that contain soluble fiber. These include apples, broccoli, carrots, beans, peas, and barley. Try to get 20-30 g of fiber per day.  Eat more home-cooked food. Eat less restaurant, buffet, and fast food.  Limit or avoid alcohol.  Limit foods high in starch and sugar.  Avoid fried foods.  Avoid frying your food. Try baking, boiling, grilling, or broiling it instead. You can also reduce fat by:  Removing the skin from poultry.  Removing all visible fats from meats.  Skimming the fat off of stews, soups, and gravies before serving them.  Steaming vegetables in water or broth.  Lose weight if you are overweight.  Eat 4-5 servings of nuts, legumes, and seeds per week:  One serving of dried beans or legumes equals  cup after being cooked.  One serving of nuts equals 1 ounces.  One serving of seeds equals  ounce or one tablespoon.  You may need to keep track of how much salt or sodium you eat. This is especially true if you have high blood pressure. Talk with your doctor or dietitian to get more information. What foods can I eat? Grains  Breads, including [place], white, pita, wheat, raisin, rye, oatmeal, and [place]. Tortillas that are neither fried nor made with lard or trans fat. Low-fat rolls, including hotdog and hamburger buns and English muffins. Biscuits. Muffins. Waffles. Pancakes. Light popcorn. Whole-grain cereals. Flatbread. Melba toast. Pretzels. Breadsticks. Rusks. Low-fat snacks. Low-fat crackers, including oyster, saltine, matzo, graham, animal, and rye. Rice and pasta, including brown rice and pastas that are made with whole wheat. Vegetables  All vegetables. Fruits  All fruits, but limit coconut. Meats and Other Protein Sources  Lean, well-trimmed beef, veal, pork, and lamb. Chicken and [place] without skin. All fish and shellfish. Wild duck, rabbit, pheasant, and  venison. Egg whites or low-cholesterol egg substitutes. Dried beans, peas, lentils, and tofu. Seeds and most nuts. Dairy  Low-fat or nonfat cheeses, including ricotta, string, and mozzarella. Skim or 1% milk that is liquid, powdered, or evaporated. Buttermilk that is made with low-fat milk. Nonfat or low-fat yogurt. Beverages  Mineral water. Diet carbonated beverages. Sweets and Desserts  Sherbets and fruit ices. Honey, jam, marmalade, jelly, and syrups. Meringues and gelatins. Pure sugar candy, such as hard candy, jelly beans, gumdrops, mints, marshmallows, and small amounts of dark chocolate. [employer] Eat all sweets and desserts in moderation. Fats and Oils  Nonhydrogenated (trans-free) margarines. Vegetable oils, including soybean, sesame, sunflower, olive, peanut, safflower, corn, canola, and cottonseed. Salad dressings or mayonnaise made with a vegetable oil. Limit added fats and oils that you use for cooking, baking, salads, and as spreads. Other  Cocoa powder. Coffee and tea. All seasonings and condiments. The items listed above may not be a complete list of recommended foods or beverages. Contact your dietitian for more options.  What foods are not recommended? Grains  Breads that are made  with saturated or trans fats, oils, or whole milk. Croissants. Butter rolls. Cheese breads. Sweet rolls. Donuts. Buttered popcorn. Chow mein noodles. High-fat crackers, such as cheese or butter crackers. Meats and Other Protein Sources  Fatty meats, such as hotdogs, short ribs, sausage, spareribs, bacon, rib eye roast or steak, and mutton. High-fat deli meats, such as salami and bologna. Caviar. Domestic duck and goose. Organ meats, such as kidney, liver, sweetbreads, and heart. Dairy  Cream, sour cream, cream cheese, and creamed cottage cheese. Whole-milk cheeses, including blue (bleu), Monterey Jack, [place], [place], American, [place], Swiss, cheddar, [place], and [place]. Whole or 2% milk that  is liquid, evaporated, or condensed. Whole buttermilk. Cream sauce or high-fat cheese sauce. Yogurt that is made from whole milk. Beverages  Regular sodas and juice drinks with added sugar. Sweets and Desserts  Frosting. Pudding. Cookies. Cakes other than angel food cake. Candy that has milk chocolate or white chocolate, hydrogenated fat, butter, coconut, or unknown ingredients. Buttered syrups. Full-fat ice cream or ice cream drinks. Fats and Oils  Gravy that has suet, meat fat, or shortening. Cocoa butter, hydrogenated oils, palm oil, coconut oil, palm kernel oil. These can often be found in baked products, candy, fried foods, nondairy creamers, and whipped toppings. Solid fats and shortenings, including bacon fat, salt pork, lard, and butter. Nondairy cream substitutes, such as coffee creamers and sour cream substitutes. Salad dressings that are made of unknown oils, cheese, or sour cream. The items listed above may not be a complete list of foods and beverages to avoid. Contact your dietitian for more information.  This information is not intended to replace advice given to you by your health care provider. Make sure you discuss any questions you have with your health care provider. Document Released: 07/02/2011 Document Revised: 06/08/2015 Document Reviewed: 06/24/2013 Elsevier Interactive Patient Education  2017 [employer].

## 2016-03-20 NOTE — Progress Notes (Signed)
PROGRESS NOTE    Douglas Mccoy  SFK:[id number] DOB: 29-Mar-1945 DOA: 03/19/2016 PCP: [facility]   Outpatient Specialists:     Brief Narrative:  Douglas Mccoy is a 71 y.o. male with Past medical history of Type 2 diabetes mellitus, neuropathy, HTN, PVD, COPD, CAD S/P CABG, multiple myeloma in remission, recurrent syncope. Patient was feeling fine in the AM and then while he was eating his lunch, he suddenly passed out and did not remember anything beyond that.    Assessment & Plan:   Principal Problem:   Syncope Active Problems:   Type II diabetes mellitus with peripheral circulatory disorder (HCC)   Hypertension   COPD (chronic obstructive pulmonary disease) (HCC)   CAD (coronary artery disease)   Hx of CABG   Syncope Patient presents with recurrent episodes of syncope. This is his third visit in the ER within last 2 weeks with syncope or near syncope Patient has not been taking his blood pressure medication and he does not have any prodrome or aura before this. Multiple etiology of patient's syncope and presyncope- polypharmacy, gabapentin, antihypertensive medication, Keppra, Zanaflex, Zoloft all can cause dizziness; cardiac arrhythmia with AV block would be the most concerning cause, autonomic dysfunction due to diabetes in a patient already has PVD as well as neuropathy would be another differential. Recently had an echocardiogram which was unremarkable for any valvular abnormality, EF was normal. 30 day event monitor for the patient-  Care management made appointment for patient at [facility]-- 03/23/15 at 3pm for event monitor TED hose   Essential hypertension. Blood pressure currently elevated but has multiple episodes of low blood pressure. Was on clonidine and lisinopril and multiple other antihypertensive medications. - initiate low-dose Norvasc, discontinue all other antihypertensive medications. -Continue ted stocking. -orthos negative  Type 2 diabetes  mellitus. Currently on sliding scale insulin - hemoglobin A1c pending  Coronary artery disease S/P CABG. Continue aspirin.   Multiple myeloma with Chronic anemia, chronic thrombocytopenia S/P bone marrow transplant at [facility]. Currently in remission followed at the [place]. On acyclovir for herpes suppression. Not on any medication. Renal function stable, hemoglobin stable, platelets mildly low would likely fluctuate. Monitor.  Hypothyroidism -synthroid   DVT prophylaxis:  SCD's  Code Status: Full Code   Family Communication:   Disposition Plan:  Home this PM-- home O2 study pending   Consultants:      Subjective: Feeling well this PM  Objective: Vitals:   03/19/16 2201 03/20/16 0540 03/20/16 0910 03/20/16 0942  BP:  (!) 112/58    Pulse:  62    Resp:  16    Temp:  98.8 F (37.1 C)    TempSrc:  Oral    SpO2: 99% 100% 99% 97%  Weight:  69.8 kg (153 lb 12.8 oz)    Height:        Intake/Output Summary (Last 24 hours) at 03/20/16 1048 Last data filed at 03/20/16 0800  Gross per 24 hour  Intake             1180 ml  Output             1150 ml  Net               30 ml   Filed Weights   03/19/16 1351 03/19/16 2100 03/20/16 0540  Weight: 68 kg (150 lb) 70.4 kg (155 lb 1.6 oz) 69.8 kg (153 lb 12.8 oz)    Examination:  General exam: Appears calm and comfortable  Respiratory system: Clear  to auscultation. Respiratory effort normal. Cardiovascular system: S1 & S2 heard, RRR. No JVD, murmurs, rubs, gallops or clicks. No pedal edema. Gastrointestinal system: Abdomen is nondistended, soft and nontender. No organomegaly or masses felt. Normal bowel sounds heard. Central nervous system: Alert and oriented. No focal neurological deficits.     Data Reviewed: I have personally reviewed following labs and imaging studies  CBC:  Recent Labs Lab 03/14/16 1501 03/17/16 1504 03/19/16 1550 03/20/16 0441  WBC 7.2  --  4.1 3.4*  NEUTROABS  --   --  2.5  --    HGB 10.1* 9.5* 9.2* 9.0*  HCT 30.9* 28.0* 27.7* 27.4*  MCV 80.7  --  82.2 82.3  PLT 103*  --  72* 64*   Basic Metabolic Panel:  Recent Labs Lab 03/14/16 1501 03/17/16 1504 03/19/16 1550 03/20/16 0441  NA 135 135 136 135  K 5.3* 5.5* 4.8 4.4  CL 105 102 106 104  CO2 23  --  22 22  GLUCOSE 358* 374* 155* 192*  BUN 35* 33* 22* 19  CREATININE 1.47* 1.40* 1.28* 1.24  CALCIUM 9.1  --  9.0 9.1  MG 1.7  --   --   --    GFR: Estimated Creatinine Clearance: 46.8 mL/min (by C-G formula based on SCr of 1.24 mg/dL). Liver Function Tests:  Recent Labs Lab 03/14/16 1501 03/19/16 1550 03/20/16 0441  AST _0 ALT 15* 16* 16*  ALKPHOS 60 57 54  BILITOT 1.3* 1.2 0.8  PROT 7.3 6.3* 6.0*  ALBUMIN 3.9 3.5 3.2*   No results for input(s): LIPASE, AMYLASE in the last 168 hours. No results for input(s): AMMONIA in the last 168 hours. Coagulation Profile: No results for input(s): INR, PROTIME in the last 168 hours. Cardiac Enzymes: No results for input(s): CKTOTAL, CKMB, CKMBINDEX, TROPONINI in the last 168 hours. BNP (last 3 results) No results for input(s): PROBNP in the last 8760 hours. HbA1C: No results for input(s): HGBA1C in the last 72 hours. CBG:  Recent Labs Lab 03/19/16 2145 03/20/16 0818  GLUCAP 327* 203*   Lipid Profile: No results for input(s): CHOL, HDL, LDLCALC, TRIG, CHOLHDL, LDLDIRECT in the last 72 hours. Thyroid Function Tests:  Recent Labs  03/19/16 1550  TSH 5.763*   Anemia Panel: No results for input(s): VITAMINB12, FOLATE, FERRITIN, TIBC, IRON, RETICCTPCT in the last 72 hours. Urine analysis:    Component Value Date/Time   COLORURINE STRAW (A) 03/14/2016 1427   APPEARANCEUR CLEAR 03/14/2016 1427   LABSPEC 1.014 03/14/2016 1427   PHURINE 7.0 03/14/2016 1427   GLUCOSEU >=500 (A) 03/14/2016 1427   HGBUR NEGATIVE 03/14/2016 1427   BILIRUBINUR NEGATIVE 03/14/2016 1427   KETONESUR NEGATIVE 03/14/2016 1427   PROTEINUR NEGATIVE 03/14/2016 1427    NITRITE NEGATIVE 03/14/2016 1427   LEUKOCYTESUR NEGATIVE 03/14/2016 1427     )No results found for this or any previous visit (from the past 240 hour(s)).    Anti-infectives    Start     Dose/Rate Route Frequency Ordered Stop   03/19/16 2200  acyclovir (ZOVIRAX) 200 MG capsule 200 mg     200 mg Oral 2 times daily 03/19/16 1813         Radiology Studies: X-ray Chest Pa And Lateral  Result Date: 03/19/2016 CLINICAL DATA:  Respiratory crackles, pneumonia 2 weeks ago. Hypotensive. History of COPD, CABG, hypertension. EXAM: CHEST  2 VIEW COMPARISON:  Chest radiograph March 14, 2016 FINDINGS: Cardiac silhouette is upper limits normal size, calcified tortuous aorta.  Status post median sternotomy for CABG with fractured midsternal heroin. Chronically elevated RIGHT hemidiaphragm. No pleural effusion or focal consolidation. No pneumothorax. Soft tissue planes included osseous structures are nonsuspicious. Single level mid thoracic vertebral body cement augmentation. Mild old approximate T11 compression fracture. Surgical clips in the included right abdomen compatible with cholecystectomy. IMPRESSION: Stable examination: Borderline cardiomegaly, no acute pulmonary process. Electronically Signed   By: Elon Alas M.D.   On: 03/19/2016 19:20        Scheduled Meds: . acyclovir  200 mg Oral BID  . albuterol  2.5 mg Inhalation TID  . amLODipine  2.5 mg Oral Daily  . aspirin EC  81 mg Oral Daily  . gabapentin  600 mg Oral BID  . insulin aspart  0-15 Units Subcutaneous TID WC  . insulin aspart  0-5 Units Subcutaneous QHS  . levETIRAcetam  500 mg Oral Daily  . levothyroxine  50 mcg Oral QAC breakfast  . sertraline  50 mg Oral Daily  . sodium chloride flush  3 mL Intravenous Q12H   Continuous Infusions:   LOS: 0 days    Time spent: 25 min    [place], DO [facility] Pager [phone]  If 7PM-7AM, please contact night-coverage [email or web address] Password  [facility] 03/20/2016, 10:48 AM

## 2016-03-20 NOTE — Discharge Summary (Signed)
Physician Discharge Summary  Douglas Mccoy QIO:[id number] DOB: [birth year] DOA: 03/19/2016  PCP: [place] date: 03/19/2016 Discharge date: 03/20/2016   Recommendations for Outpatient Follow-Up:   1. BP medications have been adjusted 2. Needs 30 day outpatient event monitor 3. HgbA1C pending 4. Cbc, bmp 1 week 5. Repeat TSH as outpatient   Discharge Diagnosis:   Principal Problem:   Syncope Active Problems:   Type II diabetes mellitus with peripheral circulatory disorder (HCC)   Hypertension   COPD (chronic obstructive pulmonary disease) (HCC)   CAD (coronary artery disease)   Hx of CABG   Discharge disposition:  Home.  Discharge Condition: Improved.  Diet recommendation: Low sodium, heart healthy.  Wound care: None.   History of Present Illness:   Douglas Mccoy is a 71 y.o. male with Past medical history of Type 2 diabetes mellitus, neuropathy, HTN, PVD, COPD, CAD S/P CABG, multiple myeloma in remission, recurrent syncope. Patient presented with complaints of episode of syncope. Patient was feeling better when he woke up this morning and did not have any acute complaint, while he was eating his lunch suddenly passed out and did not remember anything beyond that. Denies any dizziness lightheadedness chest pain palpitation before or after the event. No nausea no vomiting.   He mentions he has chronic diarrhea. He has not been taking his clonidine and lisinopril for last 1-1/2 week as per provider. No burning urination no fever no chills no cough   Hospital Course by Problem:   Syncope Patient presents with recurrent episodes of syncope. This is his third visit in the ER within last 2 weeks with syncope or near syncope Patient has not been taking his blood pressure medication and he does not have any prodrome or aura before this. Multiple etiology of patient's syncope and presyncope- polypharmacy, gabapentin, antihypertensive medication, Keppra, Zanaflex,  Zoloft all can cause dizziness; cardiac arrhythmia with AV block would be the most concerning cause, autonomic dysfunction due to diabetes in a patient already has PVD as well as neuropathy would be another differential. Recently had an echocardiogram which was unremarkable for any valvular abnormality, EF was normal. 30 day event monitor for the patient-  Care management made appointment for patient at [facility]-- 03/23/15 at 3pm for event monitor TED hose   Essential hypertension. Blood pressure currently elevated but has multiple episodes of low blood pressure. Was on clonidine and lisinopril and multiple other antihypertensive medications. - initiate low-dose Norvasc, discontinue all other antihypertensive medications. -Continue ted stocking. -orthos negative  Type 2 diabetes mellitus. Currently on sliding scale insulin - hemoglobin A1c pending- will need further medication adjustments once back  Coronary artery disease S/P CABG. Continue aspirin.  Multiple myeloma with Chronic anemia, chronic thrombocytopenia S/P bone marrow transplant at [facility]. Currently in remission followed at the [place]. On acyclovir for herpes suppression. Renal function stable, hemoglobin stable, platelets mildly low would likely fluctuate. Monitor as outpatient.  Hypothyroidism -synthroid TSH outpatient in 2-3 weeks   12/17 echo: Study Conclusions  - Left ventricle: The cavity size was normal. Wall thickness was   increased in a pattern of mild LVH. The estimated ejection   fraction was 55%. Wall motion was normal; there were no regional   wall motion abnormalities. Doppler parameters are consistent with   abnormal left ventricular relaxation (grade 1 diastolic   dysfunction). - Aortic valve: Trileaflet; moderately calcified leaflets.   Sclerosis without stenosis. There was trivial regurgitation. Mean   gradient (S): 8 mm Hg. - Mitral  valve: Moderately calcified annulus. There was mild    regurgitation. - Left atrium: The atrium was mildly dilated. - Right ventricle: The cavity size was normal. Systolic function   was normal. - Pulmonary arteries: PA peak pressure: 19 mm Hg (S). - Inferior vena cava: The vessel was normal in size. The   respirophasic diameter changes were in the normal range (>= 50%),   consistent with normal central venous pressure.  Impressions:  - Normal LV size with mild LV hypertrophy. EF 55%. Normal RV size   and systolic function. Aortic sclerosis without significant   stenosis. Mild mitral regurgitation.  Medical Consultants:    None.   Discharge Exam:   Vitals:   03/20/16 0540 03/20/16 1332  BP: (!) 112/58 (!) 155/60  Pulse: 62 81  Resp: 16 20  Temp: 98.8 F (37.1 C) 98.4 F (36.9 C)   Vitals:   03/20/16 0540 03/20/16 0910 03/20/16 0942 03/20/16 1332  BP: (!) 112/58   (!) 155/60  Pulse: 62   81  Resp: 16   20  Temp: 98.8 F (37.1 C)   98.4 F (36.9 C)  TempSrc: Oral   Oral  SpO2: 100% 99% 97% 100%  Weight: 69.8 kg (153 lb 12.8 oz)     Height:        Gen:  NAD   The results of significant diagnostics from this hospitalization (including imaging, microbiology, ancillary and laboratory) are listed below for reference.     Procedures and Diagnostic Studies:   X-ray Chest Pa And Lateral  Result Date: 03/19/2016 CLINICAL DATA:  Respiratory crackles, pneumonia 2 weeks ago. Hypotensive. History of COPD, CABG, hypertension. EXAM: CHEST  2 VIEW COMPARISON:  Chest radiograph March 14, 2016 FINDINGS: Cardiac silhouette is upper limits normal size, calcified tortuous aorta. Status post median sternotomy for CABG with fractured midsternal heroin. Chronically elevated RIGHT hemidiaphragm. No pleural effusion or focal consolidation. No pneumothorax. Soft tissue planes included osseous structures are nonsuspicious. Single level mid thoracic vertebral body cement augmentation. Mild old approximate T11 compression fracture. Surgical clips  in the included right abdomen compatible with cholecystectomy. IMPRESSION: Stable examination: Borderline cardiomegaly, no acute pulmonary process. Electronically Signed   By: Elon Alas M.D.   On: 03/19/2016 19:20     Labs:   Basic Metabolic Panel:  Recent Labs Lab 03/14/16 1501 03/17/16 1504 03/19/16 1550 03/20/16 0441  NA 135 135 136 135  K 5.3* 5.5* 4.8 4.4  CL 105 102 106 104  CO2 23  --  22 22  GLUCOSE 358* 374* 155* 192*  BUN 35* 33* 22* 19  CREATININE 1.47* 1.40* 1.28* 1.24  CALCIUM 9.1  --  9.0 9.1  MG 1.7  --   --   --    GFR Estimated Creatinine Clearance: 46.8 mL/min (by C-G formula based on SCr of 1.24 mg/dL). Liver Function Tests:  Recent Labs Lab 03/14/16 1501 03/19/16 1550 03/20/16 0441  AST 16 19 17   ALT 15* 16* 16*  ALKPHOS 60 57 54  BILITOT 1.3* 1.2 0.8  PROT 7.3 6.3* 6.0*  ALBUMIN 3.9 3.5 3.2*   No results for input(s): LIPASE, AMYLASE in the last 168 hours. No results for input(s): AMMONIA in the last 168 hours. Coagulation profile No results for input(s): INR, PROTIME in the last 168 hours.  CBC:  Recent Labs Lab 03/14/16 1501 03/17/16 1504 03/19/16 1550 03/20/16 0441  WBC 7.2  --  4.1 3.4*  NEUTROABS  --   --  2.5  --  HGB 10.1* 9.5* 9.2* 9.0*  HCT 30.9* 28.0* 27.7* 27.4*  MCV 80.7  --  82.2 82.3  PLT 103*  --  72* 64*   Cardiac Enzymes: No results for input(s): CKTOTAL, CKMB, CKMBINDEX, TROPONINI in the last 168 hours. BNP: Invalid input(s): POCBNP CBG:  Recent Labs Lab 03/19/16 2145 03/20/16 0818 03/20/16 1110  GLUCAP 327* 203* 284*   D-Dimer No results for input(s): DDIMER in the last 72 hours. Hgb A1c No results for input(s): HGBA1C in the last 72 hours. Lipid Profile No results for input(s): CHOL, HDL, LDLCALC, TRIG, CHOLHDL, LDLDIRECT in the last 72 hours. Thyroid function studies  Recent Labs  03/19/16 1550  TSH 5.763*   Anemia work up No results for input(s): VITAMINB12, FOLATE, FERRITIN,  TIBC, IRON, RETICCTPCT in the last 72 hours. Microbiology No results found for this or any previous visit (from the past 240 hour(s)).   Discharge Instructions:   Discharge Instructions    Diet - low sodium heart healthy    Complete by:  As directed    Discharge instructions    Complete by:  As directed    Cbc, bmp 1 week Appointment scheduled at the [facility]- 03/22/16 for cardiac event monitor   Increase activity slowly    Complete by:  As directed      Allergies as of 03/20/2016   No Active Allergies     Medication List    STOP taking these medications   guaiFENesin-dextromethorphan 100-10 MG/5ML syrup Commonly known as:  ROBITUSSIN DM   lisinopril 5 MG tablet Commonly known as:  PRINIVIL,ZESTRIL     TAKE these medications   acyclovir 200 MG capsule Commonly known as:  ZOVIRAX Take 200 mg by mouth 2 (two) times daily.   albuterol 108 (90 Base) MCG/ACT inhaler Commonly known as:  PROVENTIL HFA;VENTOLIN HFA Inhale 2 puffs into the lungs 3 (three) times daily.   amLODipine 5 MG tablet Commonly known as:  NORVASC Take 1 tablet (5 mg total) by mouth daily. Start taking on:  04/08/2016   aspirin EC 81 MG tablet Take 81 mg by mouth daily.   CENTRUM SILVER ADULT 50+ Tabs Take 1 tablet by mouth daily.   fluticasone 50 MCG/ACT nasal spray Commonly known as:  FLONASE Place 2 sprays into both nostrils daily. What changed:  when to take this  reasons to take this   gabapentin 600 MG tablet Commonly known as:  NEURONTIN Take 600 mg by mouth 2 (two) times daily.   guaiFENesin-codeine 100-10 MG/5ML syrup Commonly known as:  ROBITUSSIN AC Take 5 mLs by mouth daily as needed for cough.   insulin aspart 100 UNIT/ML injection Commonly known as:  novoLOG Inject 0-10 Units into the skin 3 (three) times daily with meals. Inject as per sliding scale: 0 - 139=  0 units;  140 - 199 = 2 units;  200 - 250= 4 units;  251 - 299= 6 units;  300 - 349= 8 units;  350 - 450= 10 units  Call  MD for CBG less than 60 or greater than 450   lenalidomide 15 MG capsule Commonly known as:  REVLIMID Take 15 mg by mouth daily.   levETIRAcetam 500 MG 24 hr tablet Commonly known as:  KEPPRA XR Take 1 tablet (500 mg total) by mouth daily.   levothyroxine 50 MCG tablet Commonly known as:  SYNTHROID Take 1 tablet (50 mcg total) by mouth daily before breakfast.   Magnesium Oxide 420 MG Tabs Take 840 mg by  mouth 2 (two) times daily.   naproxen sodium 220 MG tablet Commonly known as:  ANAPROX Take 220 mg by mouth every 12 (twelve) hours as needed (for pain).   polyethylene glycol packet Commonly known as:  MIRALAX / GLYCOLAX Take 17 g by mouth daily.   sertraline 50 MG tablet Commonly known as:  ZOLOFT Take 50 mg by mouth daily.   tiZANidine 4 MG tablet Commonly known as:  ZANAFLEX Take 4 mg by mouth 2 (two) times daily.      Follow-up Information    [place] Follow up on 03/22/2016.   Specialty:  General Practice Why:  @ 3:00 pm for hospital follow up with Dr. Harvest Dark for 30 day event monitor.  Contact information: [place] [id number] [phone]            Time coordinating discharge: 35 min  Signed:  [place]   [facility] 03/20/2016, 2:12 PM

## 2016-03-20 NOTE — Care Management Obs Status (Signed)
[facility] NOTIFICATION   Patient Details  Name: Henery Betzold MRN: [id number] Date of Birth: 20-Nov-1945   Medicare Observation Status Notification Given:  Yes    Erenest Rasher, RN 03/20/2016, 10:06 AM

## 2016-03-20 NOTE — Progress Notes (Signed)
SATURATION QUALIFICATIONS: (This note is used to comply with regulatory documentation for home oxygen)  Patient Saturations on Room Air at Rest = 98%  Patient Saturations on Room Air while Ambulating = 97%  Patient Saturations on NALiters of oxygen while Ambulating = NA%  Please briefly explain why patient needs home oxygen:  PATIENT DOES NOT [place] O2

## 2016-03-21 ENCOUNTER — Inpatient Hospital Stay (HOSPITAL_COMMUNITY)
Admission: EM | Admit: 2016-03-21 | Discharge: 2016-04-14 | DRG: 216 | Disposition: E | Payer: Medicare Other | Attending: Cardiothoracic Surgery | Admitting: Cardiothoracic Surgery

## 2016-03-21 ENCOUNTER — Encounter (HOSPITAL_COMMUNITY): Payer: Self-pay

## 2016-03-21 DIAGNOSIS — Z8673 Personal history of transient ischemic attack (TIA), and cerebral infarction without residual deficits: Secondary | ICD-10-CM

## 2016-03-21 DIAGNOSIS — D6959 Other secondary thrombocytopenia: Secondary | ICD-10-CM | POA: Diagnosis present

## 2016-03-21 DIAGNOSIS — E1122 Type 2 diabetes mellitus with diabetic chronic kidney disease: Secondary | ICD-10-CM | POA: Diagnosis present

## 2016-03-21 DIAGNOSIS — E785 Hyperlipidemia, unspecified: Secondary | ICD-10-CM | POA: Diagnosis present

## 2016-03-21 DIAGNOSIS — E1142 Type 2 diabetes mellitus with diabetic polyneuropathy: Secondary | ICD-10-CM | POA: Diagnosis present

## 2016-03-21 DIAGNOSIS — Z9221 Personal history of antineoplastic chemotherapy: Secondary | ICD-10-CM

## 2016-03-21 DIAGNOSIS — R9431 Abnormal electrocardiogram [ECG] [EKG]: Secondary | ICD-10-CM

## 2016-03-21 DIAGNOSIS — I2510 Atherosclerotic heart disease of native coronary artery without angina pectoris: Secondary | ICD-10-CM | POA: Diagnosis present

## 2016-03-21 DIAGNOSIS — Z951 Presence of aortocoronary bypass graft: Secondary | ICD-10-CM

## 2016-03-21 DIAGNOSIS — G4733 Obstructive sleep apnea (adult) (pediatric): Secondary | ICD-10-CM | POA: Diagnosis present

## 2016-03-21 DIAGNOSIS — I959 Hypotension, unspecified: Secondary | ICD-10-CM | POA: Diagnosis not present

## 2016-03-21 DIAGNOSIS — J811 Chronic pulmonary edema: Secondary | ICD-10-CM

## 2016-03-21 DIAGNOSIS — G931 Anoxic brain damage, not elsewhere classified: Secondary | ICD-10-CM | POA: Diagnosis not present

## 2016-03-21 DIAGNOSIS — J449 Chronic obstructive pulmonary disease, unspecified: Secondary | ICD-10-CM | POA: Diagnosis present

## 2016-03-21 DIAGNOSIS — C9001 Multiple myeloma in remission: Secondary | ICD-10-CM | POA: Diagnosis present

## 2016-03-21 DIAGNOSIS — E1151 Type 2 diabetes mellitus with diabetic peripheral angiopathy without gangrene: Secondary | ICD-10-CM | POA: Diagnosis not present

## 2016-03-21 DIAGNOSIS — Z4659 Encounter for fitting and adjustment of other gastrointestinal appliance and device: Secondary | ICD-10-CM

## 2016-03-21 DIAGNOSIS — J9601 Acute respiratory failure with hypoxia: Secondary | ICD-10-CM

## 2016-03-21 DIAGNOSIS — I255 Ischemic cardiomyopathy: Secondary | ICD-10-CM | POA: Diagnosis present

## 2016-03-21 DIAGNOSIS — Z87891 Personal history of nicotine dependence: Secondary | ICD-10-CM

## 2016-03-21 DIAGNOSIS — E1165 Type 2 diabetes mellitus with hyperglycemia: Secondary | ICD-10-CM

## 2016-03-21 DIAGNOSIS — N182 Chronic kidney disease, stage 2 (mild): Secondary | ICD-10-CM | POA: Diagnosis present

## 2016-03-21 DIAGNOSIS — I10 Essential (primary) hypertension: Secondary | ICD-10-CM | POA: Diagnosis not present

## 2016-03-21 DIAGNOSIS — N184 Chronic kidney disease, stage 4 (severe): Secondary | ICD-10-CM | POA: Diagnosis not present

## 2016-03-21 DIAGNOSIS — J80 Acute respiratory distress syndrome: Secondary | ICD-10-CM

## 2016-03-21 DIAGNOSIS — Z590 Homelessness: Secondary | ICD-10-CM

## 2016-03-21 DIAGNOSIS — Z9289 Personal history of other medical treatment: Secondary | ICD-10-CM

## 2016-03-21 DIAGNOSIS — Z66 Do not resuscitate: Secondary | ICD-10-CM | POA: Diagnosis present

## 2016-03-21 DIAGNOSIS — E1143 Type 2 diabetes mellitus with diabetic autonomic (poly)neuropathy: Secondary | ICD-10-CM | POA: Diagnosis present

## 2016-03-21 DIAGNOSIS — E872 Acidosis: Secondary | ICD-10-CM | POA: Diagnosis not present

## 2016-03-21 DIAGNOSIS — I25710 Atherosclerosis of autologous vein coronary artery bypass graft(s) with unstable angina pectoris: Secondary | ICD-10-CM | POA: Diagnosis present

## 2016-03-21 DIAGNOSIS — R079 Chest pain, unspecified: Secondary | ICD-10-CM

## 2016-03-21 DIAGNOSIS — N170 Acute kidney failure with tubular necrosis: Secondary | ICD-10-CM | POA: Diagnosis present

## 2016-03-21 DIAGNOSIS — Z9481 Bone marrow transplant status: Secondary | ICD-10-CM

## 2016-03-21 DIAGNOSIS — D62 Acute posthemorrhagic anemia: Secondary | ICD-10-CM | POA: Diagnosis not present

## 2016-03-21 DIAGNOSIS — I200 Unstable angina: Secondary | ICD-10-CM

## 2016-03-21 DIAGNOSIS — I130 Hypertensive heart and chronic kidney disease with heart failure and stage 1 through stage 4 chronic kidney disease, or unspecified chronic kidney disease: Secondary | ICD-10-CM | POA: Diagnosis present

## 2016-03-21 DIAGNOSIS — E669 Obesity, unspecified: Secondary | ICD-10-CM | POA: Diagnosis present

## 2016-03-21 DIAGNOSIS — Z515 Encounter for palliative care: Secondary | ICD-10-CM | POA: Diagnosis present

## 2016-03-21 DIAGNOSIS — K219 Gastro-esophageal reflux disease without esophagitis: Secondary | ICD-10-CM | POA: Diagnosis present

## 2016-03-21 DIAGNOSIS — R9439 Abnormal result of other cardiovascular function study: Secondary | ICD-10-CM

## 2016-03-21 DIAGNOSIS — F418 Other specified anxiety disorders: Secondary | ICD-10-CM | POA: Diagnosis present

## 2016-03-21 DIAGNOSIS — R4020 Unspecified coma: Secondary | ICD-10-CM | POA: Diagnosis not present

## 2016-03-21 DIAGNOSIS — I700 Atherosclerosis of aorta: Secondary | ICD-10-CM | POA: Diagnosis present

## 2016-03-21 DIAGNOSIS — I340 Nonrheumatic mitral (valve) insufficiency: Secondary | ICD-10-CM | POA: Diagnosis present

## 2016-03-21 DIAGNOSIS — J410 Simple chronic bronchitis: Secondary | ICD-10-CM

## 2016-03-21 DIAGNOSIS — R55 Syncope and collapse: Secondary | ICD-10-CM | POA: Diagnosis present

## 2016-03-21 DIAGNOSIS — Z978 Presence of other specified devices: Secondary | ICD-10-CM

## 2016-03-21 DIAGNOSIS — Z9889 Other specified postprocedural states: Secondary | ICD-10-CM

## 2016-03-21 DIAGNOSIS — R579 Shock, unspecified: Secondary | ICD-10-CM | POA: Diagnosis not present

## 2016-03-21 DIAGNOSIS — J9811 Atelectasis: Secondary | ICD-10-CM

## 2016-03-21 DIAGNOSIS — I97130 Postprocedural heart failure following cardiac surgery: Secondary | ICD-10-CM | POA: Diagnosis not present

## 2016-03-21 DIAGNOSIS — D689 Coagulation defect, unspecified: Secondary | ICD-10-CM | POA: Diagnosis not present

## 2016-03-21 DIAGNOSIS — Z419 Encounter for procedure for purposes other than remedying health state, unspecified: Secondary | ICD-10-CM

## 2016-03-21 DIAGNOSIS — Z833 Family history of diabetes mellitus: Secondary | ICD-10-CM

## 2016-03-21 DIAGNOSIS — Z6835 Body mass index (BMI) 35.0-35.9, adult: Secondary | ICD-10-CM

## 2016-03-21 DIAGNOSIS — Z7982 Long term (current) use of aspirin: Secondary | ICD-10-CM

## 2016-03-21 DIAGNOSIS — Z9484 Stem cells transplant status: Secondary | ICD-10-CM

## 2016-03-21 DIAGNOSIS — R253 Fasciculation: Secondary | ICD-10-CM | POA: Diagnosis present

## 2016-03-21 DIAGNOSIS — I4891 Unspecified atrial fibrillation: Secondary | ICD-10-CM | POA: Diagnosis not present

## 2016-03-21 DIAGNOSIS — E039 Hypothyroidism, unspecified: Secondary | ICD-10-CM | POA: Diagnosis present

## 2016-03-21 DIAGNOSIS — D649 Anemia, unspecified: Secondary | ICD-10-CM | POA: Diagnosis present

## 2016-03-21 DIAGNOSIS — I129 Hypertensive chronic kidney disease with stage 1 through stage 4 chronic kidney disease, or unspecified chronic kidney disease: Secondary | ICD-10-CM | POA: Diagnosis present

## 2016-03-21 DIAGNOSIS — I25110 Atherosclerotic heart disease of native coronary artery with unstable angina pectoris: Principal | ICD-10-CM | POA: Diagnosis present

## 2016-03-21 DIAGNOSIS — I25708 Atherosclerosis of coronary artery bypass graft(s), unspecified, with other forms of angina pectoris: Secondary | ICD-10-CM | POA: Diagnosis not present

## 2016-03-21 DIAGNOSIS — G40901 Epilepsy, unspecified, not intractable, with status epilepticus: Secondary | ICD-10-CM | POA: Diagnosis present

## 2016-03-21 DIAGNOSIS — D696 Thrombocytopenia, unspecified: Secondary | ICD-10-CM | POA: Diagnosis present

## 2016-03-21 DIAGNOSIS — Z01818 Encounter for other preprocedural examination: Secondary | ICD-10-CM

## 2016-03-21 DIAGNOSIS — Z794 Long term (current) use of insulin: Secondary | ICD-10-CM

## 2016-03-21 DIAGNOSIS — I97120 Postprocedural cardiac arrest following cardiac surgery: Secondary | ICD-10-CM | POA: Diagnosis not present

## 2016-03-21 DIAGNOSIS — I259 Chronic ischemic heart disease, unspecified: Secondary | ICD-10-CM

## 2016-03-21 DIAGNOSIS — I50811 Acute right heart failure: Secondary | ICD-10-CM | POA: Diagnosis not present

## 2016-03-21 DIAGNOSIS — J939 Pneumothorax, unspecified: Secondary | ICD-10-CM

## 2016-03-21 DIAGNOSIS — I252 Old myocardial infarction: Secondary | ICD-10-CM

## 2016-03-21 HISTORY — DX: Bradycardia, unspecified: R00.1

## 2016-03-21 HISTORY — DX: Hypotension, unspecified: I95.9

## 2016-03-21 LAB — CBC WITH DIFFERENTIAL/PLATELET
BASOS ABS: 0 10*3/uL (ref 0.0–0.1)
BASOS PCT: 0 %
EOS ABS: 0 10*3/uL (ref 0.0–0.7)
Eosinophils Relative: 0 %
HCT: 29.4 % — ABNORMAL LOW (ref 39.0–52.0)
HEMOGLOBIN: 9.6 g/dL — AB (ref 13.0–17.0)
Lymphocytes Relative: 14 %
Lymphs Abs: 0.7 10*3/uL (ref 0.7–4.0)
MCH: 26.7 pg (ref 26.0–34.0)
MCHC: 32.7 g/dL (ref 30.0–36.0)
MCV: 81.9 fL (ref 78.0–100.0)
Monocytes Absolute: 0.3 10*3/uL (ref 0.1–1.0)
Monocytes Relative: 6 %
NEUTROS ABS: 3.6 10*3/uL (ref 1.7–7.7)
NEUTROS PCT: 80 %
Platelets: 65 10*3/uL — ABNORMAL LOW (ref 150–400)
RBC: 3.59 MIL/uL — AB (ref 4.22–5.81)
RDW: 17.6 % — ABNORMAL HIGH (ref 11.5–15.5)
WBC: 4.6 10*3/uL (ref 4.0–10.5)

## 2016-03-21 LAB — BASIC METABOLIC PANEL
ANION GAP: 9 (ref 5–15)
BUN: 17 mg/dL (ref 6–20)
CALCIUM: 8.6 mg/dL — AB (ref 8.9–10.3)
CO2: 23 mmol/L (ref 22–32)
Chloride: 104 mmol/L (ref 101–111)
Creatinine, Ser: 1.41 mg/dL — ABNORMAL HIGH (ref 0.61–1.24)
GFR, EST AFRICAN AMERICAN: 56 mL/min — AB (ref >60)
GFR, EST NON AFRICAN AMERICAN: 48 mL/min — AB (ref >60)
Glucose, Bld: 305 mg/dL — ABNORMAL HIGH (ref 65–99)
Potassium: 4.6 mmol/L (ref 3.5–5.1)
SODIUM: 136 mmol/L (ref 135–145)

## 2016-03-21 LAB — GLUCOSE, CAPILLARY
Glucose-Capillary: 336 mg/dL — ABNORMAL HIGH (ref 65–99)
Glucose-Capillary: 363 mg/dL — ABNORMAL HIGH (ref 65–99)

## 2016-03-21 LAB — HEMOGLOBIN A1C
HEMOGLOBIN A1C: 9.1 % — AB (ref 4.8–5.6)
MEAN PLASMA GLUCOSE: 214 mg/dL

## 2016-03-21 LAB — I-STAT TROPONIN, ED: TROPONIN I, POC: 0.08 ng/mL (ref 0.00–0.08)

## 2016-03-21 MED ORDER — SODIUM CHLORIDE 0.9 % IV BOLUS (SEPSIS)
500.0000 mL | Freq: Once | INTRAVENOUS | Status: AC
  Administered 2016-03-21: 500 mL via INTRAVENOUS

## 2016-03-21 MED ORDER — MAGNESIUM OXIDE 420 MG PO TABS: 840.0000 mg | ORAL_TABLET | Freq: Two times a day (BID) | ORAL | Status: DC

## 2016-03-21 MED ORDER — FLUTICASONE PROPIONATE 50 MCG/ACT NA SUSP
2.0000 | Freq: Every day | NASAL | Status: DC | PRN
  Filled 2016-03-21: qty 16

## 2016-03-21 MED ORDER — ONDANSETRON HCL 4 MG PO TABS: 4.0000 mg | ORAL_TABLET | Freq: Four times a day (QID) | ORAL | Status: DC | PRN

## 2016-03-21 MED ORDER — INSULIN ASPART 100 UNIT/ML ~~LOC~~ SOLN
0.0000 [IU] | Freq: Three times a day (TID) | SUBCUTANEOUS | Status: DC
  Administered 2016-03-21: 7 [IU] via SUBCUTANEOUS
  Administered 2016-03-22: 5 [IU] via SUBCUTANEOUS
  Administered 2016-03-22 – 2016-03-23 (×3): 7 [IU] via SUBCUTANEOUS
  Administered 2016-03-23 (×2): 3 [IU] via SUBCUTANEOUS
  Administered 2016-03-24 (×2): 7 [IU] via SUBCUTANEOUS
  Administered 2016-03-24: 3 [IU] via SUBCUTANEOUS
  Administered 2016-03-25: 2 [IU] via SUBCUTANEOUS
  Administered 2016-03-26: 3 [IU] via SUBCUTANEOUS
  Administered 2016-03-26 – 2016-03-27 (×3): 2 [IU] via SUBCUTANEOUS
  Administered 2016-03-27: 5 [IU] via SUBCUTANEOUS

## 2016-03-21 MED ORDER — ACETAMINOPHEN 325 MG PO TABS: 650.0000 mg | ORAL_TABLET | Freq: Four times a day (QID) | ORAL | Status: DC | PRN

## 2016-03-21 MED ORDER — ASPIRIN EC 81 MG PO TBEC
81.0000 mg | DELAYED_RELEASE_TABLET | Freq: Every day | ORAL | Status: DC
  Administered 2016-03-22 – 2016-03-27 (×6): 81 mg via ORAL
  Filled 2016-03-21 (×6): qty 1

## 2016-03-21 MED ORDER — INSULIN ASPART 100 UNIT/ML ~~LOC~~ SOLN
0.0000 [IU] | Freq: Every day | SUBCUTANEOUS | Status: DC
  Administered 2016-03-21: 5 [IU] via SUBCUTANEOUS
  Administered 2016-03-22: 2 [IU] via SUBCUTANEOUS
  Administered 2016-03-23 – 2016-03-24 (×2): 3 [IU] via SUBCUTANEOUS
  Administered 2016-03-25: 4 [IU] via SUBCUTANEOUS
  Administered 2016-03-26 – 2016-03-27 (×2): 2 [IU] via SUBCUTANEOUS

## 2016-03-21 MED ORDER — SERTRALINE HCL 50 MG PO TABS
50.0000 mg | ORAL_TABLET | Freq: Every day | ORAL | Status: DC
  Administered 2016-03-22 – 2016-04-03 (×8): 50 mg via ORAL
  Filled 2016-03-21 (×8): qty 1

## 2016-03-21 MED ORDER — HYDROCODONE-ACETAMINOPHEN 5-325 MG PO TABS
1.0000 | ORAL_TABLET | Freq: Four times a day (QID) | ORAL | Status: AC | PRN
  Administered 2016-03-21 – 2016-03-22 (×2): 1 via ORAL
  Filled 2016-03-21 (×2): qty 1

## 2016-03-21 MED ORDER — ALBUTEROL SULFATE (2.5 MG/3ML) 0.083% IN NEBU
2.5000 mg | INHALATION_SOLUTION | Freq: Three times a day (TID) | RESPIRATORY_TRACT | Status: DC
  Filled 2016-03-21: qty 3

## 2016-03-21 MED ORDER — MAGNESIUM OXIDE 400 (241.3 MG) MG PO TABS
400.0000 mg | ORAL_TABLET | Freq: Two times a day (BID) | ORAL | Status: DC
  Administered 2016-03-21 – 2016-03-27 (×13): 400 mg via ORAL
  Filled 2016-03-21 (×13): qty 1

## 2016-03-21 MED ORDER — ONDANSETRON HCL 4 MG/2ML IJ SOLN: 4.0000 mg | Freq: Four times a day (QID) | INTRAMUSCULAR | Status: DC | PRN

## 2016-03-21 MED ORDER — SODIUM CHLORIDE 0.9 % IV SOLN: INTRAVENOUS | Status: AC

## 2016-03-21 MED ORDER — ALBUTEROL SULFATE (2.5 MG/3ML) 0.083% IN NEBU
2.5000 mg | INHALATION_SOLUTION | Freq: Three times a day (TID) | RESPIRATORY_TRACT | Status: DC
  Administered 2016-03-22 (×2): 2.5 mg via RESPIRATORY_TRACT
  Filled 2016-03-21 (×4): qty 3

## 2016-03-21 MED ORDER — LEVOTHYROXINE SODIUM 50 MCG PO TABS
50.0000 ug | ORAL_TABLET | Freq: Every day | ORAL | Status: DC
  Administered 2016-03-22 – 2016-04-03 (×8): 50 ug via ORAL
  Filled 2016-03-21 (×10): qty 1

## 2016-03-21 MED ORDER — LEVETIRACETAM ER 500 MG PO TB24
500.0000 mg | ORAL_TABLET | Freq: Every day | ORAL | Status: DC
  Administered 2016-03-22 – 2016-03-27 (×6): 500 mg via ORAL
  Filled 2016-03-21 (×8): qty 1

## 2016-03-21 MED ORDER — SODIUM CHLORIDE 0.9% FLUSH
3.0000 mL | Freq: Two times a day (BID) | INTRAVENOUS | Status: DC
  Administered 2016-03-21 – 2016-03-27 (×8): 3 mL via INTRAVENOUS

## 2016-03-21 MED ORDER — ACYCLOVIR 200 MG PO CAPS
200.0000 mg | ORAL_CAPSULE | Freq: Two times a day (BID) | ORAL | Status: DC
  Administered 2016-03-21 – 2016-04-03 (×19): 200 mg via ORAL
  Filled 2016-03-21 (×27): qty 1

## 2016-03-21 MED ORDER — ACETAMINOPHEN 650 MG RE SUPP: 650.0000 mg | Freq: Four times a day (QID) | RECTAL | Status: DC | PRN

## 2016-03-21 NOTE — Consult Note (Signed)
CARDIOLOGY CONSULT NOTE   Patient ID: Douglas Mccoy MRN: [id number] DOB/AGE: Jan 09, 1946 71 y.o.  Admit date: 03/24/2016  Primary Physician   [facility] Primary Cardiologist   [place] Reason for Consultation   Syncope Requesting Physician  Dr. Marily Memos  HPI: Douglas Mccoy is a 71 y.o. 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  Patient hasn't seen any cardiologist since his CABG. At that time he had a right carotid endarterectomy. Patient is unsure when was the last time he had a carotid Doppler. He got out of jail in Sept  2017. He has been dealing with chronic hypotension and dizziness since then. Recent multiple ER visits due to this. Last admission 03/20/15-03/20/16 for syncope. He passed out while eating lunch. No prodrome. He admitted and ruled out. Plan was to get outpatient monitor and discharge.  Patient lives in rehabilitation center. He again had a episode of syncope today after eating lunch. He did felt visual  changes and dizziness prior to passing out. Him to ER for further evaluation.  Patient admits to having chronic shortness of breath with exertion without angina. Release with rest. No orthopnea, PND, lower extremity edema, palpitation, melena or blood in his stool or urine.  Point-of-care troponin 0.01 --> 0.08. Hemoglobin 9.6. Electrolytes normal. Serum creatinine 1.4. EKG shows sinus rhythm with nonspecific T-wave inversion in inferior lateral lead. Which is intermittently present on prior EKGs. No chest pain.  Echocardiogram December 2017 showed normal left ventricular function, no wall motion abnormality, grade 1 diastolic dysfunction, aortic sclerosis without significant stenosis, mild mitral regurgitation, PA peak pressure of 19 mmHg.  Past Medical History:  Diagnosis Date  .  Anemia   . Anxiety   . Arthritis    "touch in my hands" (01/12/2016)  . Bradycardia   . CAD (coronary artery disease)   . Chronic back pain    "upper and lower" (01/12/2016)  . Chronic bronchitis ([place])   . Chronic kidney disease    "I'm on a renal diet" (01/12/2016)  . COPD (chronic obstructive pulmonary disease) ([place])   . Depression   . GERD (gastroesophageal reflux disease)   . Heart murmur    "when I was little"  . History of fractured vertebra    "between shoulder blades"  . History of hiatal hernia   . History of stem cell transplant ([place])   . Hyperlipidemia   . Hypertension   . Hypotension   . Hypothyroidism   . Multiple myeloma ([place])   . Myocardial infarction    "I've had 2" (01/12/2016)  . Seizures ([place]) 2017   "when my potassium got too high" (01/12/2016)  . Shingles   . Sleep apnea    "I don't have a mask right now" (01/12/2016)  . Stroke ([place])    "I've had 2; fingers stay colder" (pointes to left hand; 3rd, 4th, and 5th digits" (01/12/2016)  . Type II diabetes mellitus ([place])      Past Surgical History:  Procedure Laterality Date  . CAROTID ARTERY ANGIOPLASTY Right    Right carotid artery "cleaned out"  . CORONARY ARTERY BYPASS GRAFT  2004   CABG X4"  . LAPAROSCOPIC CHOLECYSTECTOMY    . TONSILLECTOMY  ~ 1950    No Active Allergies  I have reviewed the patient's current medications . acyclovir  200 mg Oral BID  .  albuterol  2 puff Inhalation TID  . aspirin EC  81 mg Oral Daily  . insulin aspart  0-5 Units Subcutaneous QHS  . insulin aspart  0-9 Units Subcutaneous TID WC  . levETIRAcetam  500 mg Oral Daily  . [START ON 03/22/2016] levothyroxine  50 mcg Oral QAC breakfast  . Magnesium Oxide  840 mg Oral BID  . sertraline  50 mg Oral Daily  . sodium chloride flush  3 mL Intravenous Q12H   . sodium chloride     acetaminophen **OR** acetaminophen, fluticasone, ondansetron **OR** ondansetron (ZOFRAN) IV  Prior to Admission medications   Medication  Sig Start Date End Date Taking? Authorizing Provider  acyclovir (ZOVIRAX) 200 MG capsule Take 200 mg by mouth 2 (two) times daily.   Yes Historical Provider, MD  albuterol (PROVENTIL HFA;VENTOLIN HFA) 108 (90 Base) MCG/ACT inhaler Inhale 2 puffs into the lungs 3 (three) times daily.    Yes Historical Provider, MD  aspirin EC 81 MG tablet Take 81 mg by mouth daily.   Yes Historical Provider, MD  fluticasone (FLONASE) 50 MCG/ACT nasal spray Place 2 sprays into both nostrils daily. Patient taking differently: Place 2 sprays into both nostrils daily as needed for allergies.  01/07/16  Yes [place], DO  gabapentin (NEURONTIN) 600 MG tablet Take 600 mg by mouth 2 (two) times daily.   Yes Historical Provider, MD  insulin aspart (NOVOLOG) 100 UNIT/ML injection Inject 0-10 Units into the skin 3 (three) times daily with meals. Inject as per sliding scale: 0 - 139=  0 units;  140 - 199 = 2 units;  200 - 250= 4 units;  251 - 299= 6 units;  300 - 349= 8 units;  350 - 450= 10 units  Call MD for CBG less than 60 or greater than 450   Yes Historical Provider, MD  lenalidomide (REVLIMID) 15 MG capsule Take 15 mg by mouth daily.   Yes Historical Provider, MD  levETIRAcetam (KEPPRA XR) 500 MG 24 hr tablet Take 1 tablet (500 mg total) by mouth daily. 01/12/16  Yes Nita Sells, MD  levothyroxine (SYNTHROID) 50 MCG tablet Take 1 tablet (50 mcg total) by mouth daily before breakfast. 10/23/15  Yes Florencia Reasons, MD  Magnesium Oxide 420 MG TABS Take 840 mg by mouth 2 (two) times daily.   Yes Historical Provider, MD  Multiple Vitamins-Minerals (CENTRUM SILVER ADULT 50+) TABS Take 1 tablet by mouth daily.   Yes Historical Provider, MD  naproxen sodium (ANAPROX) 220 MG tablet Take 220 mg by mouth 2 (two) times daily as needed (pain).   Yes Historical Provider, MD  sertraline (ZOLOFT) 50 MG tablet Take 50 mg by mouth daily.   Yes Historical Provider, MD  tiZANidine (ZANAFLEX) 4 MG tablet Take 4 mg by mouth 2  (two) times daily.    Yes Historical Provider, MD     Social History   Social History  . Marital status: Divorced    Spouse name: N/A  . Number of children: N/A  . Years of education: N/A   Occupational History  . Not on file.   Social History Main Topics  . Smoking status: Former Smoker    Packs/day: 2.00    Years: 35.00    Types: Cigarettes    Quit date: 04/08/1988  . Smokeless tobacco: Never Used  . Alcohol use No  . Drug use: No  . Sexual activity: Not Currently   Other Topics Concern  . Not on file   Social  History Narrative  . No narrative on file    Family Status  Relation Status  . Mother Deceased  . Father Deceased  . Neg Hx    Family History  Problem Relation Age of Onset  . Diabetes Mother   . Cancer Neg Hx   . Stroke Neg Hx      ROS:  Full 14 point review of systems complete and found to be negative unless listed above.  Physical Exam: Blood pressure 180/74, pulse 65, temperature 97.6 F (36.4 C), temperature source Oral, resp. rate 19, height 5' 5"  (1.651 m), weight 150 lb (68 kg), SpO2 97 %.  General: Well developed, well nourished, male in no acute distress Head: Eyes PERRLA, No xanthomas. Normocephalic and atraumatic, oropharynx without edema or exudate.  Lungs: Resp regular and unlabored, CTA. Heart: RRR no s3, s4, soft systolic murmurs..   Neck: Bilateral carotid bruits ?Marland Kitchen No lymphadenopathy. No  JVD. Abdomen: Bowel sounds present, abdomen soft and non-tender without masses or hernias noted. Msk:  No spine or cva tenderness. No weakness, no joint deformities or effusions. Extremities: No clubbing, cyanosis or edema. DP/PT/Radials 2+ and equal bilaterally. Neuro: Alert and oriented X 3. No focal deficits noted. Psych:  Good affect, responds appropriately Skin: No rashes or lesions noted.  Labs:   Lab Results  Component Value Date   WBC 4.6 03/30/2016   HGB 9.6 (L) 04/06/2016   HCT 29.4 (L) 04/05/2016   MCV 81.9 03/20/2016   PLT 65  (L) 03/19/2016   No results for input(s): INR in the last 72 hours.  Recent Labs Lab 03/20/16 0441 03/16/2016 1436  NA 135 136  K 4.4 4.6  CL 104 104  CO2 22 23  BUN 19 17  CREATININE 1.24 1.41*  CALCIUM 9.1 8.6*  PROT 6.0*  --   BILITOT 0.8  --   ALKPHOS 54  --   ALT 16*  --   AST 17  --   GLUCOSE 192* 305*  ALBUMIN 3.2*  --    Magnesium  Date Value Ref Range Status  03/14/2016 1.7 1.7 - 2.4 mg/dL Final   No results for input(s): CKTOTAL, CKMB, TROPONINI in the last 72 hours.  Recent Labs  03/19/16 1614 04/07/2016 1502  TROPIPOC 0.01 0.08   No results found for: PROBNP No results found for: CHOL, HDL, LDLCALC, TRIG No results found for: DDIMER Lipase  Date/Time Value Ref Range Status  10/21/2015 09:28 AM 32 11 - 51 U/L Final   TSH  Date/Time Value Ref Range Status  03/19/2016 03:50 PM 5.763 (H) 0.350 - 4.500 uIU/mL Final    Comment:    Performed by a 3rd Generation assay with a functional sensitivity of <=0.01 uIU/mL.   T3, Free  Date/Time Value Ref Range Status  10/21/2015 09:28 AM 1.8 (L) 2.0 - 4.4 pg/mL Final    Comment:    (NOTE) Performed At: [facility] [place], [place] [id number] Lindon Romp MD [id number]    No results found for: VITAMINB12, FOLATE, FERRITIN, TIBC, IRON, RETICCTPCT  Echo: 01/11/16  Study Conclusions  - Left ventricle: The cavity size was normal. Wall thickness was   increased in a pattern of mild LVH. The estimated ejection   fraction was 55%. Wall motion was normal; there were no regional   wall motion abnormalities. Doppler parameters are consistent with   abnormal left ventricular relaxation (grade 1 diastolic   dysfunction). - Aortic valve: Trileaflet; moderately calcified leaflets.   Sclerosis  without stenosis. There was trivial regurgitation. Mean   gradient (S): 8 mm Hg. - Mitral valve: Moderately calcified annulus. There was mild   regurgitation. - Left atrium: The atrium was mildly  dilated. - Right ventricle: The cavity size was normal. Systolic function   was normal. - Pulmonary arteries: PA peak pressure: 19 mm Hg (S). - Inferior vena cava: The vessel was normal in size. The   respirophasic diameter changes were in the normal range (>= 50%),   consistent with normal central venous pressure.  Impressions:  - Normal LV size with mild LV hypertrophy. EF 55%. Normal RV size   and systolic function. Aortic sclerosis without significant   stenosis. Mild mitral regurgitation.  Radiology:  X-ray Chest Pa And Lateral  Result Date: 03/19/2016 CLINICAL DATA:  Respiratory crackles, pneumonia 2 weeks ago. Hypotensive. History of COPD, CABG, hypertension. EXAM: CHEST  2 VIEW COMPARISON:  Chest radiograph March 14, 2016 FINDINGS: Cardiac silhouette is upper limits normal size, calcified tortuous aorta. Status post median sternotomy for CABG with fractured midsternal heroin. Chronically elevated RIGHT hemidiaphragm. No pleural effusion or focal consolidation. No pneumothorax. Soft tissue planes included osseous structures are nonsuspicious. Single level mid thoracic vertebral body cement augmentation. Mild old approximate T11 compression fracture. Surgical clips in the included right abdomen compatible with cholecystectomy. IMPRESSION: Stable examination: Borderline cardiomegaly, no acute pulmonary process. Electronically Signed   By: Elon Alas M.D.   On: 03/19/2016 19:20    ASSESSMENT AND PLAN:     1. Syncope - Patient has a history of hypotension since September 2017. This is his second episode of syncope in past 3 days. Recent admission did not show any arrhythmia on telemetry. He supposed to get his event monitor tomorrow. - Check orthostatic vitals. Recent echo showed normal LV function with mild mitral regurgitation. - Patient has a history of CABG and right CEA. No evaluation done since then. The patient does not have any chest pain with exertion however has dyspnea.     - monitor on telemetry. Cycle enzyme. NPO after MN. His dyspnea on exertion and syncope could in ischemic. Stress test in AM. . Likely loop for recurrent syncope.        SignedLeanor Kail, [place] 03/20/2016, 6:13 PM Pager [phone]  Co-Sign MD  The patient was seen and examined, and I agree with the history, physical exam, assessment and plan as documented above, with modifications as noted below. 71 yr old male with multiple admissions since 09/2015 for dizziness, hyperglycemia, hyperkalemia, hypotension (multiple episodes), near syncope, and syncope. Past medical history noted above which includes CABG in 2004, multiple myeloma s/p bone marrow transplant, CKD, poorly controlled diabetes, and COPD. No arrhythmias have been documented thus far during prior admissions. Echocardiogram on 01/11/16 showed normal LV systolic function and regional wall motion, LVEF 55%.  He does not recall symptoms prior to CABG. He currently has exertional dyspnea with chest tightness. No recent ischemic evaluation. He passed out today after eating a meal. Most recent admission for syncope occurred while he was eating. BP 97/46, HR 55 bpm on arrival.  Unclear if he has autonomic neuropathy in context of poorly controlled diabetes, with consequent hypotension and perhaps vasovagal syncope. He has been orthostatic in the past and these should be repeated. Agree with speech evaluation as deglutition-induced syncope is also in the differential diagnosis. Given 71 yr old CABG with exertional dyspnea and chest tightness, I think he needs a Lexiscan Myoview stress test to also evaluate for an ischemic etiology.  Given multiple episodes, an implantable loop recorder is also indicated to evaluate for bradyarrhythmias with pauses.  Kate Sable, MD, [facility]  03/17/2016 7:25 PM

## 2016-03-21 NOTE — Care Management (Addendum)
ED CM received call from [place] at the [facility] in [place], who states that patient is currently in transitional homeless [facility] program. She is requesting that she be contacted if patient is for continued stay, so can be transferred to a [place] facility.  She also requesting that the [facility] be contacted for care transition planning.  Unit CM will continue to follow -up.

## 2016-03-21 NOTE — H&P (Signed)
History and Physical    Douglas Mccoy BLT:[id number] DOB: Dec 13, 1945 DOA: 04/11/2016  PCP: [place] Patient coming from: home  Chief Complaint: syncope  HPI: Douglas Mccoy is a 71 y.o. male with medical history significant for CAD, chronic kidney disease, COPD, hyperlipidemia hypertension, diabetes, multiple myeloma presents to emergency Department chief complaint of syncope. Patient discharged yesterday after a syncope workup. Will be admitted for observation and facilitation of 30 day Holter monitor with cardiology  Information is obtained from the patient. He states he was eating lunch at the rehabilitation center and in a singing he was waking up on the floor. He denies headache visual disturbances chest pain palpitation shortness of breath. He denies fever chills nausea vomiting diarrhea. He does report a fullness sensation in his throat when eating like "food hanging". Denies cough or choking. He states this will be the fourth time he was admitted for syncope this month. Adjustments to medications were made.  He is scheduled to go to [place] for 30 day Holter monitor tomorrow. He states his blood pressure medication was adjusted discharged yesterday. .   ED Course: Afebrile with a slightly labile blood pressure heart rate at the low end of normal he is not hypoxic  Review of Systems: As per HPI otherwise 10 point review of systems negative.   Ambulatory Status: Ambulates independently  Past Medical History:  Diagnosis Date  . Anemia   . Anxiety   . Arthritis    "touch in my hands" (01/12/2016)  . Bradycardia   . CAD (coronary artery disease)   . Chronic back pain    "upper and lower" (01/12/2016)  . Chronic bronchitis ([place])   . Chronic kidney disease    "I'm on a renal diet" (01/12/2016)  . COPD (chronic obstructive pulmonary disease) ([place])   . Depression   . GERD (gastroesophageal reflux disease)   . Heart murmur    "when I was little"  . History of fractured vertebra     "between shoulder blades"  . History of hiatal hernia   . History of stem cell transplant ([place])   . Hyperlipidemia   . Hypertension   . Hypotension   . Hypothyroidism   . Multiple myeloma ([place])   . Myocardial infarction    "I've had 2" (01/12/2016)  . Seizures ([place]) 2017   "when my potassium got too high" (01/12/2016)  . Shingles   . Sleep apnea    "I don't have a mask right now" (01/12/2016)  . Stroke ([place])    "I've had 2; fingers stay colder" (pointes to left hand; 3rd, 4th, and 5th digits" (01/12/2016)  . Type II diabetes mellitus ([place])     Past Surgical History:  Procedure Laterality Date  . CAROTID ARTERY ANGIOPLASTY Right    Right carotid artery "cleaned out"  . CORONARY ARTERY BYPASS GRAFT  2004   CABG X4"  . LAPAROSCOPIC CHOLECYSTECTOMY    . TONSILLECTOMY  ~ 62    Social History   Social History  . Marital status: Divorced    Spouse name: N/A  . Number of children: N/A  . Years of education: N/A   Occupational History  . Not on file.   Social History Main Topics  . Smoking status: Former Smoker    Packs/day: 2.00    Years: 35.00    Types: Cigarettes    Quit date: 04/08/1988  . Smokeless tobacco: Never Used  . Alcohol use No  . Drug use: No  . Sexual activity: Not  Currently   Other Topics Concern  . Not on file   Social History Narrative  . No narrative on file  Recently discharged to a rehabilitation center  No Active Allergies  Family History  Problem Relation Age of Onset  . Diabetes Mother   . Cancer Neg Hx   . Stroke Neg Hx     Prior to Admission medications   Medication Sig Start Date End Date Taking? Authorizing Provider  acyclovir (ZOVIRAX) 200 MG capsule Take 200 mg by mouth 2 (two) times daily.   Yes Historical Provider, MD  albuterol (PROVENTIL HFA;VENTOLIN HFA) 108 (90 Base) MCG/ACT inhaler Inhale 2 puffs into the lungs 3 (three) times daily.    Yes Historical Provider, MD  aspirin EC 81 MG tablet Take 81 mg by mouth  daily.   Yes Historical Provider, MD  fluticasone (FLONASE) 50 MCG/ACT nasal spray Place 2 sprays into both nostrils daily. Patient taking differently: Place 2 sprays into both nostrils daily as needed for allergies.  01/07/16  Yes Cleona, DO  gabapentin (NEURONTIN) 600 MG tablet Take 600 mg by mouth 2 (two) times daily.   Yes Historical Provider, MD  insulin aspart (NOVOLOG) 100 UNIT/ML injection Inject 0-10 Units into the skin 3 (three) times daily with meals. Inject as per sliding scale: 0 - 139=  0 units;  140 - 199 = 2 units;  200 - 250= 4 units;  251 - 299= 6 units;  300 - 349= 8 units;  350 - 450= 10 units  Call MD for CBG less than 60 or greater than 450   Yes Historical Provider, MD  lenalidomide (REVLIMID) 15 MG capsule Take 15 mg by mouth daily.   Yes Historical Provider, MD  levETIRAcetam (KEPPRA XR) 500 MG 24 hr tablet Take 1 tablet (500 mg total) by mouth daily. 01/12/16  Yes Nita Sells, MD  levothyroxine (SYNTHROID) 50 MCG tablet Take 1 tablet (50 mcg total) by mouth daily before breakfast. 10/23/15  Yes Florencia Reasons, MD  Magnesium Oxide 420 MG TABS Take 840 mg by mouth 2 (two) times daily.   Yes Historical Provider, MD  Multiple Vitamins-Minerals (CENTRUM SILVER ADULT 50+) TABS Take 1 tablet by mouth daily.   Yes Historical Provider, MD  naproxen sodium (ANAPROX) 220 MG tablet Take 220 mg by mouth 2 (two) times daily as needed (pain).   Yes Historical Provider, MD  sertraline (ZOLOFT) 50 MG tablet Take 50 mg by mouth daily.   Yes Historical Provider, MD  tiZANidine (ZANAFLEX) 4 MG tablet Take 4 mg by mouth 2 (two) times daily.    Yes Historical Provider, MD    Physical Exam: Vitals:   03/30/2016 1615 04/05/2016 1630 04/06/2016 1715 03/31/2016 1745  BP: 171/68 159/61 169/66 180/74  Pulse: (!) 56 60 (!) 58 65  Resp: 17 16 14 19   Temp:      TempSrc:      SpO2: 94% 93% 98% 97%  Weight:      Height:         General:  Appears calm and comfortableSitting up in  bed Eyes:  PERRL, EOMI, normal lids, iris ENT:  grossly normal hearing, lips & tongue, mucous membranes of his mouth are moist and pink Neck:  no LAD, masses or thyromegaly Cardiovascular:  RRR, no m/r/g. No LE edema.  Respiratory:  CTA bilaterally, no w/r/r. Normal respiratory effort. Abdomen:  soft, ntnd, positive bowel sounds no guarding or rebounding Skin:  no rash or induration seen on limited  exam Musculoskeletal:  grossly normal tone BUE/BLE, good ROM, no bony abnormality Psychiatric:  grossly normal mood and affect, speech fluent and appropriate, AOx3 Neurologic:  CN 2-12 grossly intact, moves all extremities in coordinated fashion, sensation intact speech clear facial symmetry  Labs on Admission: I have personally reviewed following labs and imaging studies  CBC:  Recent Labs Lab 03/17/16 1504 03/19/16 1550 03/20/16 0441 03/16/2016 1436  WBC  --  4.1 3.4* 4.6  NEUTROABS  --  2.5  --  3.6  HGB 9.5* 9.2* 9.0* 9.6*  HCT 28.0* 27.7* 27.4* 29.4*  MCV  --  82.2 82.3 81.9  PLT  --  72* 64* 65*   Basic Metabolic Panel:  Recent Labs Lab 03/17/16 1504 03/19/16 1550 03/20/16 0441 03/27/2016 1436  NA 135 136 135 136  K 5.5* 4.8 4.4 4.6  CL 102 106 104 104  CO2  --  22 22 23   GLUCOSE 374* 155* 192* 305*  BUN 33* 22* 19 17  CREATININE 1.40* 1.28* 1.24 1.41*  CALCIUM  --  9.0 9.1 8.6*   GFR: Estimated Creatinine Clearance: 41.2 mL/min (by C-G formula based on SCr of 1.41 mg/dL (H)). Liver Function Tests:  Recent Labs Lab 03/19/16 1550 03/20/16 0441  AST 19 17  ALT 16* 16*  ALKPHOS 57 54  BILITOT 1.2 0.8  PROT 6.3* 6.0*  ALBUMIN 3.5 3.2*   No results for input(s): LIPASE, AMYLASE in the last 168 hours. No results for input(s): AMMONIA in the last 168 hours. Coagulation Profile: No results for input(s): INR, PROTIME in the last 168 hours. Cardiac Enzymes: No results for input(s): CKTOTAL, CKMB, CKMBINDEX, TROPONINI in the last 168 hours. BNP (last 3 results) No  results for input(s): PROBNP in the last 8760 hours. HbA1C:  Recent Labs  03/20/16 0441  HGBA1C 9.1*   CBG:  Recent Labs Lab 03/19/16 2145 03/20/16 0818 03/20/16 1110  GLUCAP 327* 203* 284*   Lipid Profile: No results for input(s): CHOL, HDL, LDLCALC, TRIG, CHOLHDL, LDLDIRECT in the last 72 hours. Thyroid Function Tests:  Recent Labs  03/19/16 1550  TSH 5.763*   Anemia Panel: No results for input(s): VITAMINB12, FOLATE, FERRITIN, TIBC, IRON, RETICCTPCT in the last 72 hours. Urine analysis:    Component Value Date/Time   COLORURINE STRAW (A) 03/14/2016 1427   APPEARANCEUR CLEAR 03/14/2016 1427   LABSPEC 1.014 03/14/2016 1427   PHURINE 7.0 03/14/2016 1427   GLUCOSEU >=500 (A) 03/14/2016 1427   HGBUR NEGATIVE 03/14/2016 1427   BILIRUBINUR NEGATIVE 03/14/2016 1427   KETONESUR NEGATIVE 03/14/2016 1427   PROTEINUR NEGATIVE 03/14/2016 1427   NITRITE NEGATIVE 03/14/2016 1427   LEUKOCYTESUR NEGATIVE 03/14/2016 1427    Creatinine Clearance: Estimated Creatinine Clearance: 41.2 mL/min (by C-G formula based on SCr of 1.41 mg/dL (H)).  Sepsis Labs: @LABRCNTIP (procalcitonin:4,lacticidven:4) )No results found for this or any previous visit (from the past 240 hour(s)).   Radiological Exams on Admission: X-ray Chest Pa And Lateral  Result Date: 03/19/2016 CLINICAL DATA:  Respiratory crackles, pneumonia 2 weeks ago. Hypotensive. History of COPD, CABG, hypertension. EXAM: CHEST  2 VIEW COMPARISON:  Chest radiograph March 14, 2016 FINDINGS: Cardiac silhouette is upper limits normal size, calcified tortuous aorta. Status post median sternotomy for CABG with fractured midsternal heroin. Chronically elevated RIGHT hemidiaphragm. No pleural effusion or focal consolidation. No pneumothorax. Soft tissue planes included osseous structures are nonsuspicious. Single level mid thoracic vertebral body cement augmentation. Mild old approximate T11 compression fracture. Surgical clips in the  included right abdomen  compatible with cholecystectomy. IMPRESSION: Stable examination: Borderline cardiomegaly, no acute pulmonary process. Electronically Signed   By: Elon Alas M.D.   On: 03/19/2016 19:20      EKG: Independently reviewed. Sinus rhythm Atrial premature complex Nonspecific intraventricular conduction delay  Assessment/Plan Principal Problem:   Syncope Active Problems:   Type II diabetes mellitus with peripheral circulatory disorder (HCC)   Hypertension   COPD (chronic obstructive pulmonary disease) (HCC)   Essential hypertension, malignant   CAD (coronary artery disease)   Anemia   #1.Syncope. This is his  admission this month for the same. Recent echo unremarkable for any valvular abnormality and a normal EF. Blood pressure medications adjusted at previous hospitalizations. Chest x-ray without infiltrate. No metabolic derangements. He reports compliance with meds. Of note last 2 times patient had syncopal event he was sitting at a table eating. He reports sensation of fullness when eating. Recent echo  Unremarkable for valvular abnormality. Normal EF. -Admit to telemetry -Request cardiology consult -Speech therapy evaluation for swallow study as some concern for swallow syncope -check orthostatics -Gentle IV fluids  #2. Hypertension. Blood pressure on the low end of normal. Review indicates he was on clonidine and lisinopril in the past. He states he took lisinopril this morning is not started his new medication unsure of that name. Home medication list Norvasc. -We'll hold all antihypertensive medications -Gentle IV fluids -Monitor closely  #3. Diabetes. Poor control Serum glucose 305 on admission. Home medications include sliding scale -Sliding scale insulin -Obtain a hemoglobin A1c -IV fluids as noted above  #4. CAD, status post CABG. Denies chest pain. EKG as noted above. -Continue aspirin  #5. COPD. Not on home oxygen Chest x-ray as noted above.  Oxygen saturation level greater than 90% on room air -Monitor  #6. Acute kidney injury in the setting of stage II chronic kidney disease. Creatinine 1.4 on admission -gentle IV fluids -hold nephrotoxins -monitor urine output  #7. Anemia. Likely related to chronic disease. Hemoglobin 9.6 which appears to be close to his baseline. -fobt -OP follow up    DVT prophylaxis: scd  Code Status: full  Family Communication: none  Disposition Plan: home  Consults called: none  Admission status: obs    Radene Gunning MD [facility]  If 7PM-7AM, please contact night-coverage [email or web address] Password [facility]  04/05/2016, 6:11 PM

## 2016-03-21 NOTE — ED Provider Notes (Signed)
Emergency Department Provider Note   I have reviewed the triage vital signs and the nursing notes.   HISTORY  Chief Complaint Hypotension (pt coming in for eval of low HR and hypotension has been seen multiple times for both )   HPI Douglas Mccoy is a 71 y.o. male with PMH of CAD, CKD, COPD, depression, HLD, and HTN presents to the emergency department for evaluation of syncope. The patient was recently admitted for syncope and was discharged to a rehabilitation center. He was scheduled to go to the [facility] for heart monitor placement. Some changes were made to his blood pressure medication. Patient states that he took his lisinopril this morning and has not been able to start the new medication regimen since discharge. He was eating lunch and then the next thing he remembers EMS was in his room. He apparently had hypotension and bradycardia during the event but he was not on the monitor. No tracings from EMS are available. Patient denies any chest pain, dyspnea, palpitations. At this time he is feeling well.    Past Medical History:  Diagnosis Date  . Anemia   . Anxiety   . Arthritis    "touch in my hands" (01/12/2016)  . Bradycardia   . CAD (coronary artery disease)   . Chronic back pain    "upper and lower" (01/12/2016)  . Chronic bronchitis ([place])   . Chronic kidney disease    "I'm on a renal diet" (01/12/2016)  . COPD (chronic obstructive pulmonary disease) ([place])   . Depression   . GERD (gastroesophageal reflux disease)   . Heart murmur    "when I was little"  . History of fractured vertebra    "between shoulder blades"  . History of hiatal hernia   . History of stem cell transplant ([place])   . Hyperlipidemia   . Hypertension   . Hypotension   . Hypothyroidism   . Multiple myeloma ([place])   . Myocardial infarction    "I've had 2" (01/12/2016)  . Seizures ([place]) 2017   "when my potassium got too high" (01/12/2016)  . Shingles   . Sleep apnea    "I don't have a  mask right now" (01/12/2016)  . Stroke ([place])    "I've had 2; fingers stay colder" (pointes to left hand; 3rd, 4th, and 5th digits" (01/12/2016)  . Type II diabetes mellitus [place])     Patient Active Problem List   Diagnosis Date Noted  . Syncope 03/19/2016  . Pre-syncope 01/10/2016  . AKI (acute kidney injury) ([place])   . Hypotension   . Near syncope   . Generalized weakness 01/01/2016  . Dehydration 01/01/2016  . Weakness generalized 01/01/2016  . Anemia   . Essential hypertension, malignant 10/25/2015  . CAD (coronary artery disease) 10/25/2015  . Hx of CABG 10/25/2015  . Metabolic acidosis 11/94/1740  . Chronic bilateral low back pain 10/25/2015  . Depression 10/25/2015  . Late effect of cerebrovascular accident (CVA) 10/25/2015  . Malnutrition of moderate degree 10/22/2015  . Acute renal failure (ARF) ([place]) 10/20/2015  . Hyperkalemia 10/20/2015  . Type II diabetes mellitus with peripheral circulatory disorder ([place]) 10/20/2015  . Hypertension 10/20/2015  . COPD (chronic obstructive pulmonary disease) ([place]) 10/20/2015  . Multiple myeloma ([place]) 10/20/2015    Past Surgical History:  Procedure Laterality Date  . CAROTID ARTERY ANGIOPLASTY Right    Right carotid artery "cleaned out"  . CORONARY ARTERY BYPASS GRAFT  2004   CABG X4"  . LAPAROSCOPIC CHOLECYSTECTOMY    .  TONSILLECTOMY  ~ 1950      Allergies Patient has no known allergies.  Family History  Problem Relation Age of Onset  . Diabetes Mother   . Cancer Neg Hx   . Stroke Neg Hx     Social History Social History  Substance Use Topics  . Smoking status: Former Smoker    Packs/day: 2.00    Years: 35.00    Types: Cigarettes    Quit date: 04/08/1988  . Smokeless tobacco: Never Used  . Alcohol use No    Review of Systems  Constitutional: No fever/chills Eyes: No visual changes. ENT: No sore throat. Cardiovascular: Denies chest pain. Positive syncope and hypotension.  Respiratory: Denies shortness of  breath. Gastrointestinal: No abdominal pain.  No nausea, no vomiting.  No diarrhea.  No constipation. Genitourinary: Negative for dysuria. Musculoskeletal: Negative for back pain. Skin: Negative for rash. Neurological: Negative for headaches, focal weakness or numbness.  10-point ROS otherwise negative.  ____________________________________________   PHYSICAL EXAM:  VITAL SIGNS: ED Triage Vitals  Enc Vitals Group     BP 04/06/2016 1328 (!) 97/46     Pulse Rate 04/04/2016 1328 (!) 55     Resp 03/27/2016 1328 15     Temp 03/27/2016 1328 97.6 F (36.4 C)     Temp Source 04/08/2016 1328 Oral     SpO2 04/13/2016 1328 98 %     Weight 03/27/2016 1325 150 lb (68 kg)     Height 03/19/2016 1325 5' 5"  (1.651 m)     Pain Score 04/10/2016 1325 0   Constitutional: Alert and oriented. Well appearing and in no acute distress. Eyes: Conjunctivae are normal.  Head: Atraumatic. Nose: No congestion/rhinnorhea. Mouth/Throat: Mucous membranes are moist. Oropharynx non-erythematous. Neck: No stridor.   Cardiovascular: Bradycardia. Good peripheral circulation. Grossly normal heart sounds.   Respiratory: Normal respiratory effort.  No retractions. Lungs CTAB. Gastrointestinal: Soft and nontender. No distention.  Musculoskeletal: No lower extremity tenderness nor edema. No gross deformities of extremities. Neurologic:  Normal speech and language. No gross focal neurologic deficits are appreciated.  Skin:  Skin is warm, dry and intact. No rash noted. Psychiatric: Mood and affect are normal. Speech and behavior are normal.  ____________________________________________   LABS (all labs ordered are listed, but only abnormal results are displayed)  Labs Reviewed  BASIC METABOLIC PANEL - Abnormal; Notable for the following:       Result Value   Glucose, Bld 305 (*)    Creatinine, Ser 1.41 (*)    Calcium 8.6 (*)    GFR calc non Af Amer 48 (*)    GFR calc Af Amer 56 (*)    All other components within normal  limits  CBC WITH DIFFERENTIAL/PLATELET - Abnormal; Notable for the following:    RBC 3.59 (*)    Hemoglobin 9.6 (*)    HCT 29.4 (*)    RDW 17.6 (*)    Platelets 65 (*)    All other components within normal limits  BASIC METABOLIC PANEL - Abnormal; Notable for the following:    Sodium 134 (*)    CO2 20 (*)    Glucose, Bld 323 (*)    BUN 24 (*)    Creatinine, Ser 1.34 (*)    Calcium 8.7 (*)    GFR calc non Af Amer 51 (*)    GFR calc Af Amer 59 (*)    All other components within normal limits  CBC - Abnormal; Notable for the following:    WBC 3.1 (*)  RBC 3.45 (*)    Hemoglobin 9.5 (*)    HCT 28.0 (*)    RDW 17.7 (*)    Platelets 67 (*)    All other components within normal limits  GLUCOSE, CAPILLARY - Abnormal; Notable for the following:    Glucose-Capillary 336 (*)    All other components within normal limits  GLUCOSE, CAPILLARY - Abnormal; Notable for the following:    Glucose-Capillary 363 (*)    All other components within normal limits  GLUCOSE, CAPILLARY - Abnormal; Notable for the following:    Glucose-Capillary 259 (*)    All other components within normal limits  I-STAT TROPOININ, ED   ____________________________________________  EKG   EKG Interpretation  Date/Time:  Thursday March 21 2016 13:29:48 EST Ventricular Rate:  57 PR Interval:    QRS Duration: 121 QT Interval:  475 QTC Calculation: 463 R Axis:   29 Text Interpretation:  Sinus rhythm Atrial premature complex Nonspecific intraventricular conduction delay No STEMI.  Confirmed by Shakeeta Godette MD, Abiel Antrim [phone]) on 03/16/2016 1:54:14 PM       ____________________________________________  RADIOLOGY  None ____________________________________________   PROCEDURES  Procedure(s) performed:   Procedures  None ____________________________________________   INITIAL IMPRESSION / ASSESSMENT AND PLAN / ED COURSE  Pertinent labs & imaging results that were available during my care of the patient  were reviewed by me and considered in my medical decision making (see chart for details).  Patient presents to the ED for evaluation of syncope. He was eating lunch and had an additional syncope event. BP medication changes from recent admission not implemented at rehab facility but unclear if this is contributing to syncope. Patient was scheduled to have event monitor placed today but had syncope prior to leaving for appointment. He is feeling well at this time. Vital signs significant for mild hypotension and bradycardia. No AMS. Labs unremarkable.   Discussed patient's case with hospitalist. Patient and family (if present) updated with plan. Care transferred to hospitlaist service.  3:30 PM Spoke with Cardiology service who will consult on the patient while admitted.  I reviewed all nursing notes, vitals, pertinent old records, EKGs, labs, imaging (as available).  ____________________________________________  FINAL CLINICAL IMPRESSION(S) / ED DIAGNOSES  Final diagnoses:  Syncope, unspecified syncope type  Hypotension, unspecified hypotension type     MEDICATIONS GIVEN DURING THIS VISIT:  Medications  aspirin EC tablet 81 mg (not administered)  levETIRAcetam (KEPPRA XR) 24 hr tablet 500 mg (not administered)  fluticasone (FLONASE) 50 MCG/ACT nasal spray 2 spray (not administered)  acyclovir (ZOVIRAX) 200 MG capsule 200 mg (200 mg Oral Given 03/27/2016 2236)  levothyroxine (SYNTHROID, LEVOTHROID) tablet 50 mcg (not administered)  sertraline (ZOLOFT) tablet 50 mg (not administered)  sodium chloride flush (NS) 0.9 % injection 3 mL (3 mLs Intravenous Given 03/24/2016 2238)  insulin aspart (novoLOG) injection 0-9 Units (5 Units Subcutaneous Given 03/22/16 0700)  insulin aspart (novoLOG) injection 0-5 Units (5 Units Subcutaneous Given 04/09/2016 2244)  0.9 %  sodium chloride infusion ( Intravenous Stopped 03/22/16 0200)  acetaminophen (TYLENOL) tablet 650 mg (not administered)    Or  acetaminophen  (TYLENOL) suppository 650 mg (not administered)  ondansetron (ZOFRAN) tablet 4 mg (not administered)    Or  ondansetron (ZOFRAN) injection 4 mg (not administered)  magnesium oxide (MAG-OX) tablet 400 mg (400 mg Oral Given 04/09/2016 2236)  albuterol (PROVENTIL) (2.5 MG/3ML) 0.083% nebulizer solution 2.5 mg (2.5 mg Inhalation Not Given 03/22/16 0905)  HYDROcodone-acetaminophen (NORCO/VICODIN) 5-325 MG per tablet 1 tablet (1 tablet  Oral Given 04/07/2016 2236)  regadenoson (LEXISCAN) injection SOLN 0.4 mg (not administered)  regadenoson (LEXISCAN) 0.4 MG/5ML injection SOLN (not administered)  sodium chloride 0.9 % bolus 500 mL (0 mLs Intravenous Stopped 03/22/2016 1635)  technetium tetrofosmin (TC-MYOVIEW) injection 10 millicurie (10 millicuries Intravenous Contrast Given 03/22/16 0818)     NEW OUTPATIENT MEDICATIONS STARTED DURING THIS VISIT:  None   Note:  This document was prepared using Dragon voice recognition software and may include unintentional dictation errors.  Nanda Quinton, MD Emergency Medicine   Margette Fast, MD 03/22/16 [phone]

## 2016-03-21 NOTE — ED Triage Notes (Signed)
Pt from [facility], [place].  Pt was supposed to be heading to the [place] in [place] today to have a heart monitor placed when he became hypotensive and bradycardic this has happened multiple times in the past and he was recently hospitalized for this

## 2016-03-21 NOTE — ED Notes (Signed)
Pt was given diet ginger ale per MD.

## 2016-03-21 NOTE — ED Triage Notes (Signed)
Pt unable to recall what happened states that the last thing he remembers was eating lunch when he woke up EMS was there

## 2016-03-21 NOTE — ED Notes (Signed)
Attempted report advised will call back in ten and if unavailable will give bedside report

## 2016-03-22 ENCOUNTER — Observation Stay (HOSPITAL_COMMUNITY): Payer: Medicare Other

## 2016-03-22 DIAGNOSIS — G9340 Encephalopathy, unspecified: Secondary | ICD-10-CM | POA: Diagnosis not present

## 2016-03-22 DIAGNOSIS — G40901 Epilepsy, unspecified, not intractable, with status epilepticus: Secondary | ICD-10-CM | POA: Diagnosis not present

## 2016-03-22 DIAGNOSIS — E872 Acidosis: Secondary | ICD-10-CM | POA: Diagnosis not present

## 2016-03-22 DIAGNOSIS — D6959 Other secondary thrombocytopenia: Secondary | ICD-10-CM | POA: Diagnosis present

## 2016-03-22 DIAGNOSIS — Z9484 Stem cells transplant status: Secondary | ICD-10-CM | POA: Diagnosis not present

## 2016-03-22 DIAGNOSIS — D689 Coagulation defect, unspecified: Secondary | ICD-10-CM | POA: Diagnosis not present

## 2016-03-22 DIAGNOSIS — Z9481 Bone marrow transplant status: Secondary | ICD-10-CM | POA: Diagnosis not present

## 2016-03-22 DIAGNOSIS — J9601 Acute respiratory failure with hypoxia: Secondary | ICD-10-CM | POA: Diagnosis not present

## 2016-03-22 DIAGNOSIS — N170 Acute kidney failure with tubular necrosis: Secondary | ICD-10-CM | POA: Diagnosis present

## 2016-03-22 DIAGNOSIS — I2510 Atherosclerotic heart disease of native coronary artery without angina pectoris: Secondary | ICD-10-CM | POA: Diagnosis not present

## 2016-03-22 DIAGNOSIS — I25110 Atherosclerotic heart disease of native coronary artery with unstable angina pectoris: Secondary | ICD-10-CM | POA: Diagnosis present

## 2016-03-22 DIAGNOSIS — E1151 Type 2 diabetes mellitus with diabetic peripheral angiopathy without gangrene: Secondary | ICD-10-CM | POA: Diagnosis present

## 2016-03-22 DIAGNOSIS — C9001 Multiple myeloma in remission: Secondary | ICD-10-CM | POA: Diagnosis not present

## 2016-03-22 DIAGNOSIS — I959 Hypotension, unspecified: Secondary | ICD-10-CM | POA: Diagnosis present

## 2016-03-22 DIAGNOSIS — R578 Other shock: Secondary | ICD-10-CM | POA: Diagnosis not present

## 2016-03-22 DIAGNOSIS — Z87891 Personal history of nicotine dependence: Secondary | ICD-10-CM | POA: Diagnosis not present

## 2016-03-22 DIAGNOSIS — I97120 Postprocedural cardiac arrest following cardiac surgery: Secondary | ICD-10-CM | POA: Diagnosis not present

## 2016-03-22 DIAGNOSIS — R601 Generalized edema: Secondary | ICD-10-CM | POA: Diagnosis not present

## 2016-03-22 DIAGNOSIS — R079 Chest pain, unspecified: Secondary | ICD-10-CM | POA: Diagnosis not present

## 2016-03-22 DIAGNOSIS — I209 Angina pectoris, unspecified: Secondary | ICD-10-CM

## 2016-03-22 DIAGNOSIS — Z01810 Encounter for preprocedural cardiovascular examination: Secondary | ICD-10-CM | POA: Diagnosis not present

## 2016-03-22 DIAGNOSIS — I50811 Acute right heart failure: Secondary | ICD-10-CM | POA: Diagnosis not present

## 2016-03-22 DIAGNOSIS — I25708 Atherosclerosis of coronary artery bypass graft(s), unspecified, with other forms of angina pectoris: Secondary | ICD-10-CM | POA: Diagnosis not present

## 2016-03-22 DIAGNOSIS — R55 Syncope and collapse: Secondary | ICD-10-CM | POA: Diagnosis not present

## 2016-03-22 DIAGNOSIS — R9431 Abnormal electrocardiogram [ECG] [EKG]: Secondary | ICD-10-CM

## 2016-03-22 DIAGNOSIS — R579 Shock, unspecified: Secondary | ICD-10-CM | POA: Diagnosis not present

## 2016-03-22 DIAGNOSIS — J449 Chronic obstructive pulmonary disease, unspecified: Secondary | ICD-10-CM | POA: Diagnosis present

## 2016-03-22 DIAGNOSIS — I25118 Atherosclerotic heart disease of native coronary artery with other forms of angina pectoris: Secondary | ICD-10-CM | POA: Diagnosis not present

## 2016-03-22 DIAGNOSIS — R9439 Abnormal result of other cardiovascular function study: Secondary | ICD-10-CM | POA: Diagnosis not present

## 2016-03-22 DIAGNOSIS — E1143 Type 2 diabetes mellitus with diabetic autonomic (poly)neuropathy: Secondary | ICD-10-CM | POA: Diagnosis present

## 2016-03-22 DIAGNOSIS — I200 Unstable angina: Secondary | ICD-10-CM | POA: Diagnosis not present

## 2016-03-22 DIAGNOSIS — I6521 Occlusion and stenosis of right carotid artery: Secondary | ICD-10-CM | POA: Diagnosis not present

## 2016-03-22 DIAGNOSIS — I10 Essential (primary) hypertension: Secondary | ICD-10-CM

## 2016-03-22 DIAGNOSIS — E1122 Type 2 diabetes mellitus with diabetic chronic kidney disease: Secondary | ICD-10-CM | POA: Diagnosis present

## 2016-03-22 DIAGNOSIS — Z951 Presence of aortocoronary bypass graft: Secondary | ICD-10-CM | POA: Diagnosis not present

## 2016-03-22 DIAGNOSIS — R4020 Unspecified coma: Secondary | ICD-10-CM | POA: Diagnosis not present

## 2016-03-22 DIAGNOSIS — J80 Acute respiratory distress syndrome: Secondary | ICD-10-CM | POA: Diagnosis not present

## 2016-03-22 DIAGNOSIS — D696 Thrombocytopenia, unspecified: Secondary | ICD-10-CM | POA: Diagnosis not present

## 2016-03-22 DIAGNOSIS — I130 Hypertensive heart and chronic kidney disease with heart failure and stage 1 through stage 4 chronic kidney disease, or unspecified chronic kidney disease: Secondary | ICD-10-CM | POA: Diagnosis present

## 2016-03-22 DIAGNOSIS — J410 Simple chronic bronchitis: Secondary | ICD-10-CM | POA: Diagnosis not present

## 2016-03-22 DIAGNOSIS — D62 Acute posthemorrhagic anemia: Secondary | ICD-10-CM | POA: Diagnosis not present

## 2016-03-22 DIAGNOSIS — I97130 Postprocedural heart failure following cardiac surgery: Secondary | ICD-10-CM | POA: Diagnosis not present

## 2016-03-22 DIAGNOSIS — G931 Anoxic brain damage, not elsewhere classified: Secondary | ICD-10-CM | POA: Diagnosis not present

## 2016-03-22 LAB — BASIC METABOLIC PANEL
Anion gap: 10 (ref 5–15)
BUN: 24 mg/dL — AB (ref 6–20)
CALCIUM: 8.7 mg/dL — AB (ref 8.9–10.3)
CO2: 20 mmol/L — AB (ref 22–32)
CREATININE: 1.34 mg/dL — AB (ref 0.61–1.24)
Chloride: 104 mmol/L (ref 101–111)
GFR calc Af Amer: 59 mL/min — ABNORMAL LOW (ref >60)
GFR calc non Af Amer: 51 mL/min — ABNORMAL LOW (ref >60)
GLUCOSE: 323 mg/dL — AB (ref 65–99)
Potassium: 4.8 mmol/L (ref 3.5–5.1)
Sodium: 134 mmol/L — ABNORMAL LOW (ref 135–145)

## 2016-03-22 LAB — GLUCOSE, CAPILLARY
GLUCOSE-CAPILLARY: 259 mg/dL — AB (ref 65–99)
GLUCOSE-CAPILLARY: 313 mg/dL — AB (ref 65–99)
Glucose-Capillary: 321 mg/dL — ABNORMAL HIGH (ref 65–99)
Glucose-Capillary: 233 mg/dL — ABNORMAL HIGH (ref 65–99)

## 2016-03-22 LAB — CBC
HCT: 28 % — ABNORMAL LOW (ref 39.0–52.0)
Hemoglobin: 9.5 g/dL — ABNORMAL LOW (ref 13.0–17.0)
MCH: 27.5 pg (ref 26.0–34.0)
MCHC: 33.9 g/dL (ref 30.0–36.0)
MCV: 81.2 fL (ref 78.0–100.0)
Platelets: 67 10*3/uL — ABNORMAL LOW (ref 150–400)
RBC: 3.45 MIL/uL — ABNORMAL LOW (ref 4.22–5.81)
RDW: 17.7 % — AB (ref 11.5–15.5)
WBC: 3.1 10*3/uL — ABNORMAL LOW (ref 4.0–10.5)

## 2016-03-22 LAB — NM MYOCAR MULTI W/SPECT W/WALL MOTION / EF
CHL CUP MPHR: 148 {beats}/min
CHL CUP RESTING HR STRESS: 62 {beats}/min
CSEPEW: 1 METS
Exercise duration (min): 5 min
Peak HR: 81 {beats}/min
Percent HR: 54 %

## 2016-03-22 MED ORDER — REGADENOSON 0.4 MG/5ML IV SOLN
INTRAVENOUS | Status: AC
  Administered 2016-03-22: 0.4 mg via INTRAVENOUS
  Filled 2016-03-22: qty 5

## 2016-03-22 MED ORDER — GABAPENTIN 600 MG PO TABS: 600.0000 mg | ORAL_TABLET | Freq: Two times a day (BID) | ORAL | 0 refills | Status: AC

## 2016-03-22 MED ORDER — ATORVASTATIN CALCIUM 80 MG PO TABS
80.0000 mg | ORAL_TABLET | Freq: Every day | ORAL | Status: DC
  Administered 2016-03-22 – 2016-04-02 (×9): 80 mg via ORAL
  Filled 2016-03-22 (×9): qty 1

## 2016-03-22 MED ORDER — ALBUTEROL SULFATE HFA 108 (90 BASE) MCG/ACT IN AERS: 2.0000 | INHALATION_SPRAY | Freq: Three times a day (TID) | RESPIRATORY_TRACT | 0 refills | Status: AC

## 2016-03-22 MED ORDER — TECHNETIUM TC 99M TETROFOSMIN IV KIT
10.0000 | PACK | Freq: Once | INTRAVENOUS | Status: AC | PRN
  Administered 2016-03-22: 10 via INTRAVENOUS

## 2016-03-22 MED ORDER — PNEUMOCOCCAL VAC POLYVALENT 25 MCG/0.5ML IJ INJ: 0.5000 mL | INJECTION | INTRAMUSCULAR | Status: DC

## 2016-03-22 MED ORDER — LENALIDOMIDE 15 MG PO CAPS: 15.0000 mg | ORAL_CAPSULE | Freq: Every day | ORAL | 0 refills | Status: AC

## 2016-03-22 MED ORDER — REGADENOSON 0.4 MG/5ML IV SOLN
0.4000 mg | Freq: Once | INTRAVENOUS | Status: AC
  Administered 2016-03-22: 0.4 mg via INTRAVENOUS
  Filled 2016-03-22: qty 5

## 2016-03-22 MED ORDER — ALBUTEROL SULFATE (2.5 MG/3ML) 0.083% IN NEBU
2.5000 mg | INHALATION_SOLUTION | Freq: Four times a day (QID) | RESPIRATORY_TRACT | Status: DC | PRN
Start: 2016-03-22 — End: 2016-04-03

## 2016-03-22 MED ORDER — MAGNESIUM OXIDE 420 MG PO TABS: 840.0000 mg | ORAL_TABLET | Freq: Two times a day (BID) | ORAL | 0 refills | Status: AC

## 2016-03-22 MED ORDER — LEVETIRACETAM ER 500 MG PO TB24: 500.0000 mg | ORAL_TABLET | Freq: Every day | ORAL | 0 refills | Status: AC

## 2016-03-22 MED ORDER — FLUTICASONE PROPIONATE 50 MCG/ACT NA SUSP: 2.0000 | Freq: Every day | NASAL | 0 refills | Status: AC | PRN

## 2016-03-22 MED ORDER — TIZANIDINE HCL 4 MG PO TABS: 4.0000 mg | ORAL_TABLET | Freq: Two times a day (BID) | ORAL | 0 refills | Status: AC

## 2016-03-22 MED ORDER — SERTRALINE HCL 50 MG PO TABS: 50.0000 mg | ORAL_TABLET | Freq: Every day | ORAL | 0 refills | Status: AC

## 2016-03-22 MED ORDER — CENTRUM SILVER ADULT 50+ PO TABS: 1.0000 | ORAL_TABLET | Freq: Every day | ORAL | 0 refills | Status: AC

## 2016-03-22 MED ORDER — GABAPENTIN 600 MG PO TABS
600.0000 mg | ORAL_TABLET | Freq: Two times a day (BID) | ORAL | Status: DC
  Administered 2016-03-22 – 2016-04-03 (×16): 600 mg via ORAL
  Filled 2016-03-22 (×16): qty 1

## 2016-03-22 MED ORDER — INSULIN ASPART 100 UNIT/ML ~~LOC~~ SOLN: 0.0000 [IU] | Freq: Three times a day (TID) | SUBCUTANEOUS | 0 refills | Status: AC

## 2016-03-22 MED ORDER — ASPIRIN EC 81 MG PO TBEC: 81.0000 mg | DELAYED_RELEASE_TABLET | Freq: Every day | ORAL | 0 refills | Status: AC

## 2016-03-22 MED ORDER — ACYCLOVIR 200 MG PO CAPS: 200.0000 mg | ORAL_CAPSULE | Freq: Two times a day (BID) | ORAL | 0 refills | Status: AC

## 2016-03-22 MED ORDER — CALCIUM CARBONATE ANTACID 500 MG PO CHEW
1.0000 | CHEWABLE_TABLET | Freq: Four times a day (QID) | ORAL | Status: DC | PRN
  Administered 2016-03-22: 200 mg via ORAL
  Filled 2016-03-22: qty 1

## 2016-03-22 MED ORDER — CARVEDILOL 12.5 MG PO TABS
12.5000 mg | ORAL_TABLET | Freq: Two times a day (BID) | ORAL | Status: DC
  Administered 2016-03-22 – 2016-03-27 (×11): 12.5 mg via ORAL
  Filled 2016-03-22 (×11): qty 1

## 2016-03-22 MED ORDER — LEVOTHYROXINE SODIUM 50 MCG PO TABS: 50.0000 ug | ORAL_TABLET | Freq: Every day | ORAL | 0 refills | Status: AC

## 2016-03-22 NOTE — Progress Notes (Signed)
PROGRESS NOTE        PATIENT DETAILS Name: Douglas Mccoy Age: 71 y.o. Sex: male Date of Birth: 02/28/1945 Admit Date: 03/20/2016 Admitting Physician Waldemar Dickens, MD PCP:[facility]  Brief Narrative: Patient is a 71 y.o. 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  Subjective: Uncomfortably in bed, no chest pain.  Assessment/Plan: Recurrent syncope/exertional chest pain: Known history of CAD, recent echo showed normal LV systolic function. Evaluated by cardiology, underwent nuclear stress test which was positive for ischemia . Cardiology will arrange for cardiac cath next week. cardiac Week  History of hypertension: Blood pressure currently controlled, currently not on any antihypertensive regimen. Follow closely in the outpatient setting  Insulin-dependent type 2 diabetes: A1c is 9.1, continue SSI.   History of CAD status post CABG: Continue aspirin, will need to follow platelet count closely  History of COPD/asthma: Lungs are clear-no evidence of exacerbation, continue usual bronchodilator regimen  Anemia/mild thrombocytopenia: Probably due to underlying multiple myeloma. Cardiology planning on consulting hematology. Continue Revlimid.  History of seizures: Continue Keppra. Patient no longer drives-and he is aware that he is not to drive in the future, without clearance from his primary neurologist.  DVT Prophylaxis:  SCD's  Code Status: Full code  Family Communication: None at bedside  Disposition Plan: Remain inpatient  Antimicrobial agents: Anti-infectives    Start     Dose/Rate Route Frequency Ordered Stop   03/22/16 0000  acyclovir (ZOVIRAX) 200 MG capsule     200 mg Oral 2 times daily 03/22/16 1324     03/16/2016 2200  acyclovir  (ZOVIRAX) 200 MG capsule 200 mg     200 mg Oral 2 times daily 03/27/2016 1531        Procedures: None  CONSULTS:  cardiology  Time spent: 25 minutes-Greater than 50% of this time was spent in counseling, explanation of diagnosis, planning of further management, and coordination of care.  MEDICATIONS: Scheduled Meds: . acyclovir  200 mg Oral BID  . albuterol  2.5 mg Inhalation TID  . aspirin EC  81 mg Oral Daily  . atorvastatin  80 mg Oral q1800  . carvedilol  12.5 mg Oral BID WC  . insulin aspart  0-5 Units Subcutaneous QHS  . insulin aspart  0-9 Units Subcutaneous TID WC  . levETIRAcetam  500 mg Oral Daily  . levothyroxine  50 mcg Oral QAC breakfast  . magnesium oxide  400 mg Oral BID  . sertraline  50 mg Oral Daily  . sodium chloride flush  3 mL Intravenous Q12H   Continuous Infusions: PRN Meds:.acetaminophen **OR** acetaminophen, fluticasone, ondansetron **OR** ondansetron (ZOFRAN) IV   PHYSICAL EXAM: Vital signs: Vitals:   03/22/16 0940 03/22/16 0942 03/22/16 0944 03/22/16 1355  BP: (!) 173/89 (!) 163/77 (!) 169/78 (!) 196/74  Pulse:    82  Resp:    20  Temp:    99 F (37.2 C)  TempSrc:    Oral  SpO2:    100%  Weight:      Height:       Filed Weights   03/15/2016 1325 03/22/16 0359  Weight: 68 kg (150 lb) 68 kg (150 lb)   Body mass index is 24.96  kg/m.   General appearance :Awake, alert, not in any distress. Speech Clear. Eyes:, pupils equally reactive to light and accomodation,no scleral icterus. HEENT: Atraumatic and Normocephalic Neck: supple, no JVD. No cervical lymphadenopathy. Resp:Good air entry bilaterally, no added sounds  CVS: S1 S2 regular, no murmurs.  GI: Bowel sounds present, Non tender and not distended with no gaurding, rigidity or rebound. Extremities: B/L Lower Ext shows no edema, both legs are warm to touch Neurology:  speech clear,Non focal, sensation is grossly intact. Psychiatric: Normal judgment and insight. Alert and oriented x 3.  Normal mood. Musculoskeletal:No digital cyanosis Skin:No Rash, warm and dry Wounds:N/A  I have personally reviewed following labs and imaging studies  LABORATORY DATA: CBC:  Recent Labs Lab 03/17/16 1504 03/19/16 1550 03/20/16 0441 03/17/2016 1436 03/22/16 0314  WBC  --  4.1 3.4* 4.6 3.1*  NEUTROABS  --  2.5  --  3.6  --   HGB 9.5* 9.2* 9.0* 9.6* 9.5*  HCT 28.0* 27.7* 27.4* 29.4* 28.0*  MCV  --  82.2 82.3 81.9 81.2  PLT  --  72* 64* 65* 67*    Basic Metabolic Panel:  Recent Labs Lab 03/17/16 1504 03/19/16 1550 03/20/16 0441 03/22/2016 1436 03/22/16 0314  NA 135 136 135 136 134*  K 5.5* 4.8 4.4 4.6 4.8  CL 102 106 104 104 104  CO2  --  22 22 23  20*  GLUCOSE 374* 155* 192* 305* 323*  BUN 33* 22* 19 17 24*  CREATININE 1.40* 1.28* 1.24 1.41* 1.34*  CALCIUM  --  9.0 9.1 8.6* 8.7*    GFR: Estimated Creatinine Clearance: 43.3 mL/min (by C-G formula based on SCr of 1.34 mg/dL (H)).  Liver Function Tests:  Recent Labs Lab 03/19/16 1550 03/20/16 0441  AST 19 17  ALT 16* 16*  ALKPHOS 57 54  BILITOT 1.2 0.8  PROT 6.3* 6.0*  ALBUMIN 3.5 3.2*   No results for input(s): LIPASE, AMYLASE in the last 168 hours. No results for input(s): AMMONIA in the last 168 hours.  Coagulation Profile: No results for input(s): INR, PROTIME in the last 168 hours.  Cardiac Enzymes: No results for input(s): CKTOTAL, CKMB, CKMBINDEX, TROPONINI in the last 168 hours.  BNP (last 3 results) No results for input(s): PROBNP in the last 8760 hours.  HbA1C:  Recent Labs  03/20/16 0441  HGBA1C 9.1*    CBG:  Recent Labs Lab 03/15/2016 1858 03/27/2016 2235 03/22/16 0638 03/22/16 1141 03/22/16 1653  GLUCAP 336* 363* 259* 321* 313*    Lipid Profile: No results for input(s): CHOL, HDL, LDLCALC, TRIG, CHOLHDL, LDLDIRECT in the last 72 hours.  Thyroid Function Tests: No results for input(s): TSH, T4TOTAL, FREET4, T3FREE, THYROIDAB in the last 72 hours.  Anemia Panel: No results  for input(s): VITAMINB12, FOLATE, FERRITIN, TIBC, IRON, RETICCTPCT in the last 72 hours.  Urine analysis:    Component Value Date/Time   COLORURINE STRAW (A) 03/14/2016 1427   APPEARANCEUR CLEAR 03/14/2016 1427   LABSPEC 1.014 03/14/2016 1427   PHURINE 7.0 03/14/2016 1427   GLUCOSEU >=500 (A) 03/14/2016 1427   HGBUR NEGATIVE 03/14/2016 1427   BILIRUBINUR NEGATIVE 03/14/2016 1427   KETONESUR NEGATIVE 03/14/2016 1427   PROTEINUR NEGATIVE 03/14/2016 1427   NITRITE NEGATIVE 03/14/2016 1427   LEUKOCYTESUR NEGATIVE 03/14/2016 1427    Sepsis Labs: Lactic Acid, Venous    Component Value Date/Time   LATICACIDVEN 1.85 01/10/2016 1129    MICROBIOLOGY: No results found for this or any previous visit (from the past 240  hour(s)).  RADIOLOGY STUDIES/RESULTS: X-ray Chest Pa And Lateral  Result Date: 03/19/2016 CLINICAL DATA:  Respiratory crackles, pneumonia 2 weeks ago. Hypotensive. History of COPD, CABG, hypertension. EXAM: CHEST  2 VIEW COMPARISON:  Chest radiograph March 14, 2016 FINDINGS: Cardiac silhouette is upper limits normal size, calcified tortuous aorta. Status post median sternotomy for CABG with fractured midsternal heroin. Chronically elevated RIGHT hemidiaphragm. No pleural effusion or focal consolidation. No pneumothorax. Soft tissue planes included osseous structures are nonsuspicious. Single level mid thoracic vertebral body cement augmentation. Mild old approximate T11 compression fracture. Surgical clips in the included right abdomen compatible with cholecystectomy. IMPRESSION: Stable examination: Borderline cardiomegaly, no acute pulmonary process. Electronically Signed   By: Elon Alas M.D.   On: 03/19/2016 19:20   Nm Myocar Multi W/spect W/wall Motion / Ef  Result Date: 03/22/2016 CLINICAL DATA:  71 year old male with abnormal EKG EXAM: MYOCARDIAL IMAGING WITH SPECT (REST AND PHARMACOLOGIC-STRESS) GATED LEFT VENTRICULAR WALL MOTION STUDY LEFT VENTRICULAR EJECTION  FRACTION TECHNIQUE: Standard myocardial SPECT imaging was performed after resting intravenous injection of 10 mCi Tc-47mtetrofosmin. Subsequently, intravenous infusion of Lexiscan was performed under the supervision of the Cardiology staff. At peak effect of the drug, 30 mCi Tc-919metrofosmin was injected intravenously and standard myocardial SPECT imaging was performed. Quantitative gated imaging was also performed to evaluate left ventricular wall motion, and estimate left ventricular ejection fraction. COMPARISON:  Prior chest x-ray 03/19/2016 FINDINGS: Perfusion: Moderate region of the decreased radiotracer activity involving the mid and apical inferior and inferolateral wall consistent with reversible ischemia. Wall Motion: Normal left ventricular wall motion. No left ventricular dilation. Left Ventricular Ejection Fraction: 53 % End diastolic volume 96 ml End systolic volume 45 ml IMPRESSION: 1. Positive for a moderate region of reversible ischemia in the inferior and inferolateral walls of the mid and apical left ventricle. 2. Normal left ventricular wall motion. 3. Left ventricular ejection fraction 53% 4. Non invasive risk stratification*: Intermediate *2012 Appropriate Use Criteria for Coronary Revascularization Focused Update: J Am Coll Cardiol. 207741;42(3):[email or web address] Electronically Signed   By: HeJacqulynn Cadet.D.   On: 03/22/2016 14:58   Dg Chest Port 1 View  Result Date: 03/14/2016 CLINICAL DATA:  Sepsis. EXAM: PORTABLE CHEST 1 VIEW COMPARISON:  Radiographs of January 11, 2016. FINDINGS: The heart size and mediastinal contours are within normal limits. Status post coronary bypass graft. Atherosclerosis of thoracic aorta is noted. No pneumothorax or pleural effusion is noted. Elevated right hemidiaphragm is noted. Both lungs are clear. The visualized skeletal structures are unremarkable. IMPRESSION: Aortic atherosclerosis.  No acute  cardiopulmonary abnormality seen. Electronically Signed   By: JaMarijo ConceptionM.D.   On: 03/14/2016 14:00     LOS: 0 days   GHOren BinetMD  [facility] Pager:[phone] 7PM-7AM, please contact night-coverage [email or web address] Password [email or web address] 03/22/2016, 5:10 PM

## 2016-03-22 NOTE — Progress Notes (Addendum)
LM with [place]. [phone] [id number]. Notified patient may DC as early as tonight pending results from stress test and application of loop recorder. CM contact information provided.  13:30 Notified by Dr Sloan Leiter that patient states per Trinidad Curet at [facility] he will not be accepted back. Patient ready for DC today. Also received call back from [facility]. Monique updated that plan is for discharge today, patient was not admitted to inpatient bed, was in obs and is clinically ready for discharge. CM updated Monique that [facility], patient appropriate for DC. Beckie Busing stated she would contact [facility] who was concerned patient has several recent visits to ED, that he is medically cleared to return. CM waiting to hear back from [facility] that patient accepted at [facility]. Spoke with Trinidad Curet from [facility] at the bedside with patient. Patient accepted back today. Patient will need cab voucher, CSW updated. Dr Sloan Leiter updated. DC pending stress test results. Patient updated.  17:47 LM with Monique to update on plan from this evening to do cath on Monday. Patient verbalizes understanding of plan and unable to contact Neoma Laming after hours at [employer]. CM will continue to follow.

## 2016-03-22 NOTE — Progress Notes (Signed)
Patient Name: Douglas Mccoy Date of Encounter: 03/22/2016  Primary Cardiologist: new- Dr. [facility] Problem List     Principal Problem:   Syncope Active Problems:   Type II diabetes mellitus with peripheral circulatory disorder (HCC)   Hypertension   COPD (chronic obstructive pulmonary disease) (HCC)   Essential hypertension, malignant   CAD (coronary artery disease)   Anemia     Subjective   No chest pain or SOB. No recurrent syncope.  Inpatient Medications    Scheduled Meds: . acyclovir  200 mg Oral BID  . albuterol  2.5 mg Inhalation TID  . aspirin EC  81 mg Oral Daily  . insulin aspart  0-5 Units Subcutaneous QHS  . insulin aspart  0-9 Units Subcutaneous TID WC  . levETIRAcetam  500 mg Oral Daily  . levothyroxine  50 mcg Oral QAC breakfast  . magnesium oxide  400 mg Oral BID  . sertraline  50 mg Oral Daily  . sodium chloride flush  3 mL Intravenous Q12H   Continuous Infusions:  PRN Meds: acetaminophen **OR** acetaminophen, fluticasone, ondansetron **OR** ondansetron (ZOFRAN) IV   Vital Signs    Vitals:   03/22/16 0940 03/22/16 0942 03/22/16 0944 03/22/16 1355  BP: (!) 173/89 (!) 163/77 (!) 169/78 (!) 196/74  Pulse:    82  Resp:    20  Temp:    99 F (37.2 C)  TempSrc:    Oral  SpO2:    100%  Weight:      Height:        Intake/Output Summary (Last 24 hours) at 03/22/16 1520 Last data filed at 03/22/16 1435  Gross per 24 hour  Intake              740 ml  Output                0 ml  Net              740 ml   Filed Weights   03/19/2016 1325 03/22/16 0359  Weight: 150 lb (68 kg) 150 lb (68 kg)    Physical Exam   GEN: Well nourished, well developed, in no acute distress.  HEENT: Grossly normal.  Neck: Supple, no JVD, carotid bruits, or masses. Cardiac: RRR,  Murmur in left lower sternal border, rubs, or gallops. No clubbing, cyanosis, edema.  Radials/DP/PT 2+ and equal bilaterally.  Respiratory:  Respirations regular and unlabored, clear  to auscultation bilaterally. GI: Soft, nontender, nondistended, BS + x 4. MS: no deformity or atrophy. Skin: warm and dry, no rash. Neuro:  Strength and sensation are intact. Psych: AAOx3.  Normal affect.  Labs    CBC  Recent Labs  03/19/16 1550  03/31/2016 1436 03/22/16 0314  WBC 4.1  < > 4.6 3.1*  NEUTROABS 2.5  --  3.6  --   HGB 9.2*  < > 9.6* 9.5*  HCT 27.7*  < > 29.4* 28.0*  MCV 82.2  < > 81.9 81.2  PLT 72*  < > 65* 67*  < > = values in this interval not displayed. Basic Metabolic Panel  Recent Labs  04/04/2016 1436 03/22/16 0314  NA 136 134*  K 4.6 4.8  CL 104 104  CO2 23 20*  GLUCOSE 305* 323*  BUN 17 24*  CREATININE 1.41* 1.34*  CALCIUM 8.6* 8.7*   Liver Function Tests  Recent Labs  03/19/16 1550 03/20/16 0441  AST 19 17  ALT 16* 16*  ALKPHOS 57 54  BILITOT 1.2 0.8  PROT 6.3* 6.0*  ALBUMIN 3.5 3.2*   No results for input(s): LIPASE, AMYLASE in the last 72 hours. Cardiac Enzymes No results for input(s): CKTOTAL, CKMB, CKMBINDEX, TROPONINI in the last 72 hours. BNP Invalid input(s): POCBNP D-Dimer No results for input(s): DDIMER in the last 72 hours. Hemoglobin A1C  Recent Labs  03/20/16 0441  HGBA1C 9.1*   Fasting Lipid Panel No results for input(s): CHOL, HDL, LDLCALC, TRIG, CHOLHDL, LDLDIRECT in the last 72 hours. Thyroid Function Tests  Recent Labs  03/19/16 1550  TSH 5.763*    Telemetry    nsr- Personally Reviewed  ECG    nsr with PAC - Personally Reviewed  Radiology    Nm Myocar Multi W/spect W/wall Motion / Ef  Result Date: 03/22/2016 CLINICAL DATA:  71 year old male with abnormal EKG EXAM: MYOCARDIAL IMAGING WITH SPECT (REST AND PHARMACOLOGIC-STRESS) GATED LEFT VENTRICULAR WALL MOTION STUDY LEFT VENTRICULAR EJECTION FRACTION TECHNIQUE: Standard myocardial SPECT imaging was performed after resting intravenous injection of 10 mCi Tc-56mtetrofosmin. Subsequently, intravenous infusion of Lexiscan was performed under the  supervision of the Cardiology staff. At peak effect of the drug, 30 mCi Tc-9110metrofosmin was injected intravenously and standard myocardial SPECT imaging was performed. Quantitative gated imaging was also performed to evaluate left ventricular wall motion, and estimate left ventricular ejection fraction. COMPARISON:  Prior chest x-ray 03/19/2016 FINDINGS: Perfusion: Moderate region of the decreased radiotracer activity involving the mid and apical inferior and inferolateral wall consistent with reversible ischemia. Wall Motion: Normal left ventricular wall motion. No left ventricular dilation. Left Ventricular Ejection Fraction: 53 % End diastolic volume 96 ml End systolic volume 45 ml IMPRESSION: 1. Positive for a moderate region of reversible ischemia in the inferior and inferolateral walls of the mid and apical left ventricle. 2. Normal left ventricular wall motion. 3. Left ventricular ejection fraction 53% 4. Non invasive risk stratification*: Intermediate *2012 Appropriate Use Criteria for Coronary Revascularization Focused Update: J Am Coll Cardiol. 203382;50(5):397-673http://[email or web address]?[email or web address] Electronically Signed   By: HeJacqulynn Cadet.D.   On: 03/22/2016 14:58    Cardiac Studies   Nuclear stress test 03/22/16 IMPRESSION: 1. Positive for a moderate region of reversible ischemia in the inferior and inferolateral walls of the mid and apical left ventricle. 2. Normal left ventricular wall motion. 3. Left ventricular ejection fraction 53% 4. Non invasive risk stratification*: Intermediate  Patient Profile     Douglas Mccoy a [age].o. 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  Assessment & Plan    Recurrent syncope: unclear if he has autonomic neuropathy in  context of poorly controlled diabetes, with consequent hypotension and perhaps vasovagal syncope. Orthostatics negative this admission. Possibly syncope, arising from aberrant vagotonic reflex stimulated. Speech therapy has signed off. He will likely need some form of cardiac monitoring as an outpatient.   Exertional CP/SOB: nuc with reversible ischemia. Will plan for heart cath on Monday. Discussed with Dr. [facility] who said as long as PLT count >50 okay for cath with DAPT. She suggested a possible BMS with a shorter duration of DAPT if needed.  CAD s/p CABG (2004): no follow up since the time of his bypass surgery. Continue ASA and BB. I will also add a statin   HTN: BP are labile. BP uncontrolled today. Home antihypertensives held given syncope and  soft pressures on arrival. Apparently was on clonidine as an outpatient. Would not use this medication. Will add Coreg 12.15m BID (HR in 80s).   HLD: continue statin   DMT2: uncontrolled HgA1c 9.1.   CKD: watch renal function closely in the setting of cath   Thrombocytopenia with a hx of MM: PLT around 69. Discussed with Dr. [facility] who will arrange for hematology outpatient follow up.   Signed, KAngelena Form PA-C  03/22/2016, 3:20 PM

## 2016-03-22 NOTE — Progress Notes (Signed)
   Nuclear stress test completed. Results pending. Full cardiology progress note to follow.  Douglas Mccoy 03/22/2016

## 2016-03-22 NOTE — Progress Notes (Signed)
SLP Note  Patient Details Name: Taequan Stockhausen MRN: [id number] DOB: 12/23/1945   Referral received for swallow evaluation. Upon research, swallow syncope, arising from aberrant vagotonic reflex stimulated during esophageal portion of the swallow, is more appropriately evaluated by cardio and GI.  Our services will sign off at this time.            Juan Quam Laurice 03/22/2016, 10:23 AM

## 2016-03-23 LAB — GLUCOSE, CAPILLARY
GLUCOSE-CAPILLARY: 213 mg/dL — AB (ref 65–99)
Glucose-Capillary: 345 mg/dL — ABNORMAL HIGH (ref 65–99)
Glucose-Capillary: 219 mg/dL — ABNORMAL HIGH (ref 65–99)
Glucose-Capillary: 276 mg/dL — ABNORMAL HIGH (ref 65–99)

## 2016-03-23 MED ORDER — LOPERAMIDE HCL 2 MG PO CAPS
2.0000 mg | ORAL_CAPSULE | ORAL | Status: DC | PRN
  Administered 2016-03-23 – 2016-03-28 (×8): 2 mg via ORAL
  Filled 2016-03-23 (×7): qty 1

## 2016-03-23 MED ORDER — INSULIN GLARGINE 100 UNIT/ML ~~LOC~~ SOLN
10.0000 [IU] | Freq: Every day | SUBCUTANEOUS | Status: DC
  Administered 2016-03-23 – 2016-03-24 (×2): 10 [IU] via SUBCUTANEOUS
  Filled 2016-03-23 (×2): qty 0.1

## 2016-03-23 NOTE — Progress Notes (Signed)
DAILY PROGRESS NOTE  Subjective:  No events overnight - denies chest pain. Plan for [facility] on Monday.  Objective:  Temp:  [97.9 F (36.6 C)-99 F (37.2 C)] 97.9 F (36.6 C) (03/10 0500) Pulse Rate:  [63-82] 65 (03/10 0500) Resp:  [17-20] 17 (03/10 0500) BP: (150-196)/(60-74) 162/67 (03/10 0500) SpO2:  [95 %-100 %] 98 % (03/10 0500) Weight:  [148 lb 8 oz (67.4 kg)] 148 lb 8 oz (67.4 kg) (03/10 0500) Weight change: -1 lb 8 oz (-0.68 kg)  Intake/Output from previous day: 03/09 0701 - 03/10 0700 In: 720 [P.O.:720] Out: -   Intake/Output from this shift: No intake/output data recorded.  Medications: No current facility-administered medications on file prior to encounter.    No current outpatient prescriptions on file prior to encounter.    Physical Exam: General appearance: alert and no distress Lungs: clear to auscultation bilaterally Heart: regular rate and rhythm Extremities: extremities normal, atraumatic, no cyanosis or edema Neurologic: Grossly normal  Lab Results: Results for orders placed or performed during the hospital encounter of 03/31/2016 (from the past 48 hour(s))  Basic metabolic panel     Status: Abnormal   Collection Time: 04/11/2016  2:36 PM  Result Value Ref Range   Sodium 136 135 - 145 mmol/L   Potassium 4.6 3.5 - 5.1 mmol/L   Chloride 104 101 - 111 mmol/L   CO2 23 22 - 32 mmol/L   Glucose, Bld 305 (H) 65 - 99 mg/dL   BUN 17 6 - 20 mg/dL   Creatinine, Ser 1.41 (H) 0.61 - 1.24 mg/dL   Calcium 8.6 (L) 8.9 - 10.3 mg/dL   GFR calc non Af Amer 48 (L) >60 mL/min   GFR calc Af Amer 56 (L) >60 mL/min    Comment: (NOTE) The eGFR has been calculated using the CKD EPI equation. This calculation has not been validated in all clinical situations. eGFR's persistently <60 mL/min signify possible Chronic Kidney Disease.    Anion gap 9 5 - 15  CBC with Differential     Status: Abnormal   Collection Time: 04/11/2016  2:36 PM  Result Value Ref Range   WBC 4.6  4.0 - 10.5 K/uL   RBC 3.59 (L) 4.22 - 5.81 MIL/uL   Hemoglobin 9.6 (L) 13.0 - 17.0 g/dL   HCT 29.4 (L) 39.0 - 52.0 %   MCV 81.9 78.0 - 100.0 fL   MCH 26.7 26.0 - 34.0 pg   MCHC 32.7 30.0 - 36.0 g/dL   RDW 17.6 (H) 11.5 - 15.5 %   Platelets 65 (L) 150 - 400 K/uL    Comment: REPEATED TO VERIFY CONSISTENT WITH PREVIOUS RESULT    Neutrophils Relative % 80 %   Neutro Abs 3.6 1.7 - 7.7 K/uL   Lymphocytes Relative 14 %   Lymphs Abs 0.7 0.7 - 4.0 K/uL   Monocytes Relative 6 %   Monocytes Absolute 0.3 0.1 - 1.0 K/uL   Eosinophils Relative 0 %   Eosinophils Absolute 0.0 0.0 - 0.7 K/uL   Basophils Relative 0 %   Basophils Absolute 0.0 0.0 - 0.1 K/uL  I-stat troponin, ED     Status: None   Collection Time: 03/26/2016  3:02 PM  Result Value Ref Range   Troponin i, poc 0.08 0.00 - 0.08 ng/mL   Comment 3            Comment: Due to the release kinetics of cTnI, a negative result within the first hours of the onset  of symptoms does not rule out myocardial infarction with certainty. If myocardial infarction is still suspected, repeat the test at appropriate intervals.   Glucose, capillary     Status: Abnormal   Collection Time: 04/06/2016  6:58 PM  Result Value Ref Range   Glucose-Capillary 336 (H) 65 - 99 mg/dL   Comment 1 Notify RN    Comment 2 Document in Chart   Glucose, capillary     Status: Abnormal   Collection Time: 03/16/2016 10:35 PM  Result Value Ref Range   Glucose-Capillary 363 (H) 65 - 99 mg/dL  Basic metabolic panel     Status: Abnormal   Collection Time: 03/22/16  3:14 AM  Result Value Ref Range   Sodium 134 (L) 135 - 145 mmol/L   Potassium 4.8 3.5 - 5.1 mmol/L   Chloride 104 101 - 111 mmol/L   CO2 20 (L) 22 - 32 mmol/L   Glucose, Bld 323 (H) 65 - 99 mg/dL   BUN 24 (H) 6 - 20 mg/dL   Creatinine, Ser 1.34 (H) 0.61 - 1.24 mg/dL   Calcium 8.7 (L) 8.9 - 10.3 mg/dL   GFR calc non Af Amer 51 (L) >60 mL/min   GFR calc Af Amer 59 (L) >60 mL/min    Comment: (NOTE) The eGFR  has been calculated using the CKD EPI equation. This calculation has not been validated in all clinical situations. eGFR's persistently <60 mL/min signify possible Chronic Kidney Disease.    Anion gap 10 5 - 15  CBC     Status: Abnormal   Collection Time: 03/22/16  3:14 AM  Result Value Ref Range   WBC 3.1 (L) 4.0 - 10.5 K/uL   RBC 3.45 (L) 4.22 - 5.81 MIL/uL   Hemoglobin 9.5 (L) 13.0 - 17.0 g/dL   HCT 28.0 (L) 39.0 - 52.0 %   MCV 81.2 78.0 - 100.0 fL   MCH 27.5 26.0 - 34.0 pg   MCHC 33.9 30.0 - 36.0 g/dL   RDW 17.7 (H) 11.5 - 15.5 %   Platelets 67 (L) 150 - 400 K/uL    Comment: REPEATED TO VERIFY SPECIMEN CHECKED FOR CLOTS PLATELET COUNT CONFIRMED BY SMEAR   Glucose, capillary     Status: Abnormal   Collection Time: 03/22/16  6:38 AM  Result Value Ref Range   Glucose-Capillary 259 (H) 65 - 99 mg/dL  Glucose, capillary     Status: Abnormal   Collection Time: 03/22/16 11:41 AM  Result Value Ref Range   Glucose-Capillary 321 (H) 65 - 99 mg/dL   Comment 1 Notify RN   Glucose, capillary     Status: Abnormal   Collection Time: 03/22/16  4:53 PM  Result Value Ref Range   Glucose-Capillary 313 (H) 65 - 99 mg/dL   Comment 1 Notify RN   Glucose, capillary     Status: Abnormal   Collection Time: 03/22/16  9:00 PM  Result Value Ref Range   Glucose-Capillary 233 (H) 65 - 99 mg/dL  Glucose, capillary     Status: Abnormal   Collection Time: 03/23/16  6:30 AM  Result Value Ref Range   Glucose-Capillary 213 (H) 65 - 99 mg/dL    Imaging: Nm Myocar Multi W/spect W/wall Motion / Ef  Result Date: 03/22/2016 CLINICAL DATA:  71 year old male with abnormal EKG EXAM: MYOCARDIAL IMAGING WITH SPECT (REST AND PHARMACOLOGIC-STRESS) GATED LEFT VENTRICULAR WALL MOTION STUDY LEFT VENTRICULAR EJECTION FRACTION TECHNIQUE: Standard myocardial SPECT imaging was performed after resting intravenous injection of 10 mCi Tc-69m  tetrofosmin. Subsequently, intravenous infusion of Lexiscan was performed under  the supervision of the Cardiology staff. At peak effect of the drug, 30 mCi Tc-64mtetrofosmin was injected intravenously and standard myocardial SPECT imaging was performed. Quantitative gated imaging was also performed to evaluate left ventricular wall motion, and estimate left ventricular ejection fraction. COMPARISON:  Prior chest x-ray 03/19/2016 FINDINGS: Perfusion: Moderate region of the decreased radiotracer activity involving the mid and apical inferior and inferolateral wall consistent with reversible ischemia. Wall Motion: Normal left ventricular wall motion. No left ventricular dilation. Left Ventricular Ejection Fraction: 53 % End diastolic volume 96 ml End systolic volume 45 ml IMPRESSION: 1. Positive for a moderate region of reversible ischemia in the inferior and inferolateral walls of the mid and apical left ventricle. 2. Normal left ventricular wall motion. 3. Left ventricular ejection fraction 53% 4. Non invasive risk stratification*: Intermediate *2012 Appropriate Use Criteria for Coronary Revascularization Focused Update: J Am Coll Cardiol. 28182;99(3):716-967 http://content.[email or web address]?articleid=[email or web address] Electronically Signed   By: HJacqulynn CadetM.D.   On: 03/22/2016 14:58    Assessment:  1. Principal Problem: 2.   Syncope 3. Active Problems: 4.   Type II diabetes mellitus with peripheral circulatory disorder (HCC) 5.   Hypertension 6.   COPD (chronic obstructive pulmonary disease) ([place] 7.   Essential hypertension, malignant 8.   CAD (coronary artery disease) 9.   Anemia 10.   [facility]  [facility] possibly playing a role in syncope. Would hold on loop recorder for now while pursuing an ischemic cause. Creatinine hovering around 1.3.  Time Spent Directly with Patient:  15 minutes  Length of Stay:  LOS: 1 day   KPixie Casino MD, FRegency Hospital Of Northwest IndianaAttending Cardiologist CZelienople3/10/2016, 10:41  AM

## 2016-03-23 NOTE — Progress Notes (Signed)
PROGRESS NOTE        PATIENT DETAILS Name: Douglas Mccoy Age: 71 y.o. Sex: male Date of Birth: [birth year] Admit Date: 03/27/2016 Admitting Physician Waldemar Dickens, MD PCP:[facility]  Brief Narrative: Patient is a 71 y.o. 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  Subjective: No chest pain.  Assessment/Plan: Recurrent syncope/exertional chest pain: Known history of CAD, recent echo showed normal LV systolic function. Evaluated by cardiology, underwent nuclear stress test which was positive for ischemia . Cardiology will arrange for cardiac cath next week.  History of hypertension: Blood pressure currently controlled, currently not on any antihypertensive regimen. Follow closely in the outpatient setting  Insulin-dependent type 2 diabetes: A1c is 9.1, CBGs uncontrolled-start Lantus 10 units, continue SSI and follow.   History of CAD status post CABG: Continue aspirin, will need to follow platelet count closely  History of COPD/asthma: Lungs are clear-no evidence of exacerbation, continue usual bronchodilator regimen  Anemia/mild thrombocytopenia: Probably due to underlying multiple myeloma. Cardiology planning on consulting hematology. Continue Revlimid.  History of seizures: Continue Keppra. Patient no longer drives-and he is aware that he is not to drive in the future, without clearance from his primary neurologist.  DVT Prophylaxis:  SCD's  Code Status: Full code  Family Communication: None at bedside  Disposition Plan: Remain inpatient  Antimicrobial agents: Anti-infectives    Start     Dose/Rate Route Frequency Ordered Stop   03/22/16 0000  acyclovir (ZOVIRAX) 200 MG capsule     200 mg Oral 2 times daily 03/22/16 1324     04/05/2016 2200   acyclovir (ZOVIRAX) 200 MG capsule 200 mg     200 mg Oral 2 times daily 03/27/2016 1531        Procedures: None  CONSULTS:  cardiology  Time spent: 25 minutes-Greater than 50% of this time was spent in counseling, explanation of diagnosis, planning of further management, and coordination of care.  MEDICATIONS: Scheduled Meds: . acyclovir  200 mg Oral BID  . aspirin EC  81 mg Oral Daily  . atorvastatin  80 mg Oral q1800  . carvedilol  12.5 mg Oral BID WC  . gabapentin  600 mg Oral BID  . insulin aspart  0-5 Units Subcutaneous QHS  . insulin aspart  0-9 Units Subcutaneous TID WC  . levETIRAcetam  500 mg Oral Daily  . levothyroxine  50 mcg Oral QAC breakfast  . magnesium oxide  400 mg Oral BID  . pneumococcal 23 valent vaccine  0.5 mL Intramuscular Tomorrow-1000  . sertraline  50 mg Oral Daily  . sodium chloride flush  3 mL Intravenous Q12H   Continuous Infusions: PRN Meds:.acetaminophen **OR** acetaminophen, albuterol, calcium carbonate, fluticasone, loperamide, ondansetron **OR** ondansetron (ZOFRAN) IV   PHYSICAL EXAM: Vital signs: Vitals:   03/22/16 1355 03/22/16 2012 03/22/16 2027 03/23/16 0500  BP: (!) 196/74  (!) 150/60 (!) 162/67  Pulse: 82  63 65  Resp: 20  18 17   Temp: 99 F (37.2 C)  98.6 F (37 C) 97.9 F (36.6 C)  TempSrc: Oral  Oral Oral  SpO2: 100% 95% 98% 98%  Weight:    67.4 kg (148 lb 8 oz)  Height:  Filed Weights   04/11/2016 1325 03/22/16 0359 03/23/16 0500  Weight: 68 kg (150 lb) 68 kg (150 lb) 67.4 kg (148 lb 8 oz)   Body mass index is 24.71 kg/m.   General appearance :Awake, alert, not in any distress. Speech Clear. Eyes:, pupils equally reactive to light and accomodation,no scleral icterus. HEENT: Atraumatic and Normocephalic Neck: supple, no JVD. No cervical lymphadenopathy. Resp:Good air entry bilaterally, no added sounds  CVS: S1 S2 regular, no murmurs.  GI: Bowel sounds present, Non tender and not distended with no gaurding,  rigidity or rebound. Extremities: B/L Lower Ext shows no edema, both legs are warm to touch Neurology:  speech clear,Non focal, sensation is grossly intact. Psychiatric: Normal judgment and insight. Alert and oriented x 3. Normal mood. Musculoskeletal:No digital cyanosis Skin:No Rash, warm and dry Wounds:N/A  I have personally reviewed following labs and imaging studies  LABORATORY DATA: CBC:  Recent Labs Lab 03/17/16 1504 03/19/16 1550 03/20/16 0441 03/18/2016 1436 03/22/16 0314  WBC  --  4.1 3.4* 4.6 3.1*  NEUTROABS  --  2.5  --  3.6  --   HGB 9.5* 9.2* 9.0* 9.6* 9.5*  HCT 28.0* 27.7* 27.4* 29.4* 28.0*  MCV  --  82.2 82.3 81.9 81.2  PLT  --  72* 64* 65* 67*    Basic Metabolic Panel:  Recent Labs Lab 03/17/16 1504 03/19/16 1550 03/20/16 0441 04/11/2016 1436 03/22/16 0314  NA 135 136 135 136 134*  K 5.5* 4.8 4.4 4.6 4.8  CL 102 106 104 104 104  CO2  --  22 22 23  20*  GLUCOSE 374* 155* 192* 305* 323*  BUN 33* 22* 19 17 24*  CREATININE 1.40* 1.28* 1.24 1.41* 1.34*  CALCIUM  --  9.0 9.1 8.6* 8.7*    GFR: Estimated Creatinine Clearance: 43.3 mL/min (by C-G formula based on SCr of 1.34 mg/dL (H)).  Liver Function Tests:  Recent Labs Lab 03/19/16 1550 03/20/16 0441  AST 19 17  ALT 16* 16*  ALKPHOS 57 54  BILITOT 1.2 0.8  PROT 6.3* 6.0*  ALBUMIN 3.5 3.2*   No results for input(s): LIPASE, AMYLASE in the last 168 hours. No results for input(s): AMMONIA in the last 168 hours.  Coagulation Profile: No results for input(s): INR, PROTIME in the last 168 hours.  Cardiac Enzymes: No results for input(s): CKTOTAL, CKMB, CKMBINDEX, TROPONINI in the last 168 hours.  BNP (last 3 results) No results for input(s): PROBNP in the last 8760 hours.  HbA1C: No results for input(s): HGBA1C in the last 72 hours.  CBG:  Recent Labs Lab 03/22/16 0638 03/22/16 1141 03/22/16 1653 03/22/16 2100 03/23/16 0630  GLUCAP 259* 321* 313* 233* 213*    Lipid Profile: No  results for input(s): CHOL, HDL, LDLCALC, TRIG, CHOLHDL, LDLDIRECT in the last 72 hours.  Thyroid Function Tests: No results for input(s): TSH, T4TOTAL, FREET4, T3FREE, THYROIDAB in the last 72 hours.  Anemia Panel: No results for input(s): VITAMINB12, FOLATE, FERRITIN, TIBC, IRON, RETICCTPCT in the last 72 hours.  Urine analysis:    Component Value Date/Time   COLORURINE STRAW (A) 03/14/2016 1427   APPEARANCEUR CLEAR 03/14/2016 1427   LABSPEC 1.014 03/14/2016 1427   PHURINE 7.0 03/14/2016 1427   GLUCOSEU >=500 (A) 03/14/2016 1427   HGBUR NEGATIVE 03/14/2016 1427   BILIRUBINUR NEGATIVE 03/14/2016 1427   KETONESUR NEGATIVE 03/14/2016 1427   PROTEINUR NEGATIVE 03/14/2016 1427   NITRITE NEGATIVE 03/14/2016 1427   LEUKOCYTESUR NEGATIVE 03/14/2016 1427    Sepsis Labs:  Lactic Acid, Venous    Component Value Date/Time   LATICACIDVEN 1.85 01/10/2016 1129    MICROBIOLOGY: No results found for this or any previous visit (from the past 240 hour(s)).  RADIOLOGY STUDIES/RESULTS: X-ray Chest Pa And Lateral  Result Date: 03/19/2016 CLINICAL DATA:  Respiratory crackles, pneumonia 2 weeks ago. Hypotensive. History of COPD, CABG, hypertension. EXAM: CHEST  2 VIEW COMPARISON:  Chest radiograph March 14, 2016 FINDINGS: Cardiac silhouette is upper limits normal size, calcified tortuous aorta. Status post median sternotomy for CABG with fractured midsternal heroin. Chronically elevated RIGHT hemidiaphragm. No pleural effusion or focal consolidation. No pneumothorax. Soft tissue planes included osseous structures are nonsuspicious. Single level mid thoracic vertebral body cement augmentation. Mild old approximate T11 compression fracture. Surgical clips in the included right abdomen compatible with cholecystectomy. IMPRESSION: Stable examination: Borderline cardiomegaly, no acute pulmonary process. Electronically Signed   By: Elon Alas M.D.   On: 03/19/2016 19:20   Nm Myocar Multi W/spect  W/wall Motion / Ef  Result Date: 03/22/2016 CLINICAL DATA:  71 year old male with abnormal EKG EXAM: MYOCARDIAL IMAGING WITH SPECT (REST AND PHARMACOLOGIC-STRESS) GATED LEFT VENTRICULAR WALL MOTION STUDY LEFT VENTRICULAR EJECTION FRACTION TECHNIQUE: Standard myocardial SPECT imaging was performed after resting intravenous injection of 10 mCi Tc-37mtetrofosmin. Subsequently, intravenous infusion of Lexiscan was performed under the supervision of the Cardiology staff. At peak effect of the drug, 30 mCi Tc-92metrofosmin was injected intravenously and standard myocardial SPECT imaging was performed. Quantitative gated imaging was also performed to evaluate left ventricular wall motion, and estimate left ventricular ejection fraction. COMPARISON:  Prior chest x-ray 03/19/2016 FINDINGS: Perfusion: Moderate region of the decreased radiotracer activity involving the mid and apical inferior and inferolateral wall consistent with reversible ischemia. Wall Motion: Normal left ventricular wall motion. No left ventricular dilation. Left Ventricular Ejection Fraction: 53 % End diastolic volume 96 ml End systolic volume 45 ml IMPRESSION: 1. Positive for a moderate region of reversible ischemia in the inferior and inferolateral walls of the mid and apical left ventricle. 2. Normal left ventricular wall motion. 3. Left ventricular ejection fraction 53% 4. Non invasive risk stratification*: Intermediate *2012 Appropriate Use Criteria for Coronary Revascularization Focused Update: J Am Coll Cardiol. 206629;47(6):[email or web address]://content.[email or web address]?[email or web address] Electronically Signed   By: HeJacqulynn Cadet.D.   On: 03/22/2016 14:58   Dg Chest Port 1 View  Result Date: 03/14/2016 CLINICAL DATA:  Sepsis. EXAM: PORTABLE CHEST 1 VIEW COMPARISON:  Radiographs of January 11, 2016. FINDINGS: The heart size and mediastinal contours are within normal limits. Status post coronary bypass graft. Atherosclerosis of  thoracic aorta is noted. No pneumothorax or pleural effusion is noted. Elevated right hemidiaphragm is noted. Both lungs are clear. The visualized skeletal structures are unremarkable. IMPRESSION: Aortic atherosclerosis.  No acute cardiopulmonary abnormality seen. Electronically Signed   By: JaMarijo ConceptionM.D.   On: 03/14/2016 14:00     LOS: 1 day   GHOren BinetMD  [facility] Pager:[phone] 7PM-7AM, please contact night-coverage [email or web address] Password [email or web address] 03/23/2016, 11:06 AM

## 2016-03-24 DIAGNOSIS — R9439 Abnormal result of other cardiovascular function study: Secondary | ICD-10-CM

## 2016-03-24 DIAGNOSIS — D696 Thrombocytopenia, unspecified: Secondary | ICD-10-CM

## 2016-03-24 DIAGNOSIS — R079 Chest pain, unspecified: Secondary | ICD-10-CM

## 2016-03-24 LAB — CBC
HEMATOCRIT: 31.2 % — AB (ref 39.0–52.0)
HEMOGLOBIN: 10 g/dL — AB (ref 13.0–17.0)
MCH: 26.5 pg (ref 26.0–34.0)
MCHC: 32.1 g/dL (ref 30.0–36.0)
MCV: 82.5 fL (ref 78.0–100.0)
Platelets: 54 10*3/uL — ABNORMAL LOW (ref 150–400)
RBC: 3.78 MIL/uL — AB (ref 4.22–5.81)
RDW: 18.2 % — ABNORMAL HIGH (ref 11.5–15.5)
WBC: 3.9 10*3/uL — AB (ref 4.0–10.5)

## 2016-03-24 LAB — GLUCOSE, CAPILLARY
GLUCOSE-CAPILLARY: 209 mg/dL — AB (ref 65–99)
GLUCOSE-CAPILLARY: 255 mg/dL — AB (ref 65–99)
GLUCOSE-CAPILLARY: 328 mg/dL — AB (ref 65–99)
GLUCOSE-CAPILLARY: 290 mg/dL — AB (ref 65–99)
Glucose-Capillary: 334 mg/dL — ABNORMAL HIGH (ref 65–99)

## 2016-03-24 LAB — BASIC METABOLIC PANEL
ANION GAP: 8 (ref 5–15)
BUN: 32 mg/dL — ABNORMAL HIGH (ref 6–20)
CHLORIDE: 104 mmol/L (ref 101–111)
CO2: 23 mmol/L (ref 22–32)
Calcium: 8.7 mg/dL — ABNORMAL LOW (ref 8.9–10.3)
Creatinine, Ser: 1.25 mg/dL — ABNORMAL HIGH (ref 0.61–1.24)
GFR calc non Af Amer: 56 mL/min — ABNORMAL LOW (ref >60)
Glucose, Bld: 231 mg/dL — ABNORMAL HIGH (ref 65–99)
POTASSIUM: 4.3 mmol/L (ref 3.5–5.1)
SODIUM: 135 mmol/L (ref 135–145)

## 2016-03-24 MED ORDER — SODIUM CHLORIDE 0.9% FLUSH
3.0000 mL | Freq: Two times a day (BID) | INTRAVENOUS | Status: DC
  Administered 2016-03-24: 3 mL via INTRAVENOUS

## 2016-03-24 MED ORDER — INSULIN GLARGINE 100 UNIT/ML ~~LOC~~ SOLN
15.0000 [IU] | Freq: Every day | SUBCUTANEOUS | Status: DC
  Administered 2016-03-26: 15 [IU] via SUBCUTANEOUS
  Filled 2016-03-24 (×2): qty 0.15

## 2016-03-24 MED ORDER — SODIUM CHLORIDE 0.9 % WEIGHT BASED INFUSION
1.0000 mL/kg/h | INTRAVENOUS | Status: DC
  Administered 2016-03-24 – 2016-03-25 (×2): 1 mL/kg/h via INTRAVENOUS

## 2016-03-24 MED ORDER — SODIUM CHLORIDE 0.9 % IV SOLN: 250.0000 mL | INTRAVENOUS | Status: DC | PRN

## 2016-03-24 MED ORDER — SODIUM CHLORIDE 0.9% FLUSH: 3.0000 mL | INTRAVENOUS | Status: DC | PRN

## 2016-03-24 NOTE — Progress Notes (Signed)
DAILY PROGRESS NOTE  Subjective:  No events overnight - denies chest pain. Plan for [facility] on Monday.  Objective:  Temp:  [97.7 F (36.5 C)-98.6 F (37 C)] 97.7 F (36.5 C) (03/11 0502) Pulse Rate:  [60-64] 60 (03/11 0502) Resp:  [18-20] 18 (03/11 0502) BP: (136-158)/(52-59) 136/52 (03/11 0502) SpO2:  [97 %-99 %] 99 % (03/11 0502) Weight:  [147 lb 1.6 oz (66.7 kg)] 147 lb 1.6 oz (66.7 kg) (03/11 0502) Weight change: -1 lb 6.4 oz (-0.635 kg)  Intake/Output from previous day: 03/10 0701 - 03/11 0700 In: 1080 [P.O.:1080] Out: 500 [Urine:500]  Intake/Output from this shift: No intake/output data recorded.  Medications: No current facility-administered medications on file prior to encounter.    No current outpatient prescriptions on file prior to encounter.    Physical Exam: General appearance: alert and no distress Lungs: clear to auscultation bilaterally Heart: regular rate and rhythm Extremities: extremities normal, atraumatic, no cyanosis or edema Neurologic: Grossly normal  Lab Results: Results for orders placed or performed during the hospital encounter of 03/27/2016 (from the past 48 hour(s))  Glucose, capillary     Status: Abnormal   Collection Time: 03/22/16  4:53 PM  Result Value Ref Range   Glucose-Capillary 313 (H) 65 - 99 mg/dL   Comment 1 Notify RN   Glucose, capillary     Status: Abnormal   Collection Time: 03/22/16  9:00 PM  Result Value Ref Range   Glucose-Capillary 233 (H) 65 - 99 mg/dL  Glucose, capillary     Status: Abnormal   Collection Time: 03/23/16  6:30 AM  Result Value Ref Range   Glucose-Capillary 213 (H) 65 - 99 mg/dL  Glucose, capillary     Status: Abnormal   Collection Time: 03/23/16 11:39 AM  Result Value Ref Range   Glucose-Capillary 345 (H) 65 - 99 mg/dL   Comment 1 Notify RN    Comment 2 Document in Chart   Glucose, capillary     Status: Abnormal   Collection Time: 03/23/16  4:33 PM  Result Value Ref Range   Glucose-Capillary 219 (H) 65 - 99 mg/dL   Comment 1 Notify RN    Comment 2 Document in Chart   Glucose, capillary     Status: Abnormal   Collection Time: 03/23/16  8:43 PM  Result Value Ref Range   Glucose-Capillary 276 (H) 65 - 99 mg/dL  CBC     Status: Abnormal   Collection Time: 03/24/16  4:39 AM  Result Value Ref Range   WBC 3.9 (L) 4.0 - 10.5 K/uL   RBC 3.78 (L) 4.22 - 5.81 MIL/uL   Hemoglobin 10.0 (L) 13.0 - 17.0 g/dL   HCT 31.2 (L) 39.0 - 52.0 %   MCV 82.5 78.0 - 100.0 fL   MCH 26.5 26.0 - 34.0 pg   MCHC 32.1 30.0 - 36.0 g/dL   RDW 18.2 (H) 11.5 - 15.5 %   Platelets 54 (L) 150 - 400 K/uL    Comment: CONSISTENT WITH PREVIOUS RESULT  Basic metabolic panel     Status: Abnormal   Collection Time: 03/24/16  4:39 AM  Result Value Ref Range   Sodium 135 135 - 145 mmol/L   Potassium 4.3 3.5 - 5.1 mmol/L   Chloride 104 101 - 111 mmol/L   CO2 23 22 - 32 mmol/L   Glucose, Bld 231 (H) 65 - 99 mg/dL   BUN 32 (H) 6 - 20 mg/dL   Creatinine, Ser 1.25 (H) 0.61 -  1.24 mg/dL   Calcium 8.7 (L) 8.9 - 10.3 mg/dL   GFR calc non Af Amer 56 (L) >60 mL/min   GFR calc Af Amer >60 >60 mL/min    Comment: (NOTE) The eGFR has been calculated using the CKD EPI equation. This calculation has not been validated in all clinical situations. eGFR's persistently <60 mL/min signify possible Chronic Kidney Disease.    Anion gap 8 5 - 15  Glucose, capillary     Status: Abnormal   Collection Time: 03/24/16  5:00 AM  Result Value Ref Range   Glucose-Capillary 209 (H) 65 - 99 mg/dL  Glucose, capillary     Status: Abnormal   Collection Time: 03/24/16  8:28 AM  Result Value Ref Range   Glucose-Capillary 255 (H) 65 - 99 mg/dL  Glucose, capillary     Status: Abnormal   Collection Time: 03/24/16 12:07 PM  Result Value Ref Range   Glucose-Capillary 334 (H) 65 - 99 mg/dL   Comment 1 Notify RN     Imaging: No results found.  Assessment:  Principal Problem:   Syncope Active Problems:   Type II  diabetes mellitus with peripheral circulatory disorder (HCC)   Hypertension   COPD (chronic obstructive pulmonary disease) (HCC)   Essential hypertension, malignant   CAD (coronary artery disease)   Anemia   Plan:  Plan for [facility] on Monday for abnormal myoview, possibly playing a role in syncope. Creatinine improved to 1.25. Will hydrate overnight - he is an add-on case, will likely be in the afternoon. [place] for clear liquid breakfast then NPO.  Time Spent Directly with Patient:  15 minutes  Length of Stay:  LOS: 2 days   Pixie Casino, MD, [facility] Attending Cardiologist [place] 03/24/2016, 12:18 PM

## 2016-03-24 NOTE — Progress Notes (Signed)
PROGRESS NOTE        PATIENT DETAILS Name: Douglas Mccoy Age: 71 y.o. Sex: male Date of Birth: 1945/08/20 Admit Date: 04/13/2016 Admitting Physician Waldemar Dickens, MD PCP:[facility]  Brief Narrative: Patient is a 71 y.o. 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  Subjective: No chest pain or shortness of breath. Sitting up in bed earlier this morning and eating breakfast.  Assessment/Plan: Recurrent syncope/exertional chest pain: Known history of CAD, recent echo showed normal LV systolic function. Evaluated by cardiology, underwent nuclear stress test which was positive for ischemia . Cardiology planning on cardiac cath on 3/12.  History of hypertension: Blood pressure currently controlled, currently not on any antihypertensive regimen. Follow closely in the outpatient setting  Insulin-dependent type 2 diabetes: A1c is 9.1, CBGs still not optimal-increase Lantus to 15 units, continue SSI and follow.   History of CAD status post CABG: Continue aspirin, will need to follow platelet count closely  History of COPD/asthma: Lungs are clear-no evidence of exacerbation, continue usual bronchodilator regimen  Anemia/mild thrombocytopenia: Probably due to underlying multiple myeloma. Cardiology has consulted hematology over the phone on 3/9. Continue Revlimid.  History of seizures: Continue Keppra. Patient no longer drives-and he is aware that he is not to drive in the future, without clearance from his primary neurologist.  DVT Prophylaxis:  SCD's  Code Status: Full code  Family Communication: None at bedside  Disposition Plan: Remain inpatient  Antimicrobial agents: Anti-infectives    Start     Dose/Rate Route Frequency Ordered Stop   03/22/16 0000   acyclovir (ZOVIRAX) 200 MG capsule     200 mg Oral 2 times daily 03/22/16 1324     04/05/2016 2200  acyclovir (ZOVIRAX) 200 MG capsule 200 mg     200 mg Oral 2 times daily 03/30/2016 1531        Procedures: None  CONSULTS:  cardiology  Time spent: 25 minutes-Greater than 50% of this time was spent in counseling, explanation of diagnosis, planning of further management, and coordination of care.  MEDICATIONS: Scheduled Meds: . acyclovir  200 mg Oral BID  . aspirin EC  81 mg Oral Daily  . atorvastatin  80 mg Oral q1800  . carvedilol  12.5 mg Oral BID WC  . gabapentin  600 mg Oral BID  . insulin aspart  0-5 Units Subcutaneous QHS  . insulin aspart  0-9 Units Subcutaneous TID WC  . insulin glargine  10 Units Subcutaneous Daily  . levETIRAcetam  500 mg Oral Daily  . levothyroxine  50 mcg Oral QAC breakfast  . magnesium oxide  400 mg Oral BID  . pneumococcal 23 valent vaccine  0.5 mL Intramuscular Tomorrow-1000  . sertraline  50 mg Oral Daily  . sodium chloride flush  3 mL Intravenous Q12H   Continuous Infusions: PRN Meds:.acetaminophen **OR** acetaminophen, albuterol, calcium carbonate, fluticasone, loperamide, ondansetron **OR** ondansetron (ZOFRAN) IV   PHYSICAL EXAM: Vital signs: Vitals:   03/23/16 0500 03/23/16 1237 03/23/16 2045 03/24/16 0502  BP: (!) 162/67 (!) 143/54 (!) 158/59 (!) 136/52  Pulse: 65 61 64 60  Resp: 17 18 20 18   Temp: 97.9 F (36.6 C) 97.9 F (36.6 C) 98.6 F (37 C)  97.7 F (36.5 C)  TempSrc: Oral Oral Oral Oral  SpO2: 98% 99% 97% 99%  Weight: 67.4 kg (148 lb 8 oz)   66.7 kg (147 lb 1.6 oz)  Height:       Filed Weights   03/22/16 0359 03/23/16 0500 03/24/16 0502  Weight: 68 kg (150 lb) 67.4 kg (148 lb 8 oz) 66.7 kg (147 lb 1.6 oz)   Body mass index is 24.48 kg/m.   General appearance :Awake, alert, not in any distress. Speech Clear. Eyes:, pupils equally reactive to light and accomodation,no scleral icterus. HEENT: Atraumatic and  Normocephalic Neck: supple, no JVD. No cervical lymphadenopathy. Resp:Good air entry bilaterally, no added sounds  CVS: S1 S2 regular, no murmurs.  GI: Bowel sounds present, Non tender and not distended with no gaurding, rigidity or rebound. Extremities: B/L Lower Ext shows no edema, both legs are warm to touch Neurology:  speech clear,Non focal, sensation is grossly intact. Psychiatric: Normal judgment and insight. Alert and oriented x 3. Normal mood. Musculoskeletal:No digital cyanosis Skin:No Rash, warm and dry Wounds:N/A  I have personally reviewed following labs and imaging studies  LABORATORY DATA: CBC:  Recent Labs Lab 03/19/16 1550 03/20/16 0441 03/31/2016 1436 03/22/16 0314 03/24/16 0439  WBC 4.1 3.4* 4.6 3.1* 3.9*  NEUTROABS 2.5  --  3.6  --   --   HGB 9.2* 9.0* 9.6* 9.5* 10.0*  HCT 27.7* 27.4* 29.4* 28.0* 31.2*  MCV 82.2 82.3 81.9 81.2 82.5  PLT 72* 64* 65* 67* 54*    Basic Metabolic Panel:  Recent Labs Lab 03/19/16 1550 03/20/16 0441 04/12/2016 1436 03/22/16 0314 03/24/16 0439  NA 136 135 136 134* 135  K 4.8 4.4 4.6 4.8 4.3  CL 106 104 104 104 104  CO2 22 22 23  20* 23  GLUCOSE 155* 192* 305* 323* 231*  BUN 22* 19 17 24* 32*  CREATININE 1.28* 1.24 1.41* 1.34* 1.25*  CALCIUM 9.0 9.1 8.6* 8.7* 8.7*    GFR: Estimated Creatinine Clearance: 46.5 mL/min (by C-G formula based on SCr of 1.25 mg/dL (H)).  Liver Function Tests:  Recent Labs Lab 03/19/16 1550 03/20/16 0441  AST 19 17  ALT 16* 16*  ALKPHOS 57 54  BILITOT 1.2 0.8  PROT 6.3* 6.0*  ALBUMIN 3.5 3.2*   No results for input(s): LIPASE, AMYLASE in the last 168 hours. No results for input(s): AMMONIA in the last 168 hours.  Coagulation Profile: No results for input(s): INR, PROTIME in the last 168 hours.  Cardiac Enzymes: No results for input(s): CKTOTAL, CKMB, CKMBINDEX, TROPONINI in the last 168 hours.  BNP (last 3 results) No results for input(s): PROBNP in the last 8760  hours.  HbA1C: No results for input(s): HGBA1C in the last 72 hours.  CBG:  Recent Labs Lab 03/23/16 1139 03/23/16 1633 03/23/16 2043 03/24/16 0500 03/24/16 0828  GLUCAP 345* 219* 276* 209* 255*    Lipid Profile: No results for input(s): CHOL, HDL, LDLCALC, TRIG, CHOLHDL, LDLDIRECT in the last 72 hours.  Thyroid Function Tests: No results for input(s): TSH, T4TOTAL, FREET4, T3FREE, THYROIDAB in the last 72 hours.  Anemia Panel: No results for input(s): VITAMINB12, FOLATE, FERRITIN, TIBC, IRON, RETICCTPCT in the last 72 hours.  Urine analysis:    Component Value Date/Time   COLORURINE STRAW (A) 03/14/2016 1427   APPEARANCEUR CLEAR 03/14/2016 1427   LABSPEC 1.014 03/14/2016 1427   PHURINE 7.0 03/14/2016 1427   GLUCOSEU >=500 (A) 03/14/2016 1427   HGBUR NEGATIVE 03/14/2016 1427   BILIRUBINUR  NEGATIVE 03/14/2016 1427   KETONESUR NEGATIVE 03/14/2016 1427   PROTEINUR NEGATIVE 03/14/2016 1427   NITRITE NEGATIVE 03/14/2016 1427   LEUKOCYTESUR NEGATIVE 03/14/2016 1427    Sepsis Labs: Lactic Acid, Venous    Component Value Date/Time   LATICACIDVEN 1.85 01/10/2016 1129    MICROBIOLOGY: No results found for this or any previous visit (from the past 240 hour(s)).  RADIOLOGY STUDIES/RESULTS: X-ray Chest Pa And Lateral  Result Date: 03/19/2016 CLINICAL DATA:  Respiratory crackles, pneumonia 2 weeks ago. Hypotensive. History of COPD, CABG, hypertension. EXAM: CHEST  2 VIEW COMPARISON:  Chest radiograph March 14, 2016 FINDINGS: Cardiac silhouette is upper limits normal size, calcified tortuous aorta. Status post median sternotomy for CABG with fractured midsternal heroin. Chronically elevated RIGHT hemidiaphragm. No pleural effusion or focal consolidation. No pneumothorax. Soft tissue planes included osseous structures are nonsuspicious. Single level mid thoracic vertebral body cement augmentation. Mild old approximate T11 compression fracture. Surgical clips in the included right  abdomen compatible with cholecystectomy. IMPRESSION: Stable examination: Borderline cardiomegaly, no acute pulmonary process. Electronically Signed   By: Elon Alas M.D.   On: 03/19/2016 19:20   Nm Myocar Multi W/spect W/wall Motion / Ef  Result Date: 03/22/2016 CLINICAL DATA:  71 year old male with abnormal EKG EXAM: MYOCARDIAL IMAGING WITH SPECT (REST AND PHARMACOLOGIC-STRESS) GATED LEFT VENTRICULAR WALL MOTION STUDY LEFT VENTRICULAR EJECTION FRACTION TECHNIQUE: Standard myocardial SPECT imaging was performed after resting intravenous injection of 10 mCi Tc-2mtetrofosmin. Subsequently, intravenous infusion of Lexiscan was performed under the supervision of the Cardiology staff. At peak effect of the drug, 30 mCi Tc-943metrofosmin was injected intravenously and standard myocardial SPECT imaging was performed. Quantitative gated imaging was also performed to evaluate left ventricular wall motion, and estimate left ventricular ejection fraction. COMPARISON:  Prior chest x-ray 03/19/2016 FINDINGS: Perfusion: Moderate region of the decreased radiotracer activity involving the mid and apical inferior and inferolateral wall consistent with reversible ischemia. Wall Motion: Normal left ventricular wall motion. No left ventricular dilation. Left Ventricular Ejection Fraction: 53 % End diastolic volume 96 ml End systolic volume 45 ml IMPRESSION: 1. Positive for a moderate region of reversible ischemia in the inferior and inferolateral walls of the mid and apical left ventricle. 2. Normal left ventricular wall motion. 3. Left ventricular ejection fraction 53% 4. Non invasive risk stratification*: Intermediate *2012 Appropriate Use Criteria for Coronary Revascularization Focused Update: J Am Coll Cardiol. 207824;23(5):[email or web address] Electronically Signed   By: HeJacqulynn Cadet.D.   On: 03/22/2016 14:58   Dg Chest Port 1 View  Result Date:  03/14/2016 CLINICAL DATA:  Sepsis. EXAM: PORTABLE CHEST 1 VIEW COMPARISON:  Radiographs of January 11, 2016. FINDINGS: The heart size and mediastinal contours are within normal limits. Status post coronary bypass graft. Atherosclerosis of thoracic aorta is noted. No pneumothorax or pleural effusion is noted. Elevated right hemidiaphragm is noted. Both lungs are clear. The visualized skeletal structures are unremarkable. IMPRESSION: Aortic atherosclerosis.  No acute cardiopulmonary abnormality seen. Electronically Signed   By: JaMarijo ConceptionM.D.   On: 03/14/2016 14:00     LOS: 2 days   GHOren BinetMD  [facility] Pager:[phone] 7PM-7AM, please contact night-coverage [email or web address] Password [facility]/11/2016, 10:18 AM

## 2016-03-25 ENCOUNTER — Encounter (HOSPITAL_COMMUNITY): Admission: EM | Disposition: E | Payer: Self-pay | Source: Home / Self Care | Attending: Internal Medicine

## 2016-03-25 DIAGNOSIS — I25118 Atherosclerotic heart disease of native coronary artery with other forms of angina pectoris: Secondary | ICD-10-CM

## 2016-03-25 DIAGNOSIS — R9439 Abnormal result of other cardiovascular function study: Secondary | ICD-10-CM

## 2016-03-25 DIAGNOSIS — R55 Syncope and collapse: Secondary | ICD-10-CM

## 2016-03-25 DIAGNOSIS — I259 Chronic ischemic heart disease, unspecified: Secondary | ICD-10-CM

## 2016-03-25 DIAGNOSIS — E1151 Type 2 diabetes mellitus with diabetic peripheral angiopathy without gangrene: Secondary | ICD-10-CM

## 2016-03-25 HISTORY — PX: AORTOGRAM: SHX6300

## 2016-03-25 HISTORY — PX: CORONARY/GRAFT ANGIOGRAPHY: CATH118237

## 2016-03-25 LAB — BASIC METABOLIC PANEL
Anion gap: 7 (ref 5–15)
BUN: 29 mg/dL — AB (ref 6–20)
CHLORIDE: 102 mmol/L (ref 101–111)
CO2: 26 mmol/L (ref 22–32)
CREATININE: 1.23 mg/dL (ref 0.61–1.24)
Calcium: 8.7 mg/dL — ABNORMAL LOW (ref 8.9–10.3)
GFR calc Af Amer: >60 mL/min (ref >60)
GFR calc non Af Amer: 57 mL/min — ABNORMAL LOW (ref >60)
Glucose, Bld: 187 mg/dL — ABNORMAL HIGH (ref 65–99)
Potassium: 4.3 mmol/L (ref 3.5–5.1)
Sodium: 135 mmol/L (ref 135–145)

## 2016-03-25 LAB — GLUCOSE, CAPILLARY
GLUCOSE-CAPILLARY: 182 mg/dL — AB (ref 65–99)
GLUCOSE-CAPILLARY: 196 mg/dL — AB (ref 65–99)
GLUCOSE-CAPILLARY: 334 mg/dL — AB (ref 65–99)
Glucose-Capillary: 179 mg/dL — ABNORMAL HIGH (ref 65–99)

## 2016-03-25 LAB — LIPID PANEL
CHOL/HDL RATIO: 2.2 ratio
Cholesterol: 68 mg/dL (ref 0–200)
HDL: 31 mg/dL — AB (ref >40)
LDL CALC: 23 mg/dL (ref 0–99)
Triglycerides: 69 mg/dL (ref <150)
VLDL: 14 mg/dL (ref 0–40)

## 2016-03-25 LAB — CBC
HEMATOCRIT: 29.9 % — AB (ref 39.0–52.0)
HEMOGLOBIN: 9.5 g/dL — AB (ref 13.0–17.0)
MCH: 26.2 pg (ref 26.0–34.0)
MCHC: 31.8 g/dL (ref 30.0–36.0)
MCV: 82.4 fL (ref 78.0–100.0)
Platelets: 56 10*3/uL — ABNORMAL LOW (ref 150–400)
RBC: 3.63 MIL/uL — ABNORMAL LOW (ref 4.22–5.81)
RDW: 17.9 % — ABNORMAL HIGH (ref 11.5–15.5)
WBC: 4.9 10*3/uL (ref 4.0–10.5)

## 2016-03-25 SURGERY — CORONARY/GRAFT ANGIOGRAPHY

## 2016-03-25 MED ORDER — VERAPAMIL HCL 2.5 MG/ML IV SOLN
INTRAVENOUS | Status: AC
  Filled 2016-03-25: qty 2

## 2016-03-25 MED ORDER — MIDAZOLAM HCL 2 MG/2ML IJ SOLN
INTRAMUSCULAR | Status: AC
  Filled 2016-03-25: qty 2

## 2016-03-25 MED ORDER — FENTANYL CITRATE (PF) 100 MCG/2ML IJ SOLN
INTRAMUSCULAR | Status: DC | PRN
  Administered 2016-03-25 (×2): 25 ug via INTRAVENOUS

## 2016-03-25 MED ORDER — HEPARIN (PORCINE) IN NACL 2-0.9 UNIT/ML-% IJ SOLN
INTRAMUSCULAR | Status: DC | PRN
  Administered 2016-03-25: 10 mL via INTRA_ARTERIAL

## 2016-03-25 MED ORDER — SODIUM CHLORIDE 0.9% FLUSH: 3.0000 mL | INTRAVENOUS | Status: DC | PRN

## 2016-03-25 MED ORDER — ONDANSETRON HCL 4 MG/2ML IJ SOLN: 4.0000 mg | Freq: Four times a day (QID) | INTRAMUSCULAR | Status: DC | PRN

## 2016-03-25 MED ORDER — HEPARIN SODIUM (PORCINE) 1000 UNIT/ML IJ SOLN
INTRAMUSCULAR | Status: DC | PRN
  Administered 2016-03-25: 3200 [IU] via INTRAVENOUS

## 2016-03-25 MED ORDER — SODIUM CHLORIDE 0.9 % IV SOLN
INTRAVENOUS | Status: DC
  Administered 2016-03-25: 21:00:00 via INTRAVENOUS

## 2016-03-25 MED ORDER — IOPAMIDOL (ISOVUE-370) INJECTION 76%
INTRAVENOUS | Status: AC
  Filled 2016-03-25: qty 100

## 2016-03-25 MED ORDER — HEPARIN (PORCINE) IN NACL 2-0.9 UNIT/ML-% IJ SOLN
INTRAMUSCULAR | Status: AC
  Filled 2016-03-25: qty 500

## 2016-03-25 MED ORDER — SODIUM CHLORIDE 0.9 % IV SOLN: 250.0000 mL | INTRAVENOUS | Status: DC | PRN

## 2016-03-25 MED ORDER — HEPARIN SODIUM (PORCINE) 1000 UNIT/ML IJ SOLN
INTRAMUSCULAR | Status: AC
  Filled 2016-03-25: qty 1

## 2016-03-25 MED ORDER — ACETAMINOPHEN 325 MG PO TABS: 650.0000 mg | ORAL_TABLET | ORAL | Status: DC | PRN

## 2016-03-25 MED ORDER — FENTANYL CITRATE (PF) 100 MCG/2ML IJ SOLN
INTRAMUSCULAR | Status: AC
  Filled 2016-03-25: qty 2

## 2016-03-25 MED ORDER — VERAPAMIL HCL 2.5 MG/ML IV SOLN
INTRAVENOUS | Status: DC | PRN
  Administered 2016-03-25: 10 mL via INTRA_ARTERIAL

## 2016-03-25 MED ORDER — IOPAMIDOL (ISOVUE-370) INJECTION 76%
INTRAVENOUS | Status: DC | PRN
Start: 2016-03-25 — End: 2016-03-25
  Administered 2016-03-25: 130 mL via INTRA_ARTERIAL

## 2016-03-25 MED ORDER — HEPARIN (PORCINE) IN NACL 2-0.9 UNIT/ML-% IJ SOLN
INTRAMUSCULAR | Status: DC | PRN
  Administered 2016-03-25: 1000 mL

## 2016-03-25 MED ORDER — SODIUM CHLORIDE 0.9% FLUSH
3.0000 mL | Freq: Two times a day (BID) | INTRAVENOUS | Status: DC
  Administered 2016-03-27: 3 mL via INTRAVENOUS

## 2016-03-25 MED ORDER — HEPARIN SODIUM (PORCINE) 1000 UNIT/ML IJ SOLN
INTRAMUSCULAR | Status: AC
Start: 2016-03-25 — End: 2016-03-25
  Filled 2016-03-25: qty 1

## 2016-03-25 MED ORDER — LIDOCAINE HCL (PF) 1 % IJ SOLN
INTRAMUSCULAR | Status: DC | PRN
  Administered 2016-03-25: 2 mL

## 2016-03-25 MED ORDER — LIDOCAINE HCL (PF) 1 % IJ SOLN
INTRAMUSCULAR | Status: AC
  Filled 2016-03-25: qty 30

## 2016-03-25 MED ORDER — DIAZEPAM 5 MG PO TABS: 5.0000 mg | ORAL_TABLET | Freq: Four times a day (QID) | ORAL | Status: DC | PRN

## 2016-03-25 MED ORDER — MIDAZOLAM HCL 2 MG/2ML IJ SOLN
INTRAMUSCULAR | Status: DC | PRN
  Administered 2016-03-25: 1 mg via INTRAVENOUS
  Administered 2016-03-25: 2 mg via INTRAVENOUS

## 2016-03-25 SURGICAL SUPPLY — 14 items
CATH EXPO 5FR AL1 (CATHETERS) ×4 IMPLANT
CATH EXPO 5FR AL2 (CATHETERS) ×4 IMPLANT
CATH EXPO 5FR FR4 (CATHETERS) ×4 IMPLANT
CATH INFINITI 5FR MULTPACK ANG (CATHETERS) ×4 IMPLANT
DEVICE RAD COMP TR BAND LRG (VASCULAR PRODUCTS) ×4 IMPLANT
GLIDESHEATH SLEND SS 6F .021 (SHEATH) ×4 IMPLANT
GUIDEWIRE INQWIRE 1.5J.035X260 (WIRE) ×2 IMPLANT
INQWIRE 1.5J .035X260CM (WIRE) ×4
KIT HEART LEFT (KITS) ×4 IMPLANT
PACK CARDIAC CATHETERIZATION (CUSTOM PROCEDURE TRAY) ×4 IMPLANT
SYR MEDRAD MARK V 150ML (SYRINGE) ×4 IMPLANT
TRANSDUCER W/STOPCOCK (MISCELLANEOUS) ×4 IMPLANT
TUBING CIL FLEX 10 FLL-RA (TUBING) ×4 IMPLANT
WIRE EMERALD ST .035X260CM (WIRE) ×4 IMPLANT

## 2016-03-25 NOTE — Progress Notes (Signed)
PROGRESS NOTE        PATIENT DETAILS Name: Douglas Mccoy Age: 71 y.o. Sex: male Date of Birth: Mar 09, 1945 Admit Date: 04/06/2016 Admitting Physician Waldemar Dickens, MD PCP:[facility]  Brief Narrative: Patient is a 71 y.o. 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  Subjective: No chest pain or shortness of breath.   Assessment/Plan: Recurrent syncope/exertional chest pain: Known history of CAD, recent echo showed normal LV systolic function. Evaluated by cardiology, underwent nuclear stress test which was positive for ischemia . Cardiology planning on cardiac cath on 3/12.Syncope likely from autonomic dysfunction.  History of hypertension: Blood pressure currently controlled, currently not on any antihypertensive regimen. Follow closely in the outpatient setting  Insulin-dependent type 2 diabetes: A1c is 9.1, CBGs still not optimal-Lantus increased to 15 units yesterday-currently NPO-continue current dosing and SSI and follow CBG trend.   History of CAD status post CABG: Continue aspirin, will need to follow platelet count closely  History of COPD/asthma: Lungs are clear-no evidence of exacerbation, continue usual bronchodilator regimen  Anemia/mild thrombocytopenia: Probably due to underlying multiple myeloma. Cardiology has consulted hematology over the phone on 3/9. Continue Revlimid.  History of seizures: Continue Keppra. Patient no longer drives-and he is aware that he is not to drive in the future, without clearance from his primary neurologist.  DVT Prophylaxis:  SCD's  Code Status: Full code  Family Communication: None at bedside  Disposition Plan: Remain inpatient  Antimicrobial agents: Anti-infectives    Start     Dose/Rate Route  Frequency Ordered Stop   03/22/16 0000  acyclovir (ZOVIRAX) 200 MG capsule     200 mg Oral 2 times daily 03/22/16 1324     03/15/2016 2200  acyclovir (ZOVIRAX) 200 MG capsule 200 mg     200 mg Oral 2 times daily 03/14/2016 1531        Procedures: None  CONSULTS:  cardiology  Time spent: 25 minutes-Greater than 50% of this time was spent in counseling, explanation of diagnosis, planning of further management, and coordination of care.  MEDICATIONS: Scheduled Meds: . acyclovir  200 mg Oral BID  . aspirin EC  81 mg Oral Daily  . atorvastatin  80 mg Oral q1800  . carvedilol  12.5 mg Oral BID WC  . gabapentin  600 mg Oral BID  . insulin aspart  0-5 Units Subcutaneous QHS  . insulin aspart  0-9 Units Subcutaneous TID WC  . insulin glargine  15 Units Subcutaneous Daily  . levETIRAcetam  500 mg Oral Daily  . levothyroxine  50 mcg Oral QAC breakfast  . magnesium oxide  400 mg Oral BID  . pneumococcal 23 valent vaccine  0.5 mL Intramuscular Tomorrow-1000  . sertraline  50 mg Oral Daily  . sodium chloride flush  3 mL Intravenous Q12H  . sodium chloride flush  3 mL Intravenous Q12H   Continuous Infusions: . sodium chloride 1 mL/kg/hr (03/24/16 2129)   PRN Meds:.sodium chloride, acetaminophen **OR** acetaminophen, albuterol, calcium carbonate, fluticasone, loperamide, ondansetron **OR** ondansetron (ZOFRAN) IV, sodium chloride flush   PHYSICAL EXAM: Vital signs: Vitals:   03/24/16 0502 03/24/16 1337 03/24/16 2043 04/08/2016 0600  BP: (!) 136/52 (!) 142/58 (!) 148/58 (!) 115/54  Pulse: 60 68 66 (!) 59  Resp: 18 18 18 16   Temp: 97.7 F (36.5 C) 98.2 F (36.8 C) 98.4 F (36.9 C) 97.5 F (36.4 C)  TempSrc: Oral Oral Oral Oral  SpO2: 99% 99% 98% 100%  Weight: 66.7 kg (147 lb 1.6 oz)   67 kg (147 lb 12.8 oz)  Height:       Filed Weights   03/23/16 0500 03/24/16 0502 03/31/2016 0600  Weight: 67.4 kg (148 lb 8 oz) 66.7 kg (147 lb 1.6 oz) 67 kg (147 lb 12.8 oz)   Body mass index is  24.6 kg/m.   General appearance :Awake, alert, not in any distress. Speech Clear. Eyes:, pupils equally reactive to light and accomodation,no scleral icterus. HEENT: Atraumatic and Normocephalic Neck: supple, no JVD. No cervical lymphadenopathy. Resp:Good air entry bilaterally, no added sounds  CVS: S1 S2 regular, no murmurs.  GI: Bowel sounds present, Non tender and not distended with no gaurding, rigidity or rebound. Extremities: B/L Lower Ext shows no edema, both legs are warm to touch Neurology:  speech clear,Non focal, sensation is grossly intact. Psychiatric: Normal judgment and insight. Alert and oriented x 3. Normal mood. Musculoskeletal:No digital cyanosis Skin:No Rash, warm and dry Wounds:N/A  I have personally reviewed following labs and imaging studies  LABORATORY DATA: CBC:  Recent Labs Lab 03/19/16 1550 03/20/16 0441 04/04/2016 1436 03/22/16 0314 03/24/16 0439 03/20/2016 0201  WBC 4.1 3.4* 4.6 3.1* 3.9* 4.9  NEUTROABS 2.5  --  3.6  --   --   --   HGB 9.2* 9.0* 9.6* 9.5* 10.0* 9.5*  HCT 27.7* 27.4* 29.4* 28.0* 31.2* 29.9*  MCV 82.2 82.3 81.9 81.2 82.5 82.4  PLT 72* 64* 65* 67* 54* 56*    Basic Metabolic Panel:  Recent Labs Lab 03/20/16 0441 04/09/2016 1436 03/22/16 0314 03/24/16 0439 03/15/2016 0201  NA 135 136 134* 135 135  K 4.4 4.6 4.8 4.3 4.3  CL 104 104 104 104 102  CO2 22 23 20* 23 26  GLUCOSE 192* 305* 323* 231* 187*  BUN 19 17 24* 32* 29*  CREATININE 1.24 1.41* 1.34* 1.25* 1.23  CALCIUM 9.1 8.6* 8.7* 8.7* 8.7*    GFR: Estimated Creatinine Clearance: 47.2 mL/min (by C-G formula based on SCr of 1.23 mg/dL).  Liver Function Tests:  Recent Labs Lab 03/19/16 1550 03/20/16 0441  AST 19 17  ALT 16* 16*  ALKPHOS 57 54  BILITOT 1.2 0.8  PROT 6.3* 6.0*  ALBUMIN 3.5 3.2*   No results for input(s): LIPASE, AMYLASE in the last 168 hours. No results for input(s): AMMONIA in the last 168 hours.  Coagulation Profile: No results for input(s):  INR, PROTIME in the last 168 hours.  Cardiac Enzymes: No results for input(s): CKTOTAL, CKMB, CKMBINDEX, TROPONINI in the last 168 hours.  BNP (last 3 results) No results for input(s): PROBNP in the last 8760 hours.  HbA1C: No results for input(s): HGBA1C in the last 72 hours.  CBG:  Recent Labs Lab 03/24/16 0828 03/24/16 1207 03/24/16 1655 03/24/16 2308 03/31/2016 0630  GLUCAP 255* 334* 328* 290* 179*    Lipid Profile: No results for input(s): CHOL, HDL, LDLCALC, TRIG, CHOLHDL, LDLDIRECT in the last 72 hours.  Thyroid Function Tests: No results for input(s): TSH, T4TOTAL, FREET4, T3FREE, THYROIDAB in the last 72 hours.  Anemia Panel: No results for input(s): VITAMINB12, FOLATE, FERRITIN, TIBC, IRON, RETICCTPCT in the last 72 hours.  Urine analysis:    Component Value Date/Time   COLORURINE STRAW (  A) 03/14/2016 1427   APPEARANCEUR CLEAR 03/14/2016 1427   LABSPEC 1.014 03/14/2016 1427   PHURINE 7.0 03/14/2016 1427   GLUCOSEU >=500 (A) 03/14/2016 1427   HGBUR NEGATIVE 03/14/2016 1427   BILIRUBINUR NEGATIVE 03/14/2016 1427   KETONESUR NEGATIVE 03/14/2016 1427   PROTEINUR NEGATIVE 03/14/2016 1427   NITRITE NEGATIVE 03/14/2016 1427   LEUKOCYTESUR NEGATIVE 03/14/2016 1427    Sepsis Labs: Lactic Acid, Venous    Component Value Date/Time   LATICACIDVEN 1.85 01/10/2016 1129    MICROBIOLOGY: No results found for this or any previous visit (from the past 240 hour(s)).  RADIOLOGY STUDIES/RESULTS: X-ray Chest Pa And Lateral  Result Date: 03/19/2016 CLINICAL DATA:  Respiratory crackles, pneumonia 2 weeks ago. Hypotensive. History of COPD, CABG, hypertension. EXAM: CHEST  2 VIEW COMPARISON:  Chest radiograph March 14, 2016 FINDINGS: Cardiac silhouette is upper limits normal size, calcified tortuous aorta. Status post median sternotomy for CABG with fractured midsternal heroin. Chronically elevated RIGHT hemidiaphragm. No pleural effusion or focal consolidation. No  pneumothorax. Soft tissue planes included osseous structures are nonsuspicious. Single level mid thoracic vertebral body cement augmentation. Mild old approximate T11 compression fracture. Surgical clips in the included right abdomen compatible with cholecystectomy. IMPRESSION: Stable examination: Borderline cardiomegaly, no acute pulmonary process. Electronically Signed   By: Elon Alas M.D.   On: 03/19/2016 19:20   Nm Myocar Multi W/spect W/wall Motion / Ef  Result Date: 03/22/2016 CLINICAL DATA:  71 year old male with abnormal EKG EXAM: MYOCARDIAL IMAGING WITH SPECT (REST AND PHARMACOLOGIC-STRESS) GATED LEFT VENTRICULAR WALL MOTION STUDY LEFT VENTRICULAR EJECTION FRACTION TECHNIQUE: Standard myocardial SPECT imaging was performed after resting intravenous injection of 10 mCi Tc-28mtetrofosmin. Subsequently, intravenous infusion of Lexiscan was performed under the supervision of the Cardiology staff. At peak effect of the drug, 30 mCi Tc-972metrofosmin was injected intravenously and standard myocardial SPECT imaging was performed. Quantitative gated imaging was also performed to evaluate left ventricular wall motion, and estimate left ventricular ejection fraction. COMPARISON:  Prior chest x-ray 03/19/2016 FINDINGS: Perfusion: Moderate region of the decreased radiotracer activity involving the mid and apical inferior and inferolateral wall consistent with reversible ischemia. Wall Motion: Normal left ventricular wall motion. No left ventricular dilation. Left Ventricular Ejection Fraction: 53 % End diastolic volume 96 ml End systolic volume 45 ml IMPRESSION: 1. Positive for a moderate region of reversible ischemia in the inferior and inferolateral walls of the mid and apical left ventricle. 2. Normal left ventricular wall motion. 3. Left ventricular ejection fraction 53% 4. Non invasive risk stratification*: Intermediate *2012 Appropriate Use Criteria for Coronary Revascularization Focused Update: J Am  Coll Cardiol. 207846;96(2):952-841http://[email or web address] Electronically Signed   By: HeJacqulynn Cadet.D.   On: 03/22/2016 14:58   Dg Chest Port 1 View  Result Date: 03/14/2016 CLINICAL DATA:  Sepsis. EXAM: PORTABLE CHEST 1 VIEW COMPARISON:  Radiographs of January 11, 2016. FINDINGS: The heart size and mediastinal contours are within normal limits. Status post coronary bypass graft. Atherosclerosis of thoracic aorta is noted. No pneumothorax or pleural effusion is noted. Elevated right hemidiaphragm is noted. Both lungs are clear. The visualized skeletal structures are unremarkable. IMPRESSION: Aortic atherosclerosis.  No acute cardiopulmonary abnormality seen. Electronically Signed   By: JaMarijo ConceptionM.D.   On: 03/14/2016 14:00     LOS: 3 days   GHOren BinetMD  [facility] Pager:[phone] 7PM-7AM, please contact night-coverage [email or web address] Password [email or web address]/12/2016, 9:27 AM

## 2016-03-25 NOTE — H&P (View-Only) (Signed)
Progress Note  Patient Name: Douglas Mccoy Date of Encounter: 04/06/2016  [facility]: [facility]  Subjective   Denies angina, syncope, CHF.Ready for cath this afternoon.  Inpatient Medications    Scheduled Meds: . acyclovir  200 mg Oral BID  . aspirin EC  81 mg Oral Daily  . atorvastatin  80 mg Oral q1800  . carvedilol  12.5 mg Oral BID WC  . gabapentin  600 mg Oral BID  . insulin aspart  0-5 Units Subcutaneous QHS  . insulin aspart  0-9 Units Subcutaneous TID WC  . insulin glargine  15 Units Subcutaneous Daily  . levETIRAcetam  500 mg Oral Daily  . levothyroxine  50 mcg Oral QAC breakfast  . magnesium oxide  400 mg Oral BID  . pneumococcal 23 valent vaccine  0.5 mL Intramuscular Tomorrow-1000  . sertraline  50 mg Oral Daily  . sodium chloride flush  3 mL Intravenous Q12H  . sodium chloride flush  3 mL Intravenous Q12H   Continuous Infusions: . sodium chloride 1 mL/kg/hr (03/24/16 2129)   PRN Meds: sodium chloride, acetaminophen **OR** acetaminophen, albuterol, calcium carbonate, fluticasone, loperamide, ondansetron **OR** ondansetron (ZOFRAN) IV, sodium chloride flush   Vital Signs    Vitals:   03/24/16 0502 03/24/16 1337 03/24/16 2043 03/20/2016 0600  BP: (!) 136/52 (!) 142/58 (!) 148/58 (!) 115/54  Pulse: 60 68 66 (!) 59  Resp: _0 Temp: 97.7 F (36.5 C) 98.2 F (36.8 C) 98.4 F (36.9 C) 97.5 F (36.4 C)  TempSrc: Oral Oral Oral Oral  SpO2: 99% 99% 98% 100%  Weight: 66.7 kg (147 lb 1.6 oz)   67 kg (147 lb 12.8 oz)  Height:        Intake/Output Summary (Last 24 hours) at 04/10/2016 0852 Last data filed at 03/22/2016 0631  Gross per 24 hour  Intake              720 ml  Output              600 ml  Net              120 ml   Filed Weights   03/23/16 0500 03/24/16 0502 03/14/2016 0600  Weight: 67.4 kg (148 lb 8 oz) 66.7 kg (147 lb 1.6 oz) 67 kg (147 lb 12.8 oz)    Telemetry    NSR - Personally Reviewed  ECG    NSR, mild ST depression V4-V6 -  Personally Reviewed  Physical Exam  Comfortable GEN: No acute distress.   Neck: No JVD Cardiac: RRR, no murmurs, rubs, or gallops.  Respiratory: Clear to auscultation bilaterally. GI: Soft, nontender, non-distended  MS: No edema; No deformity. Neuro:  Nonfocal  Psych: Normal affect   Labs    Chemistry Recent Labs Lab 03/19/16 1550 03/20/16 0441  03/22/16 0314 03/24/16 0439 03/26/2016 0201  NA 136 135  < > 134* 135 135  K 4.8 4.4  < > 4.8 4.3 4.3  CL 106 104  < > 104 104 102  CO2 22 22  < > 20* 23 26  GLUCOSE 155* 192*  < > 323* 231* 187*  BUN 22* 19  < > 24* 32* 29*  CREATININE 1.28* 1.24  < > 1.34* 1.25* 1.23  CALCIUM 9.0 9.1  < > 8.7* 8.7* 8.7*  PROT 6.3* 6.0*  --   --   --   --   ALBUMIN 3.5 3.2*  --   --   --   --  AST 19 17  --   --   --   --   ALT 16* 16*  --   --   --   --   ALKPHOS 57 54  --   --   --   --   BILITOT 1.2 0.8  --   --   --   --   GFRNONAA 54* 56*  < > 51* 56* 57*  GFRAA >60 >60  < > 59* >60 >60  ANIONGAP 8 9  < > _0 < > = values in this interval not displayed.   Hematology Recent Labs Lab 03/22/16 0314 03/24/16 0439 03/30/2016 0201  WBC 3.1* 3.9* 4.9  RBC 3.45* 3.78* 3.63*  HGB 9.5* 10.0* 9.5*  HCT 28.0* 31.2* 29.9*  MCV 81.2 82.5 82.4  MCH 27.5 26.5 26.2  MCHC 33.9 32.1 31.8  RDW 17.7* 18.2* 17.9*  PLT 67* 54* 56*    Cardiac EnzymesNo results for input(s): TROPONINI in the last 168 hours.  Recent Labs Lab 03/19/16 1614 03/24/2016 1502  TROPIPOC 0.01 0.08     BNPNo results for input(s): BNP, PROBNP in the last 168 hours.   DDimer No results for input(s): DDIMER in the last 168 hours.   Radiology    No results found.  Cardiac Studies   01/11/2016 ECHO - Left ventricle: The cavity size was normal. Wall thickness was increased in a pattern of mild LVH. The estimated ejection fraction was 55%. Wall motion was normal; there were no regional wall motion abnormalities. Doppler parameters are consistent with abnormal left  ventricular relaxation (grade 1 diastolic dysfunction). - Aortic valve: Trileaflet; moderately calcified leaflets. Sclerosis without stenosis. There was trivial regurgitation. Mean gradient (S): 8 mm Hg. - Mitral valve: Moderately calcified annulus. There was mild regurgitation. - Left atrium: The atrium was mildly dilated. - Right ventricle: The cavity size was normal. Systolic function was normal. - Pulmonary arteries: PA peak pressure: 19 mm Hg (S). - Inferior vena cava: The vessel was normal in size. The respirophasic diameter changes were in the normal range (>= 50%), consistent with normal central venous pressure.  Impressions:  - Normal LV size with mild LV hypertrophy. EF 55%. Normal RV size and systolic function. Aortic sclerosis without significant stenosis. Mild mitral regurgitation.  NUCLEAR STUDY 03/22/2016: Perfusion: Moderate region of the decreased radiotracer activity involving the mid and apical inferior and inferolateral wall consistent with reversible ischemia.  Wall Motion: Normal left ventricular wall motion. No left ventricular dilation. Left Ventricular Ejection Fraction: 53 %  IMPRESSION: 1. Positive for a moderate region of reversible ischemia in the inferior and inferolateral walls of the mid and apical left ventricle.  Patient Profile     71 y.o. male with CAD s/p CABG 2004, exertional angina, preserved LVEF, abnormal perfusion study and recurrent syncope at rest. Important noncardiac issues include multiple myeloma, moderate thrombocytopenia and homeless status.  Assessment & Plan    1. CAD s/p CABG: has documented ischemia and reports exertional angina. For cath today, but would be cautious with PCI due to challenges of DAPT in setting of falls and low platelet count. On the other hand, it will be hard to treat with antianginals due to hypotension. Check lipids. 2. Syncope: pattern is peculiar and most likely due to autonomic neuropathy/hypotension, but  cannot exclude arrhythmia. Consider implantable loop recorder, especially if cath is not helpful.  3. S/P R CEA:   4. CKD stage 2:  Mild, at baseline. Caution with contrast  exposure. 5. MM:  S/p BMT, on Revlimid. 6. Tbpenia:  And mild anemia, stable. 7.  DM 8. Hypothyroidism 9. COPD 10. Homeless, living in [place], hopes to have an apartment in [place] soon.   Signed, Sanda Klein, MD  03/24/2016, 8:52 AM

## 2016-03-25 NOTE — Progress Notes (Signed)
Progress Note  Patient Name: Douglas Mccoy Date of Encounter: 04/06/2016  [facility]: [facility]  Subjective   Denies angina, syncope, CHF.Ready for cath this afternoon.  Inpatient Medications    Scheduled Meds: . acyclovir  200 mg Oral BID  . aspirin EC  81 mg Oral Daily  . atorvastatin  80 mg Oral q1800  . carvedilol  12.5 mg Oral BID WC  . gabapentin  600 mg Oral BID  . insulin aspart  0-5 Units Subcutaneous QHS  . insulin aspart  0-9 Units Subcutaneous TID WC  . insulin glargine  15 Units Subcutaneous Daily  . levETIRAcetam  500 mg Oral Daily  . levothyroxine  50 mcg Oral QAC breakfast  . magnesium oxide  400 mg Oral BID  . pneumococcal 23 valent vaccine  0.5 mL Intramuscular Tomorrow-1000  . sertraline  50 mg Oral Daily  . sodium chloride flush  3 mL Intravenous Q12H  . sodium chloride flush  3 mL Intravenous Q12H   Continuous Infusions: . sodium chloride 1 mL/kg/hr (03/24/16 2129)   PRN Meds: sodium chloride, acetaminophen **OR** acetaminophen, albuterol, calcium carbonate, fluticasone, loperamide, ondansetron **OR** ondansetron (ZOFRAN) IV, sodium chloride flush   Vital Signs    Vitals:   03/24/16 0502 03/24/16 1337 03/24/16 2043 03/15/2016 0600  BP: (!) 136/52 (!) 142/58 (!) 148/58 (!) 115/54  Pulse: 60 68 66 (!) 59  Resp: _0 Temp: 97.7 F (36.5 C) 98.2 F (36.8 C) 98.4 F (36.9 C) 97.5 F (36.4 C)  TempSrc: Oral Oral Oral Oral  SpO2: 99% 99% 98% 100%  Weight: 66.7 kg (147 lb 1.6 oz)   67 kg (147 lb 12.8 oz)  Height:        Intake/Output Summary (Last 24 hours) at 04/10/2016 0852 Last data filed at 03/22/2016 0631  Gross per 24 hour  Intake              720 ml  Output              600 ml  Net              120 ml   Filed Weights   03/23/16 0500 03/24/16 0502 03/14/2016 0600  Weight: 67.4 kg (148 lb 8 oz) 66.7 kg (147 lb 1.6 oz) 67 kg (147 lb 12.8 oz)    Telemetry    NSR - Personally Reviewed  ECG    NSR, mild ST depression V4-V6 -  Personally Reviewed  Physical Exam  Comfortable GEN: No acute distress.   Neck: No JVD Cardiac: RRR, no murmurs, rubs, or gallops.  Respiratory: Clear to auscultation bilaterally. GI: Soft, nontender, non-distended  MS: No edema; No deformity. Neuro:  Nonfocal  Psych: Normal affect   Labs    Chemistry Recent Labs Lab 03/19/16 1550 03/20/16 0441  03/22/16 0314 03/24/16 0439 03/26/2016 0201  NA 136 135  < > 134* 135 135  K 4.8 4.4  < > 4.8 4.3 4.3  CL 106 104  < > 104 104 102  CO2 22 22  < > 20* 23 26  GLUCOSE 155* 192*  < > 323* 231* 187*  BUN 22* 19  < > 24* 32* 29*  CREATININE 1.28* 1.24  < > 1.34* 1.25* 1.23  CALCIUM 9.0 9.1  < > 8.7* 8.7* 8.7*  PROT 6.3* 6.0*  --   --   --   --   ALBUMIN 3.5 3.2*  --   --   --   --  AST 19 17  --   --   --   --   ALT 16* 16*  --   --   --   --   ALKPHOS 57 54  --   --   --   --   BILITOT 1.2 0.8  --   --   --   --   GFRNONAA 54* 56*  < > 51* 56* 57*  GFRAA >60 >60  < > 59* >60 >60  ANIONGAP 8 9  < > _0 < > = values in this interval not displayed.   Hematology Recent Labs Lab 03/22/16 0314 03/24/16 0439 03/27/2016 0201  WBC 3.1* 3.9* 4.9  RBC 3.45* 3.78* 3.63*  HGB 9.5* 10.0* 9.5*  HCT 28.0* 31.2* 29.9*  MCV 81.2 82.5 82.4  MCH 27.5 26.5 26.2  MCHC 33.9 32.1 31.8  RDW 17.7* 18.2* 17.9*  PLT 67* 54* 56*    Cardiac EnzymesNo results for input(s): TROPONINI in the last 168 hours.  Recent Labs Lab 03/19/16 1614 04/08/2016 1502  TROPIPOC 0.01 0.08     BNPNo results for input(s): BNP, PROBNP in the last 168 hours.   DDimer No results for input(s): DDIMER in the last 168 hours.   Radiology    No results found.  Cardiac Studies   01/11/2016 ECHO - Left ventricle: The cavity size was normal. Wall thickness was increased in a pattern of mild LVH. The estimated ejection fraction was 55%. Wall motion was normal; there were no regional wall motion abnormalities. Doppler parameters are consistent with abnormal left  ventricular relaxation (grade 1 diastolic dysfunction). - Aortic valve: Trileaflet; moderately calcified leaflets. Sclerosis without stenosis. There was trivial regurgitation. Mean gradient (S): 8 mm Hg. - Mitral valve: Moderately calcified annulus. There was mild regurgitation. - Left atrium: The atrium was mildly dilated. - Right ventricle: The cavity size was normal. Systolic function was normal. - Pulmonary arteries: PA peak pressure: 19 mm Hg (S). - Inferior vena cava: The vessel was normal in size. The respirophasic diameter changes were in the normal range (>= 50%), consistent with normal central venous pressure.  Impressions:  - Normal LV size with mild LV hypertrophy. EF 55%. Normal RV size and systolic function. Aortic sclerosis without significant stenosis. Mild mitral regurgitation.  NUCLEAR STUDY 03/22/2016: Perfusion: Moderate region of the decreased radiotracer activity involving the mid and apical inferior and inferolateral wall consistent with reversible ischemia.  Wall Motion: Normal left ventricular wall motion. No left ventricular dilation. Left Ventricular Ejection Fraction: 53 %  IMPRESSION: 1. Positive for a moderate region of reversible ischemia in the inferior and inferolateral walls of the mid and apical left ventricle.  Patient Profile     71 y.o. male with CAD s/p CABG 2004, exertional angina, preserved LVEF, abnormal perfusion study and recurrent syncope at rest. Important noncardiac issues include multiple myeloma, moderate thrombocytopenia and homeless status.  Assessment & Plan    1. CAD s/p CABG: has documented ischemia and reports exertional angina. For cath today, but would be cautious with PCI due to challenges of DAPT in setting of falls and low platelet count. On the other hand, it will be hard to treat with antianginals due to hypotension. Check lipids. 2. Syncope: pattern is peculiar and most likely due to autonomic neuropathy/hypotension, but  cannot exclude arrhythmia. Consider implantable loop recorder, especially if cath is not helpful.  3. S/P R CEA:   4. CKD stage 2:  Mild, at baseline. Caution with contrast  exposure. 5. MM:  S/p BMT, on Revlimid. 6. Tbpenia:  And mild anemia, stable. 7.  DM 8. Hypothyroidism 9. COPD 10. Homeless, living in [place], hopes to have an apartment in [place] soon.   Signed, Sanda Klein, MD  03/24/2016, 8:52 AM

## 2016-03-25 NOTE — Interval H&P Note (Signed)
History and Physical Interval Note:  03/24/2016 1:02 PM  Yago Ludvigsen  has presented today for surgery, with the diagnosis of unstable angina  The various methods of treatment have been discussed with the patient and family. After consideration of risks, benefits and other options for treatment, the patient has consented to  Procedure(s): Left Heart Cath and Cors/Grafts Angiography (N/A) as a surgical intervention .  The patient's history has been reviewed, patient examined, no change in status, stable for surgery.  I have reviewed the patient's chart and labs.  Questions were answered to the patient's satisfaction.     Shelva Majestic

## 2016-03-26 ENCOUNTER — Inpatient Hospital Stay (HOSPITAL_COMMUNITY): Payer: Medicare Other

## 2016-03-26 ENCOUNTER — Encounter (HOSPITAL_COMMUNITY): Payer: Self-pay | Admitting: Cardiovascular Disease

## 2016-03-26 ENCOUNTER — Other Ambulatory Visit: Payer: Self-pay | Admitting: *Deleted

## 2016-03-26 DIAGNOSIS — I2510 Atherosclerotic heart disease of native coronary artery without angina pectoris: Secondary | ICD-10-CM

## 2016-03-26 DIAGNOSIS — I6521 Occlusion and stenosis of right carotid artery: Secondary | ICD-10-CM

## 2016-03-26 DIAGNOSIS — I25110 Atherosclerotic heart disease of native coronary artery with unstable angina pectoris: Secondary | ICD-10-CM

## 2016-03-26 DIAGNOSIS — I200 Unstable angina: Secondary | ICD-10-CM

## 2016-03-26 LAB — GLUCOSE, CAPILLARY
GLUCOSE-CAPILLARY: 251 mg/dL — AB (ref 65–99)
Glucose-Capillary: 225 mg/dL — ABNORMAL HIGH (ref 65–99)
Glucose-Capillary: 204 mg/dL — ABNORMAL HIGH (ref 65–99)
Glucose-Capillary: 245 mg/dL — ABNORMAL HIGH (ref 65–99)

## 2016-03-26 LAB — ECHOCARDIOGRAM COMPLETE
AO mean calculated velocity dopler: 116 cm/s
AOVTI: 36.1 cm
AV Area VTI index: 1.36 cm2/m2
AV Peak grad: 12 mmHg
AV VEL mean LVOT/AV: 0.6
AV area mean vel ind: 1.31 cm2/m2
AV peak Index: 1.34
AV pk vel: 172 cm/s
AVG: 6 mmHg
Ao pk vel: 0.61 m/s
Ao-asc: 34 cm
Area-P 1/2: 1.72 cm2
CHL CUP AV VALUE AREA INDEX: 1.36
CHL CUP DOP CALC LVOT VTI: 22.4 cm
CHL CUP LV S' LATERAL: 8.18 cm/s
CHL CUP LVOT MV VTI: 1.91
CHL CUP MV DEC (S): 211
EERAT: 10.41
EWDT: 211 ms
FS: 28 % (ref 28–44)
HEIGHTINCHES: 65 in
IV/PV OW: 1.07
LA vol: 53.1 mL
LADIAMINDEX: 2.08 cm/m2
LASIZE: 36 mm
LAVOLA4C: 53.4 mL
LAVOLIN: 30.7 mL/m2
LEFT ATRIUM END SYS DIAM: 36 mm
LV E/e'average: 10.41
LV TDI E'MEDIAL: 3.37
LVEEMED: 10.41
LVELAT: 8.48 cm/s
LVOT MV VTI INDEX: 1.1 cm2/m2
LVOT area: 3.8 cm2
LVOT diameter: 22 mm
LVOTPV: 105 cm/s
LVOTSV: 85 mL
LVOTVTI: 0.62 cm
MRPISAEROA: 0.03 cm2
MV M vel: 70.1
MV Peak grad: 3 mmHg
MV VTI: 245 cm
MV pk A vel: 122 m/s
MV pk E vel: 88.3 m/s
MVANNULUSVTI: 44.6 cm
MVSPHT: 128 ms
Mean grad: 2 mmHg
PW: 12.1 mm — AB (ref 0.6–1.1)
RV LATERAL S' VELOCITY: 7.5 cm/s
TDI e' lateral: 8.48
WEIGHTICAEL: 2334.4 [oz_av]

## 2016-03-26 LAB — CBC
HEMATOCRIT: 30.5 % — AB (ref 39.0–52.0)
HEMOGLOBIN: 10.1 g/dL — AB (ref 13.0–17.0)
MCH: 27.4 pg (ref 26.0–34.0)
MCHC: 33.1 g/dL (ref 30.0–36.0)
MCV: 82.9 fL (ref 78.0–100.0)
Platelets: 73 10*3/uL — ABNORMAL LOW (ref 150–400)
RBC: 3.68 MIL/uL — ABNORMAL LOW (ref 4.22–5.81)
RDW: 18.3 % — ABNORMAL HIGH (ref 11.5–15.5)
WBC: 6.2 10*3/uL (ref 4.0–10.5)

## 2016-03-26 LAB — BASIC METABOLIC PANEL
ANION GAP: 10 (ref 5–15)
BUN: 23 mg/dL — ABNORMAL HIGH (ref 6–20)
CHLORIDE: 105 mmol/L (ref 101–111)
CO2: 19 mmol/L — AB (ref 22–32)
Calcium: 8.3 mg/dL — ABNORMAL LOW (ref 8.9–10.3)
Creatinine, Ser: 0.96 mg/dL (ref 0.61–1.24)
GFR calc non Af Amer: >60 mL/min (ref >60)
GLUCOSE: 234 mg/dL — AB (ref 65–99)
Potassium: 5 mmol/L (ref 3.5–5.1)
Sodium: 134 mmol/L — ABNORMAL LOW (ref 135–145)

## 2016-03-26 LAB — ABO/RH: ABO/RH(D): A POS

## 2016-03-26 MED ORDER — IOPAMIDOL (ISOVUE-300) INJECTION 61%
INTRAVENOUS | Status: AC
  Administered 2016-03-26: 75 mL
  Filled 2016-03-26: qty 75

## 2016-03-26 MED ORDER — INSULIN GLARGINE 100 UNIT/ML ~~LOC~~ SOLN
20.0000 [IU] | Freq: Every day | SUBCUTANEOUS | Status: DC
  Administered 2016-03-27: 20 [IU] via SUBCUTANEOUS
  Filled 2016-03-26 (×2): qty 0.2

## 2016-03-26 NOTE — Care Management Important Message (Signed)
Important Message  Patient Details  Name: Douglas Mccoy MRN: [id number] Date of Birth: 1945-06-02   Medicare Important Message Given:  Yes    Nathen May 03/26/2016, 12:36 PM

## 2016-03-26 NOTE — Progress Notes (Signed)
 Progress Note  Patient Name: Douglas Mccoy Date of Encounter: 03/26/2016  [facility] Cardiologist: [facility]  Subjective   Patient is feeling well; denies chest pain, SOB, and palpitations.   Inpatient Medications    Scheduled Meds: . acyclovir  200 mg Oral BID  . aspirin EC  81 mg Oral Daily  . atorvastatin  80 mg Oral q1800  . carvedilol  12.5 mg Oral BID WC  . gabapentin  600 mg Oral BID  . insulin aspart  0-5 Units Subcutaneous QHS  . insulin aspart  0-9 Units Subcutaneous TID WC  . insulin glargine  15 Units Subcutaneous Daily  . levETIRAcetam  500 mg Oral Daily  . levothyroxine  50 mcg Oral QAC breakfast  . magnesium oxide  400 mg Oral BID  . pneumococcal 23 valent vaccine  0.5 mL Intramuscular Tomorrow-1000  . sertraline  50 mg Oral Daily  . sodium chloride flush  3 mL Intravenous Q12H  . sodium chloride flush  3 mL Intravenous Q12H   Continuous Infusions: . sodium chloride 125 mL/hr at 04/11/2016 2128   PRN Meds: sodium chloride, acetaminophen **OR** acetaminophen, acetaminophen, albuterol, calcium carbonate, diazepam, fluticasone, loperamide, ondansetron (ZOFRAN) IV, ondansetron **OR** [DISCONTINUED] ondansetron (ZOFRAN) IV, sodium chloride flush   Vital Signs    Vitals:   03/31/2016 1600 04/13/2016 1814 03/29/2016 2041 03/26/16 0555  BP: 131/63 (!) 132/58 (!) 145/56 (!) 137/51  Pulse: (!) 59 60 66 63  Resp:   18 18  Temp:   98.2 F (36.8 C) 98.1 F (36.7 C)  TempSrc:   Oral Oral  SpO2:    97%  Weight:    145 lb 14.4 oz (66.2 kg)  Height:        Intake/Output Summary (Last 24 hours) at 03/26/16 0805 Last data filed at 03/26/16 0559  Gross per 24 hour  Intake              700 ml  Output             1150 ml  Net             -450 ml   Filed Weights   03/24/16 0502 03/20/2016 0600 03/26/16 0555  Weight: 147 lb 1.6 oz (66.7 kg) 147 lb 12.8 oz (67 kg) 145 lb 14.4 oz (66.2 kg)     Physical Exam   General: Well developed, well nourished, male appearing in no acute  distress. Head: Normocephalic, atraumatic.  Neck: Supple without bruits, JVD Lungs:  Resp regular and unlabored, CTA. Heart: RRR, S1, S2, no murmur; no rub. Abdomen: Soft, non-tender, non-distended with normoactive bowel sounds. No hepatomegaly. No rebound/guarding. No obvious abdominal masses. Extremities: No clubbing, cyanosis, No edema. Distal pedal pulses are 2+ bilaterally. Left radial artery cath site without bleeding, dressing in place. Neuro: Alert and oriented X 3. Moves all extremities spontaneously. Psych: Normal affect.  Labs    Chemistry Recent Labs Lab 03/19/16 1550 03/20/16 0441  03/24/16 0439 04/04/2016 0201 03/26/16 0234  NA 136 135  < > 135 135 134*  K 4.8 4.4  < > 4.3 4.3 5.0  CL 106 104  < > 104 102 105  CO2 22 22  < > 23 26 19*  GLUCOSE 155* 192*  < > 231* 187* 234*  BUN 22* 19  < > 32* 29* 23*  CREATININE 1.28* 1.24  < > 1.25* 1.23 0.96  CALCIUM 9.0 9.1  < > 8.7* 8.7* 8.3*  PROT 6.3* 6.0*  --   --   --   --     ALBUMIN 3.5 3.2*  --   --   --   --   AST 19 17  --   --   --   --   ALT 16* 16*  --   --   --   --   ALKPHOS 57 54  --   --   --   --   BILITOT 1.2 0.8  --   --   --   --   GFRNONAA 54* 56*  < > 56* 57* >60  GFRAA >60 >60  < > >60 >60 >60  ANIONGAP 8 9  < > _0 < > = values in this interval not displayed.   Hematology Recent Labs Lab 03/24/16 0439 03/26/2016 0201 03/26/16 0234  WBC 3.9* 4.9 6.2  RBC 3.78* 3.63* 3.68*  HGB 10.0* 9.5* 10.1*  HCT 31.2* 29.9* 30.5*  MCV 82.5 82.4 82.9  MCH 26.5 26.2 27.4  MCHC 32.1 31.8 33.1  RDW 18.2* 17.9* 18.3*  PLT 54* 56* 73*    Cardiac EnzymesNo results for input(s): TROPONINI in the last 168 hours.  Recent Labs Lab 03/19/16 1614 04/07/2016 1502  TROPIPOC 0.01 0.08     BNPNo results for input(s): BNP, PROBNP in the last 168 hours.   DDimer No results for input(s): DDIMER in the last 168 hours.   Radiology    No results found.   Telemetry    NSR in the 70s - Personally  Reviewed  ECG    NSR, mild ST depression V4-V6 - Personally Reviewed   Cardiac Studies   03/26/16 Echocardiogram: pending  01/11/2016 ECHO: - Left ventricle: The cavity size was normal. Wall thickness wasincreased in a pattern of mild LVH. The estimated ejectionfraction was 55%. Wall motion was normal; there were no regionalwall motion abnormalities. Doppler parameters are consistent withabnormal left ventricular relaxation (grade 1 diastolicdysfunction). - Aortic valve: Trileaflet; moderately calcified leaflets.Sclerosis without stenosis. There was trivial regurgitation. Meangradient (S): 8 mm Hg. - Mitral valve: Moderately calcified annulus. There was mildregurgitation. - Left atrium: The atrium was mildly dilated. - Right ventricle: The cavity size was normal. Systolic functionwas normal. - Pulmonary arteries: PA peak pressure: 19 mm Hg (S). - Inferior vena cava: The vessel was normal in size. Therespirophasic diameter changes were in the normal range (>= 50%),consistent with normal central venous pressure.  Impressions:  - Normal LV size with mild LV hypertrophy. EF 55%. Normal RV sizeand systolic function. Aortic sclerosis without significantstenosis. Mild mitral regurgitation.   NUCLEAR STUDY 03/22/2016: Perfusion: Moderate region of the decreased radiotracer activity involving the mid and apical inferior and inferolateral wall consistent with reversible ischemia.  Wall Motion: Normal left ventricular wall motion. No left ventricular dilation. Left Ventricular Ejection Fraction: 53 %  IMPRESSION: 1. Positive for a moderate region of reversible ischemia in the inferior and inferolateral walls of the mid and apical left ventricle.  Left Heart Catheterization 03/27/2016:  Ost RCA to Prox RCA lesion, 95 %stenosed.  Prox RCA lesion, 100 %stenosed.  Ost 1st Mrg to 1st Mrg lesion, 70 %stenosed.  Ost Cx lesion, 40 %stenosed.  Ost LAD to Prox LAD lesion, 90  %stenosed.  Mid LAD lesion, 100 %stenosed.  SVG.  Origin lesion, 100 %stenosed.  Origin lesion, 100 %stenosed.  Origin to Prox Graft lesion, 100 %stenosed.  Dist LAD lesion, 20 %stenosed.  Ost LM to LM lesion, 30 %stenosed.   Severe native CAD with 30% distal left main stenosis, 90% proximal LAD stenosis before the first diagonal  followed by an aneurysmal segment proximal to a large septal perforating artery and beyond this septal branch the LAD is totally occluded; 30% ostial circumflex stenosis with 70% diffuse proximal stenosis in the OM1 branch; 95% ostial and 100% mid RCA occlusion.  Patent LIMA which seems to supply a small second diagonal vessel and mid LAD.  There is suggestion of mild 20% narrowing beyond the LAD anastomosis.  Occlusion of all 3 grafts, which arise from the aorta of which the specific anastomoses are uncertain.  The diagram depicted above is a presumption of the most likely locations where the grafts may have supplied.  Mildly dilated aortic root with mild calcification and slight restriction of motion in the aortic valve.  Patient Profile     71 y.o. male with CAD s/p CABG 2004, exertional angina, preserved LVEF, abnormal perfusion study and recurrent syncope at rest. Important noncardiac issues include multiple myeloma, moderate thrombocytopenia and homeless status.  Assessment & Plan    1. CAD s/p CABG: - lipid panel with cholesterol 68; LDL 23 - myoview with reversible ischemia - heart catheterization with graft occlusions but patent LIMA; no PCI completed - would consider consulting CT surgery for possible re-do CABG given his multi-vessel disease   2. Syncope: pattern is peculiar and most likely due to autonomic neuropathy/hypotension, but cannot exclude arrhythmia. Echo pending. No arrhythmias observed on telemetry. Would still consider implantable loop recorder for syncopal episodes  3. S/P R CEA    4. CKD stage 2:  Mild, at baseline.  Caution with contrast exposure.  5. MM:  S/p BMT, on Revlimid.  6. Tbpenia:  And mild anemia, stable.  7.  DM  8. Hypothyroidism  9. COPD  10. Homeless, living in [place], hopes to have an apartment in [place] soon.  Signed, Ledora Bottcher , PA-C 8:05 AM 03/26/2016 Pager: [phone]  I have seen and examined the patient along with Ledora Bottcher , PA-C.  I have reviewed the chart, notes and new data.  I agree with PA's note.  Key new complaints: no angina Key examination changes: no overt CHF; no arrhythmia; healthy L radial cath site Key new findings / data: reviewed angiography findings. Total occlusion of RCA and SVG-RCA with good L-R collaterals and good distal targets; high-grade LMCA and ostial LCX with occluded SVG to LCX; occluded proximal LAD with patent LIMA, but LIMA feeds a very small segment of mid LAD and diagonal branch. Reviewed preliminary echo at bedside. Wall motion remains normal, as does overall EF.  PLAN: Consult CT surgery for redo CABG, which would be a high risk proposition due to second sternotomy, previous use of LIMA, thrombocytopenia and comorbid conditions. No good PCI options due to CTO-RCA and no real options for medical therapy due to hypotension/relative bradycardia. Consider low dose midodrine and implantable loop recorder for syncope if surgery is not planned.  Sanda Klein, MD, [place] [phone] 03/26/2016, 9:05 AM

## 2016-03-26 NOTE — Progress Notes (Signed)
  Echocardiogram 2D Echocardiogram has been performed.  Douglas Mccoy 03/26/2016, 9:41 AM

## 2016-03-26 NOTE — Consult Note (Signed)
[name]       [place] [address]             [phone]        Douglas Mccoy Talty Medical Record #[id number] Date of Birth: 07-03-1945  Referring: Dr Sallyanne Kuster Primary Care: [name]  Chief Complaint:    Chief Complaint  Patient presents with  . Hypotension    pt coming in for eval of low HR and hypotension has been seen multiple times for both   Patient examined, coronary angiogram and most recent 2-D echocardiogram personally reviewed and counseled with patient   History of Present Illness:     71 year old Caucasian male presents with syncopal episodes which have been recurrent over the past few weeks. They have at times been associated with losing consciousness while eating a meal as well as other times. He has had dyspnea and chest tightness on exertion.  The patient had multivessel CABG at the [facility] 2004. He had combined right carotid endarterectomy. No cardiology follow-up or subsequent catheterizations. Stress test performed on current admission for syncope was positive for ischemia in the inferior-anterior wall. EF was 54% on perfusion scan. Cardiac catheterization demonstrates 80% left main stenosis, occluded vein grafts, patent left IMA to a small LAD. The OM1 and posterior sending appear to be adequate targets. The proximal  native RCA is occluded. Echo shows EF 50-55 percent with mild MR, some calcification aortic valve without significant stenosis or insufficiency.  The patient states he did well after his combined CABG-right carotid endarterectomy and was hospitalized approximately one week. 5 years ago the patient was diagnosed with myeloma and underwent intensive chemotherapy followed by bone marrow transplant at the [facility] [place]. He is followed by hematology at the [facility] clinic and states he is in remission. He does have a baseline platelet count of 60-70k, without clinical bleeding problems. Cardiac catheterization via  left wrist was without bleeding, location.  The patient currently is homeless and lives in the [place] sponsored shelter in [place]. He has sisters live in [place]..  The patient is right-hand dominant Patient had previous cholecystectomy without complication The patient denies venous disease of his left leg. Saphenous vein was harvested from his right leg endoscopically for his CABG in 2004   Current Activity/ Functional Status: Patient has a sedentary lifestyle at the [facility] sponsored shelter. He lives in his own room and can ambulate 1 block without becoming short of breath   Zubrod Score: At the time of surgery this patient's most appropriate activity status/level should be described as: _0     0    Normal activity, no symptoms _1     1    Restricted in physical strenuous activity but ambulatory, able to do out light work _2     2    Ambulatory and capable of self care, unable to do work activities, up and about                 more than 50%  Of the time                            _3     3    Only limited self care, in bed greater than 50% of waking hours _4     4    Completely disabled, no self care, confined to bed or chair _5     5    Moribund  Past Medical History:  Diagnosis Date  . Anemia   . Anxiety   . Arthritis    "touch in my hands" (01/12/2016)  . Bradycardia   . CAD (coronary artery disease)   . Chronic back pain    "upper and lower" (01/12/2016)  . Chronic bronchitis ([place])   . Chronic kidney disease    "I'm on a renal diet" (01/12/2016)  . COPD (chronic obstructive pulmonary disease) ([place])   . Depression   . GERD (gastroesophageal reflux disease)   . Heart murmur    "when I was little"  . History of fractured vertebra    "between shoulder blades"  . History of hiatal hernia   . History of stem cell transplant ([place])   . Hyperlipidemia   . Hypertension   . Hypotension   . Hypothyroidism   . Multiple myeloma ([place])   . Myocardial infarction     "I've had 2" (01/12/2016)  . Seizures ([place]) 2017   "when my potassium got too high" (01/12/2016)  . Shingles   . Sleep apnea    "I don't have a mask right now" (01/12/2016)  . Stroke ([place])    "I've had 2; fingers stay colder" (pointes to left hand; 3rd, 4th, and 5th digits" (01/12/2016)  . Type II diabetes mellitus ([place])     Past Surgical History:  Procedure Laterality Date  . AORTOGRAM  03/17/2016   Procedure: Aortogram;  Surgeon: Troy Sine, MD;  Location: [facility];  Service: Cardiovascular;;  . CAROTID ARTERY ANGIOPLASTY Right    Right carotid artery "cleaned out"  . CORONARY ARTERY BYPASS GRAFT  2004   CABG X4"  . CORONARY/GRAFT ANGIOGRAPHY N/A 03/18/2016   Procedure: Coronary/Graft Angiography;  Surgeon: Troy Sine, MD;  Location: [facility];  Service: Cardiovascular;  Laterality: N/A;  . LAPAROSCOPIC CHOLECYSTECTOMY    . TONSILLECTOMY  ~ 1950    History  Smoking Status  . Former Smoker  . Packs/day: 2.00  . Years: 35.00  . Types: Cigarettes  . Quit date: 04/08/1988  Smokeless Tobacco  . Never Used    History  Alcohol Use No  Has not had alcohol in 25 years  Social History   Social History  . Marital status: Divorced    Spouse name: N/A  . Number of children: N/A  . Years of education: N/A   Occupational History  . Not on file.   Social History Main Topics  . Smoking status: Former Smoker    Packs/day: 2.00    Years: 35.00    Types: Cigarettes    Quit date: 04/08/1988  . Smokeless tobacco: Never Used  . Alcohol use No  . Drug use: No  . Sexual activity: Not Currently   Other Topics Concern  . Not on file   Social History Narrative  . No narrative on file    No Known Allergies  Current Facility-Administered Medications  Medication Dose Route Frequency Provider Last Rate Last Dose  . 0.9 %  sodium chloride infusion   Intravenous Continuous Troy Sine, MD   Stopped at 03/26/16 1018  . 0.9 %  sodium chloride infusion   250 mL Intravenous PRN Troy Sine, MD      . acetaminophen (TYLENOL) tablet 650 mg  650 mg Oral Q6H PRN Radene Gunning, NP       Or  . acetaminophen (TYLENOL) suppository 650 mg  650 mg Rectal Q6H PRN Radene Gunning, NP      .  acetaminophen (TYLENOL) tablet 650 mg  650 mg Oral Q4H PRN Troy Sine, MD      . acyclovir (ZOVIRAX) 200 MG capsule 200 mg  200 mg Oral BID Lezlie Octave Black, NP   200 mg at 03/26/16 1016  . albuterol (PROVENTIL) (2.5 MG/3ML) 0.083% nebulizer solution 2.5 mg  2.5 mg Inhalation Q6H PRN Jonetta Osgood, MD      . aspirin EC tablet 81 mg  81 mg Oral Daily Lezlie Octave Black, NP   81 mg at 03/26/16 1016  . atorvastatin (LIPITOR) tablet 80 mg  80 mg Oral q1800 Eileen Stanford, PA-C   80 mg at 03/16/2016 1814  . calcium carbonate (TUMS - dosed in mg elemental calcium) chewable tablet 200 mg of elemental calcium  1 tablet Oral Q6H PRN Ritta Slot, NP   200 mg of elemental calcium at 03/22/16 2000  . carvedilol (COREG) tablet 12.5 mg  12.5 mg Oral BID WC Eileen Stanford, PA-C   12.5 mg at 03/26/16 1016  . diazepam (VALIUM) tablet 5 mg  5 mg Oral Q6H PRN Troy Sine, MD      . fluticasone [facility]) 50 MCG/ACT nasal spray 2 spray  2 spray Each Nare Daily PRN Radene Gunning, NP      . gabapentin (NEURONTIN) tablet 600 mg  600 mg Oral BID Jonetta Osgood, MD   600 mg at 03/26/16 1016  . insulin aspart (novoLOG) injection 0-5 Units  0-5 Units Subcutaneous QHS Radene Gunning, NP   4 Units at 03/31/2016 2126  . insulin aspart (novoLOG) injection 0-9 Units  0-9 Units Subcutaneous TID WC Radene Gunning, NP   3 Units at 03/26/16 [phone]  . [START ON 03/27/2016] insulin glargine (LANTUS) injection 20 Units  20 Units Subcutaneous Daily Jonetta Osgood, MD      . levETIRAcetam (KEPPRA XR) 24 hr tablet 500 mg  500 mg Oral Daily Lezlie Octave Black, NP   500 mg at 03/26/16 1016  . levothyroxine (SYNTHROID, LEVOTHROID) tablet 50 mcg  50 mcg Oral QAC breakfast Radene Gunning, NP   50 mcg at 03/26/16 0610    . loperamide (IMODIUM) capsule 2 mg  2 mg Oral PRN Jonetta Osgood, MD   2 mg at 03/26/16 1016  . magnesium oxide (MAG-OX) tablet 400 mg  400 mg Oral BID Radene Gunning, NP   400 mg at 03/26/16 1016  . ondansetron (ZOFRAN) injection 4 mg  4 mg Intravenous Q6H PRN Troy Sine, MD      . ondansetron [facility]) tablet 4 mg  4 mg Oral Q6H PRN Radene Gunning, NP      . pneumococcal 23 valent vaccine (PNU-IMMUNE) injection 0.5 mL  0.5 mL Intramuscular Tomorrow-1000 Jonetta Osgood, MD      . sertraline (ZOLOFT) tablet 50 mg  50 mg Oral Daily Lezlie Octave Black, NP   50 mg at 03/26/16 1016  . sodium chloride flush (NS) 0.9 % injection 3 mL  3 mL Intravenous Q12H Lezlie Octave Black, NP   3 mL at 03/24/16 1000  . sodium chloride flush (NS) 0.9 % injection 3 mL  3 mL Intravenous Q12H Troy Sine, MD      . sodium chloride flush (NS) 0.9 % injection 3 mL  3 mL Intravenous PRN Troy Sine, MD        Prescriptions Prior to Admission  Medication Sig Dispense Refill Last Dose  . naproxen sodium (  ANAPROX) 220 MG tablet Take 220 mg by mouth 2 (two) times daily as needed (pain).   Past Month at Unknown time  . [DISCONTINUED] acyclovir (ZOVIRAX) 200 MG capsule Take 200 mg by mouth 2 (two) times daily.   03/19/2016 at Unknown time  . [DISCONTINUED] albuterol (PROVENTIL HFA;VENTOLIN HFA) 108 (90 Base) MCG/ACT inhaler Inhale 2 puffs into the lungs 3 (three) times daily.    rescue at rescue  . [DISCONTINUED] aspirin EC 81 MG tablet Take 81 mg by mouth daily.   04/09/2016 at Unknown time  . [DISCONTINUED] fluticasone (FLONASE) 50 MCG/ACT nasal spray Place 2 sprays into both nostrils daily. (Patient taking differently: Place 2 sprays into both nostrils daily as needed for allergies. ) 16 g 2 Past Week at Unknown time  . [DISCONTINUED] gabapentin (NEURONTIN) 600 MG tablet Take 600 mg by mouth 2 (two) times daily.   03/14/2016 at Unknown time  . [DISCONTINUED] insulin aspart (NOVOLOG) 100 UNIT/ML injection Inject 0-10 Units into  the skin 3 (three) times daily with meals. Inject as per sliding scale: 0 - 139=  0 units;  140 - 199 = 2 units;  200 - 250= 4 units;  251 - 299= 6 units;  300 - 349= 8 units;  350 - 450= 10 units  Call MD for CBG less than 60 or greater than 450   03/15/2016 at Unknown time  . [DISCONTINUED] lenalidomide (REVLIMID) 15 MG capsule Take 15 mg by mouth daily.   Past Week at Unknown time  . [DISCONTINUED] levETIRAcetam (KEPPRA XR) 500 MG 24 hr tablet Take 1 tablet (500 mg total) by mouth daily. 30 tablet 0 04/05/2016 at Unknown time  . [DISCONTINUED] levothyroxine (SYNTHROID) 50 MCG tablet Take 1 tablet (50 mcg total) by mouth daily before breakfast. 30 tablet 0 04/04/2016 at Unknown time  . [DISCONTINUED] Magnesium Oxide 420 MG TABS Take 840 mg by mouth 2 (two) times daily.   03/23/2016 at Unknown time  . [DISCONTINUED] Multiple Vitamins-Minerals (CENTRUM SILVER ADULT 50+) TABS Take 1 tablet by mouth daily.   03/16/2016 at Unknown time  . [DISCONTINUED] sertraline (ZOLOFT) 50 MG tablet Take 50 mg by mouth daily.   04/12/2016 at Unknown time  . [DISCONTINUED] tiZANidine (ZANAFLEX) 4 MG tablet Take 4 mg by mouth 2 (two) times daily.    03/24/2016 at Unknown time    Family History  Problem Relation Age of Onset  . Diabetes Mother   . Cancer Neg Hx   . Stroke Neg Hx      Review of Systems:       Cardiac Review of Systems: Y or N  Chest Pain [ On exertion   ]  Resting SOB [ no  ] Exertional SOB  [place]  ]  Orthopnea [no  ]   Pedal Edema [ no  ]    Palpitations [ no ] Syncope  [  ]   Presyncope [   ]  General Review of Systems: [Y] = yes [  ]=no Constitional: recent weight change [  ]; anorexia [  ]; fatigue [place]  ]; nausea [  ]; night sweats [  ]; fever [  ]; or chills [  ]  Dental: poor dentition[  ]; Last Dentist visit: Edentulous  Eye : blurred vision [  ]; diplopia [   ]; vision changes [  ];  Amaurosis fugax[  ]; Resp: cough [  ];   wheezing[  ];  hemoptysis[  ]; shortness of breath[ yes  ]; paroxysmal nocturnal dyspnea[  ]; dyspnea on exertion[ yes]; or orthopnea[  ];  GI:  gallstones[ status post cholecystectomy ], vomiting[  ];  dysphagia[  ]; melena[  ];  hematochezia [  ]; heartburn[  ];   Hx of  Colonoscopy[  ]; GU: kidney stones [  ]; hematuria[  ];   dysuria [  ];  nocturia yes[  ];  history of     obstruction [  ]; urinary frequency [  ]             Skin: rash, swelling[  ];, hair loss[  ];  peripheral edema[  ];  or itching[  ]; Musculosketetal: myalgias[  yes];  joint swelling[  ];  joint erythema[  ];  joint pain[  ];  back pain[  ];  Heme/Lymph: bruising[  ];  bleeding[  ];  anemia[  ];           history of bone marrow transplant for myeloma 5 years ago Neuro: TIA[  ];  headaches[  ];  stroke[ yes before right carotid endarterectomy 2004 ];  vertigo[  ];  seizures[  ];   paresthesias[  ];  difficulty walking[  ];  Psych:depression[  ]; anxiety[  ];  Endocrine: diabetes[ yes  ];  thyroid dysfunction[  ];  Immunizations: Flu [  ]; Pneumococcal[  ];  Other:  Physical Exam: BP (!) 146/54   Pulse 61   Temp 98.1 F (36.7 C) (Oral)   Resp 18   Ht _0  (1.651 m)   Wt 145 lb 14.4 oz (66.2 kg)   SpO2 97%   BMI 24.28 kg/m        Physical Exam  General: Short elderly Caucasian male no acute distress HEENT: Normocephalic pupils equal , dentition adequate Neck: Supple without JVD, adenopathy, or bruit Chest: Clear to auscultation, symmetrical breath sounds, no rhonchi, no tenderness             or deformity. Well-healed sternal incision Cardiovascular: Regular rate and rhythm, no murmur, no gallop, peripheral pulses             palpable in all extremities Abdomen:  Soft, nontender, no palpable mass or organomegaly Extremities: Warm, well-perfused, no clubbing cyanosis edema or tenderness, surgical scar right lobe of the knee from vein harvest           No varicosities,   no venous stasis changes of the  legs Rectal/GU: Deferred Neuro: Grossly non--focal and symmetrical throughout Skin: Clean and dry without rash or ulceration   Diagnostic Studies & Laboratory data:     Recent Radiology Findings:   No results found.   I have independently reviewed the above radiologic studies.  Recent Lab Findings: Lab Results  Component Value Date   WBC 6.2 03/26/2016   HGB 10.1 (L) 03/26/2016   HCT 30.5 (L) 03/26/2016   PLT 73 (L) 03/26/2016   GLUCOSE 234 (H) 03/26/2016   CHOL 68 04/05/2016   TRIG 69 04/07/2016   HDL 31 (L) 03/14/2016   LDLCALC 23 03/26/2016   ALT 16 (L) 03/20/2016   AST 17 03/20/2016   NA 134 (L) 03/26/2016   K 5.0 03/26/2016  CL 105 03/26/2016   CREATININE 0.96 03/26/2016   BUN 23 (H) 03/26/2016   CO2 19 (L) 03/26/2016   TSH 5.763 (H) 03/19/2016   INR 1.18 01/01/2016   HGBA1C 9.1 (H) 03/20/2016      Assessment / Plan:     Diabetes mellitus    Severe three-vessel coronary artery disease status post previous CABG 2004     Preserved LV function     Thrombocytopenia from previous chemotherapy and bone marrow transplant for myeloma 2013  The patient is a potential candidate for redo CABG with adequate targets in the circumflex and RCA distribution. LV function is adequate. Prior to proceeding with surgery we will obtain vein mapping to document adequacy of conduit, check carotid Dopplers as he's had previous documented carotid disease and obtain CT of chest because of calcification of the ascending aorta. Possible redo CABG on Thursday, March 15.  I  _0 @ 03/26/2016 11:04 AM

## 2016-03-26 NOTE — Progress Notes (Signed)
PROGRESS NOTE        PATIENT DETAILS Name: Douglas Mccoy Age: 71 y.o. Sex: male Date of Birth: December 31, 1945 Admit Date: 03/26/2016 Admitting Physician Waldemar Dickens, MD PCP:[facility]  Brief Narrative: Patient is a 71 y.o. 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  Subjective: No chest pain or shortness of breath.   Assessment/Plan: Unstable angina: Known history of CAD, recent echo showed normal LV systolic function. Evaluated by cardiology, underwent nuclear stress test which was positive for ischemia . Underwent initial HC on 3/12 with findings of multivessel disease, cardiology has consulted cardiothoracic surgery to see if he is a candidate for redo CABG.  Recurrent syncope: Suspect syncope likely from autonomic dysfunction-cardiology planning on loop recorder/event monitor if not a candidate for CABG.  History of hypertension: Blood pressure currently controlled, currently not on any antihypertensive regimen. Follow closely in the outpatient setting  Insulin-dependent type 2 diabetes: A1c is 9.1, CBGs still not optimal-will increase Lantus to 20 units, continue SSI and follow   History of CAD status post CABG: Continue aspirin, will need to follow platelet count closely  History of COPD/asthma: Lungs are clear-no evidence of exacerbation, continue usual bronchodilator regimen  Anemia/mild thrombocytopenia: Probably due to underlying multiple myeloma. Cardiology has consulted hematology over the phone on 3/9. Continue Revlimid.  History of seizures: Continue Keppra. Patient no longer drives-and he is aware that he is not to drive in the future, without clearance from his primary neurologist.  DVT Prophylaxis:  SCD's  Code Status: Full  code  Family Communication: None at bedside  Disposition Plan: Remain inpatient-home in the next few days  Antimicrobial agents: Anti-infectives    Start     Dose/Rate Route Frequency Ordered Stop   03/22/16 0000  acyclovir (ZOVIRAX) 200 MG capsule     200 mg Oral 2 times daily 03/22/16 1324     03/18/2016 2200  acyclovir (ZOVIRAX) 200 MG capsule 200 mg     200 mg Oral 2 times daily 04/09/2016 1531        Procedures: None  CONSULTS:  cardiology  Time spent: 25 minutes-Greater than 50% of this time was spent in counseling, explanation of diagnosis, planning of further management, and coordination of care.  MEDICATIONS: Scheduled Meds: . acyclovir  200 mg Oral BID  . aspirin EC  81 mg Oral Daily  . atorvastatin  80 mg Oral q1800  . carvedilol  12.5 mg Oral BID WC  . gabapentin  600 mg Oral BID  . insulin aspart  0-5 Units Subcutaneous QHS  . insulin aspart  0-9 Units Subcutaneous TID WC  . insulin glargine  15 Units Subcutaneous Daily  . levETIRAcetam  500 mg Oral Daily  . levothyroxine  50 mcg Oral QAC breakfast  . magnesium oxide  400 mg Oral BID  . pneumococcal 23 valent vaccine  0.5 mL Intramuscular Tomorrow-1000  . sertraline  50 mg Oral Daily  . sodium chloride flush  3 mL Intravenous Q12H  . sodium chloride flush  3 mL Intravenous Q12H   Continuous Infusions: . sodium chloride Stopped (03/26/16 1018)   PRN Meds:.sodium chloride, acetaminophen **OR** acetaminophen, acetaminophen, albuterol, calcium carbonate, diazepam, fluticasone, loperamide, ondansetron (ZOFRAN) IV,  ondansetron **OR** [DISCONTINUED] ondansetron (ZOFRAN) IV, sodium chloride flush   PHYSICAL EXAM: Vital signs: Vitals:   03/30/2016 1814 04/07/2016 2041 03/26/16 0555 03/26/16 1016  BP: (!) 132/58 (!) 145/56 (!) 137/51 (!) 146/54  Pulse: 60 66 63 61  Resp:  18 18   Temp:  98.2 F (36.8 C) 98.1 F (36.7 C)   TempSrc:  Oral Oral   SpO2:   97%   Weight:   66.2 kg (145 lb 14.4 oz)   Height:        Filed Weights   03/24/16 0502 03/31/2016 0600 03/26/16 0555  Weight: 66.7 kg (147 lb 1.6 oz) 67 kg (147 lb 12.8 oz) 66.2 kg (145 lb 14.4 oz)   Body mass index is 24.28 kg/m.   General appearance :Awake, alert, not in any distress. Speech Clear. Eyes:, pupils equally reactive to light and accomodation,no scleral icterus. HEENT: Atraumatic and Normocephalic Neck: supple, no JVD. No cervical lymphadenopathy. Resp:Good air entry bilaterally, no added sounds  CVS: S1 S2 regular, no murmurs.  GI: Bowel sounds present, Non tender and not distended with no gaurding, rigidity or rebound. Extremities: B/L Lower Ext shows no edema, both legs are warm to touch Neurology:  speech clear,Non focal, sensation is grossly intact. Psychiatric: Normal judgment and insight. Alert and oriented x 3. Normal mood. Musculoskeletal:No digital cyanosis Skin:No Rash, warm and dry Wounds:N/A  I have personally reviewed following labs and imaging studies  LABORATORY DATA: CBC:  Recent Labs Lab 03/19/16 1550  03/20/2016 1436 03/22/16 0314 03/24/16 0439 03/22/2016 0201 03/26/16 0234  WBC 4.1  < > 4.6 3.1* 3.9* 4.9 6.2  NEUTROABS 2.5  --  3.6  --   --   --   --   HGB 9.2*  < > 9.6* 9.5* 10.0* 9.5* 10.1*  HCT 27.7*  < > 29.4* 28.0* 31.2* 29.9* 30.5*  MCV 82.2  < > 81.9 81.2 82.5 82.4 82.9  PLT 72*  < > 65* 67* 54* 56* 73*  < > = values in this interval not displayed.  Basic Metabolic Panel:  Recent Labs Lab 03/18/2016 1436 03/22/16 0314 03/24/16 0439 03/30/2016 0201 03/26/16 0234  NA 136 134* 135 135 134*  K 4.6 4.8 4.3 4.3 5.0  CL 104 104 104 102 105  CO2 23 20* 23 26 19*  GLUCOSE 305* 323* 231* 187* 234*  BUN 17 24* 32* 29* 23*  CREATININE 1.41* 1.34* 1.25* 1.23 0.96  CALCIUM 8.6* 8.7* 8.7* 8.7* 8.3*    GFR: Estimated Creatinine Clearance: 60.5 mL/min (by C-G formula based on SCr of 0.96 mg/dL).  Liver Function Tests:  Recent Labs Lab 03/19/16 1550 03/20/16 0441  AST 19 17  ALT 16*  16*  ALKPHOS 57 54  BILITOT 1.2 0.8  PROT 6.3* 6.0*  ALBUMIN 3.5 3.2*   No results for input(s): LIPASE, AMYLASE in the last 168 hours. No results for input(s): AMMONIA in the last 168 hours.  Coagulation Profile: No results for input(s): INR, PROTIME in the last 168 hours.  Cardiac Enzymes: No results for input(s): CKTOTAL, CKMB, CKMBINDEX, TROPONINI in the last 168 hours.  BNP (last 3 results) No results for input(s): PROBNP in the last 8760 hours.  HbA1C: No results for input(s): HGBA1C in the last 72 hours.  CBG:  Recent Labs Lab 04/04/2016 0630 04/07/2016 1149 03/31/2016 1638 04/12/2016 2100 03/26/16 0603  GLUCAP 179* 182* 196* 334* 225*    Lipid Profile:  Recent Labs  03/24/2016 0952  CHOL 68  HDL 31*  LDLCALC 23  TRIG 69  CHOLHDL 2.2    Thyroid Function Tests: No results for input(s): TSH, T4TOTAL, FREET4, T3FREE, THYROIDAB in the last 72 hours.  Anemia Panel: No results for input(s): VITAMINB12, FOLATE, FERRITIN, TIBC, IRON, RETICCTPCT in the last 72 hours.  Urine analysis:    Component Value Date/Time   COLORURINE STRAW (A) 03/14/2016 1427   APPEARANCEUR CLEAR 03/14/2016 1427   LABSPEC 1.014 03/14/2016 1427   PHURINE 7.0 03/14/2016 1427   GLUCOSEU >=500 (A) 03/14/2016 1427   HGBUR NEGATIVE 03/14/2016 1427   BILIRUBINUR NEGATIVE 03/14/2016 1427   KETONESUR NEGATIVE 03/14/2016 1427   PROTEINUR NEGATIVE 03/14/2016 1427   NITRITE NEGATIVE 03/14/2016 1427   LEUKOCYTESUR NEGATIVE 03/14/2016 1427    Sepsis Labs: Lactic Acid, Venous    Component Value Date/Time   LATICACIDVEN 1.85 01/10/2016 1129    MICROBIOLOGY: No results found for this or any previous visit (from the past 240 hour(s)).  RADIOLOGY STUDIES/RESULTS: X-ray Chest Pa And Lateral  Result Date: 03/19/2016 CLINICAL DATA:  Respiratory crackles, pneumonia 2 weeks ago. Hypotensive. History of COPD, CABG, hypertension. EXAM: CHEST  2 VIEW COMPARISON:  Chest radiograph March 14, 2016  FINDINGS: Cardiac silhouette is upper limits normal size, calcified tortuous aorta. Status post median sternotomy for CABG with fractured midsternal heroin. Chronically elevated RIGHT hemidiaphragm. No pleural effusion or focal consolidation. No pneumothorax. Soft tissue planes included osseous structures are nonsuspicious. Single level mid thoracic vertebral body cement augmentation. Mild old approximate T11 compression fracture. Surgical clips in the included right abdomen compatible with cholecystectomy. IMPRESSION: Stable examination: Borderline cardiomegaly, no acute pulmonary process. Electronically Signed   By: Elon Alas M.D.   On: 03/19/2016 19:20   Nm Myocar Multi W/spect W/wall Motion / Ef  Result Date: 03/22/2016 CLINICAL DATA:  71 year old male with abnormal EKG EXAM: MYOCARDIAL IMAGING WITH SPECT (REST AND PHARMACOLOGIC-STRESS) GATED LEFT VENTRICULAR WALL MOTION STUDY LEFT VENTRICULAR EJECTION FRACTION TECHNIQUE: Standard myocardial SPECT imaging was performed after resting intravenous injection of 10 mCi Tc-54mtetrofosmin. Subsequently, intravenous infusion of Lexiscan was performed under the supervision of the Cardiology staff. At peak effect of the drug, 30 mCi Tc-972metrofosmin was injected intravenously and standard myocardial SPECT imaging was performed. Quantitative gated imaging was also performed to evaluate left ventricular wall motion, and estimate left ventricular ejection fraction. COMPARISON:  Prior chest x-ray 03/19/2016 FINDINGS: Perfusion: Moderate region of the decreased radiotracer activity involving the mid and apical inferior and inferolateral wall consistent with reversible ischemia. Wall Motion: Normal left ventricular wall motion. No left ventricular dilation. Left Ventricular Ejection Fraction: 53 % End diastolic volume 96 ml End systolic volume 45 ml IMPRESSION: 1. Positive for a moderate region of reversible ischemia in the inferior and inferolateral walls of the  mid and apical left ventricle. 2. Normal left ventricular wall motion. 3. Left ventricular ejection fraction 53% 4. Non invasive risk stratification*: Intermediate *2012 Appropriate Use Criteria for Coronary Revascularization Focused Update: J Am Coll Cardiol. 204166;[email or web address] Electronically Signed   By: HeJacqulynn Cadet.D.   On: 03/22/2016 14:58   Dg Chest Port 1 View  Result Date: 03/14/2016 CLINICAL DATA:  Sepsis. EXAM: PORTABLE CHEST 1 VIEW COMPARISON:  Radiographs of January 11, 2016. FINDINGS: The heart size and mediastinal contours are within normal limits. Status post coronary bypass graft. Atherosclerosis of thoracic aorta is noted. No pneumothorax or pleural effusion is noted. Elevated right hemidiaphragm is noted. Both lungs are clear. The visualized skeletal structures are unremarkable. IMPRESSION: Aortic atherosclerosis.  No acute  cardiopulmonary abnormality seen. Electronically Signed   By: Marijo Conception, M.D.   On: 03/14/2016 14:00     LOS: 4 days   Oren Binet, MD  [facility] Pager:[phone]  If 7PM-7AM, please contact night-coverage [email or web address] Password [facility] 03/26/2016, 10:33 AM

## 2016-03-27 ENCOUNTER — Inpatient Hospital Stay (HOSPITAL_COMMUNITY): Payer: Medicare Other

## 2016-03-27 DIAGNOSIS — Z01810 Encounter for preprocedural cardiovascular examination: Secondary | ICD-10-CM

## 2016-03-27 LAB — PULMONARY FUNCTION TEST
DL/VA % pred: 76 %
DL/VA: 3.25 ml/min/mmHg/L
DLCO cor % pred: 39 %
DLCO cor: 10.22 ml/min/mmHg
DLCO unc % pred: 33 %
DLCO unc: 8.63 ml/min/mmHg
FEF 25-75 Post: 2.14 L/sec
FEF 25-75 Pre: 1.36 L/sec
FEF2575-%Change-Post: 57 %
FEF2575-%Pred-Post: 112 %
FEF2575-%Pred-Pre: 71 %
FEV1-%Change-Post: 5 %
FEV1-%Pred-Post: 69 %
FEV1-%Pred-Pre: 66 %
FEV1-Post: 1.78 L
FEV1-Pre: 1.69 L
FEV1FVC-%Change-Post: 3 %
FEV1FVC-%Pred-Pre: 106 %
FEV6-%Change-Post: 1 %
FEV6-%Pred-Post: 67 %
FEV6-%Pred-Pre: 66 %
FEV6-Post: 2.21 L
FEV6-Pre: 2.17 L
FEV6FVC-%Pred-Post: 107 %
FEV6FVC-%Pred-Pre: 107 %
FVC-%Change-Post: 1 %
FVC-%Pred-Post: 62 %
FVC-%Pred-Pre: 61 %
FVC-Post: 2.21 L
FVC-Pre: 2.17 L
Post FEV1/FVC ratio: 81 %
Post FEV6/FVC ratio: 100 %
Pre FEV1/FVC ratio: 78 %
Pre FEV6/FVC Ratio: 100 %
RV % pred: 40 %
RV: 0.91 L
TLC % pred: 53 %
TLC: 3.23 L

## 2016-03-27 LAB — BLOOD GAS, ARTERIAL
Acid-base deficit: 1.3 mmol/L (ref 0.0–2.0)
Bicarbonate: 22.6 mmol/L (ref 20.0–28.0)
Drawn by: 10006
FIO2: 21
O2 Saturation: 94.6 %
Patient temperature: 98.6
pCO2 arterial: 36.5 mmHg (ref 32.0–48.0)
pH, Arterial: 7.409 (ref 7.350–7.450)
pO2, Arterial: 74.8 mmHg — ABNORMAL LOW (ref 83.0–108.0)

## 2016-03-27 LAB — COMPREHENSIVE METABOLIC PANEL
ALT: 18 U/L (ref 17–63)
AST: 19 U/L (ref 15–41)
Albumin: 3.6 g/dL (ref 3.5–5.0)
Alkaline Phosphatase: 66 U/L (ref 38–126)
Anion gap: 6 (ref 5–15)
BUN: 26 mg/dL — ABNORMAL HIGH (ref 6–20)
CO2: 22 mmol/L (ref 22–32)
Calcium: 8.5 mg/dL — ABNORMAL LOW (ref 8.9–10.3)
Chloride: 105 mmol/L (ref 101–111)
Creatinine, Ser: 1.13 mg/dL (ref 0.61–1.24)
GFR calc Af Amer: >60 mL/min (ref >60)
GFR calc non Af Amer: >60 mL/min (ref >60)
Glucose, Bld: 233 mg/dL — ABNORMAL HIGH (ref 65–99)
Potassium: 4.3 mmol/L (ref 3.5–5.1)
Sodium: 133 mmol/L — ABNORMAL LOW (ref 135–145)
Total Bilirubin: 1.1 mg/dL (ref 0.3–1.2)
Total Protein: 7 g/dL (ref 6.5–8.1)

## 2016-03-27 LAB — GLUCOSE, CAPILLARY
GLUCOSE-CAPILLARY: 179 mg/dL — AB (ref 65–99)
GLUCOSE-CAPILLARY: 274 mg/dL — AB (ref 65–99)
GLUCOSE-CAPILLARY: 208 mg/dL — AB (ref 65–99)
Glucose-Capillary: 188 mg/dL — ABNORMAL HIGH (ref 65–99)

## 2016-03-27 LAB — CBC
HCT: 31.4 % — ABNORMAL LOW (ref 39.0–52.0)
Hemoglobin: 10.1 g/dL — ABNORMAL LOW (ref 13.0–17.0)
MCH: 26.4 pg (ref 26.0–34.0)
MCHC: 32.2 g/dL (ref 30.0–36.0)
MCV: 82.2 fL (ref 78.0–100.0)
Platelets: 69 10*3/uL — ABNORMAL LOW (ref 150–400)
RBC: 3.82 MIL/uL — ABNORMAL LOW (ref 4.22–5.81)
RDW: 18.6 % — ABNORMAL HIGH (ref 11.5–15.5)
WBC: 5.4 10*3/uL (ref 4.0–10.5)

## 2016-03-27 LAB — SURGICAL PCR SCREEN
MRSA, PCR: NEGATIVE
Staphylococcus aureus: POSITIVE — AB

## 2016-03-27 LAB — APTT: aPTT: 29 seconds (ref 24–36)

## 2016-03-27 LAB — PROTIME-INR
INR: 1.14
Prothrombin Time: 14.7 seconds (ref 11.4–15.2)

## 2016-03-27 LAB — PREALBUMIN: Prealbumin: 22.8 mg/dL (ref 18–38)

## 2016-03-27 LAB — PREPARE RBC (CROSSMATCH)

## 2016-03-27 MED ORDER — CHLORHEXIDINE GLUCONATE 0.12 % MT SOLN
15.0000 mL | Freq: Once | OROMUCOSAL | Status: AC
  Administered 2016-03-28: 15 mL via OROMUCOSAL
  Filled 2016-03-27: qty 15

## 2016-03-27 MED ORDER — BISACODYL 5 MG PO TBEC: 5.0000 mg | DELAYED_RELEASE_TABLET | Freq: Once | ORAL | Status: DC

## 2016-03-27 MED ORDER — PLASMA-LYTE 148 IV SOLN
INTRAVENOUS | Status: AC
  Administered 2016-03-28: 400 mL
  Filled 2016-03-27: qty 2.5

## 2016-03-27 MED ORDER — CHLORHEXIDINE GLUCONATE 4 % EX LIQD
60.0000 mL | Freq: Once | CUTANEOUS | Status: AC
  Administered 2016-03-28: 4 via TOPICAL
  Filled 2016-03-27: qty 60

## 2016-03-27 MED ORDER — ALBUTEROL SULFATE (2.5 MG/3ML) 0.083% IN NEBU
2.5000 mg | INHALATION_SOLUTION | Freq: Once | RESPIRATORY_TRACT | Status: AC
  Administered 2016-03-27: 2.5 mg via RESPIRATORY_TRACT

## 2016-03-27 MED ORDER — EPINEPHRINE PF 1 MG/ML IJ SOLN
0.0000 ug/min | INTRAMUSCULAR | Status: DC
  Administered 2016-03-28: 20 ug/min via INTRAVENOUS
  Filled 2016-03-27: qty 4

## 2016-03-27 MED ORDER — TRANEXAMIC ACID (OHS) PUMP PRIME SOLUTION
2.0000 mg/kg | INTRAVENOUS | Status: DC
  Filled 2016-03-27: qty 1.31

## 2016-03-27 MED ORDER — SODIUM CHLORIDE 0.9 % IV SOLN
INTRAVENOUS | Status: AC
  Administered 2016-03-28: 2.7 [IU]/h via INTRAVENOUS
  Filled 2016-03-27: qty 2.5

## 2016-03-27 MED ORDER — MAGNESIUM SULFATE 50 % IJ SOLN
40.0000 meq | INTRAMUSCULAR | Status: DC
  Filled 2016-03-27: qty 10

## 2016-03-27 MED ORDER — DEXMEDETOMIDINE HCL IN NACL 400 MCG/100ML IV SOLN
0.1000 ug/kg/h | INTRAVENOUS | Status: AC
  Administered 2016-03-28: .5 ug/kg/h via INTRAVENOUS
  Filled 2016-03-27: qty 100

## 2016-03-27 MED ORDER — DIAZEPAM 5 MG PO TABS
5.0000 mg | ORAL_TABLET | Freq: Once | ORAL | Status: AC
  Administered 2016-03-28: 5 mg via ORAL
  Filled 2016-03-27: qty 1

## 2016-03-27 MED ORDER — DEXTROSE 5 % IV SOLN
750.0000 mg | INTRAVENOUS | Status: DC
  Filled 2016-03-27: qty 750

## 2016-03-27 MED ORDER — ALPRAZOLAM 0.25 MG PO TABS: 0.2500 mg | ORAL_TABLET | ORAL | Status: DC | PRN

## 2016-03-27 MED ORDER — TRANEXAMIC ACID (OHS) BOLUS VIA INFUSION
15.0000 mg/kg | INTRAVENOUS | Status: AC
  Administered 2016-03-28: 982.5 mg via INTRAVENOUS
  Filled 2016-03-27: qty 983

## 2016-03-27 MED ORDER — TEMAZEPAM 15 MG PO CAPS: 15.0000 mg | ORAL_CAPSULE | Freq: Once | ORAL | Status: DC | PRN

## 2016-03-27 MED ORDER — VANCOMYCIN HCL 10 G IV SOLR
1250.0000 mg | INTRAVENOUS | Status: AC
  Administered 2016-03-28: 1250 mg via INTRAVENOUS
  Filled 2016-03-27: qty 1250

## 2016-03-27 MED ORDER — SODIUM CHLORIDE 0.9 % IV SOLN
INTRAVENOUS | Status: DC
  Filled 2016-03-27: qty 30

## 2016-03-27 MED ORDER — PHENYLEPHRINE HCL 10 MG/ML IJ SOLN
30.0000 ug/min | INTRAMUSCULAR | Status: AC
  Administered 2016-03-28: 20 ug/min via INTRAVENOUS
  Filled 2016-03-27: qty 2

## 2016-03-27 MED ORDER — TRANEXAMIC ACID 1000 MG/10ML IV SOLN
1.5000 mg/kg/h | INTRAVENOUS | Status: AC
  Administered 2016-03-28: 1.5 mg/kg/h via INTRAVENOUS
  Filled 2016-03-27: qty 25

## 2016-03-27 MED ORDER — NITROGLYCERIN IN D5W 200-5 MCG/ML-% IV SOLN
2.0000 ug/min | INTRAVENOUS | Status: DC
  Filled 2016-03-27: qty 250

## 2016-03-27 MED ORDER — DOPAMINE-DEXTROSE 3.2-5 MG/ML-% IV SOLN
0.0000 ug/kg/min | INTRAVENOUS | Status: DC
  Filled 2016-03-27: qty 250

## 2016-03-27 MED ORDER — METOPROLOL TARTRATE 12.5 MG HALF TABLET
12.5000 mg | ORAL_TABLET | Freq: Once | ORAL | Status: AC
  Administered 2016-03-28: 12.5 mg via ORAL
  Filled 2016-03-27: qty 1

## 2016-03-27 MED ORDER — DEXTROSE 5 % IV SOLN
1.5000 g | INTRAVENOUS | Status: AC
  Administered 2016-03-28: .75 g via INTRAVENOUS
  Administered 2016-03-28: 1.5 g via INTRAVENOUS
  Filled 2016-03-27: qty 1.5

## 2016-03-27 MED ORDER — POTASSIUM CHLORIDE 2 MEQ/ML IV SOLN
80.0000 meq | INTRAVENOUS | Status: DC
  Filled 2016-03-27: qty 40

## 2016-03-27 MED ORDER — CHLORHEXIDINE GLUCONATE 4 % EX LIQD
60.0000 mL | Freq: Once | CUTANEOUS | Status: AC
  Administered 2016-03-27: 4 via TOPICAL
  Filled 2016-03-27: qty 60

## 2016-03-27 MED FILL — Heparin Sodium (Porcine) Inj 1000 Unit/ML: INTRAMUSCULAR | Qty: 10 | Status: AC

## 2016-03-27 MED FILL — Verapamil HCl IV Soln 2.5 MG/ML: INTRAVENOUS | Qty: 2 | Status: AC

## 2016-03-27 NOTE — Progress Notes (Signed)
Pre-op Cardiac Surgery  Carotid Findings:  Right  - 1% to 39% ICA stenosis Vertebral artery flow is antegrade. Left - 60% to 79% ICA stenosis. Vertebral artery flow is antegrade  Upper Extremity Right Left  Brachial Pressures 143 Triphasic 143 Triphasic  Radial Waveforms Triphasic Triphasic  Ulnar Waveforms Triphasic Monophasic  Palmar Arch (Allen's Test) Normal Abnormal   Findings:  Right - Doppler waveforms remained normal with both radial and ulnar compressions. Left - Doppler waveforms obliterate with radial compression and remained normal with ulnar compression.    Lower  Extremity Right Left  Dorsalis Pedis 202 Biphasic >255 Triphasic  Posterior Tibial 146 Biphasic 113 Biphasic  Ankle/Brachial Indices 1.4 N/A    Findings:  Right ABI indicate normal arterial flow with a possible increase due to early calcification. Left ABI could not be obtained due to non compressible vessel consistent with possible calcification.   [facility], RVS 03/27/2016 4:12 pm

## 2016-03-27 NOTE — Progress Notes (Signed)
CARDIAC REHAB PHASE I   PRE:  Rate/Rhythm: 75 SR  BP:  Sitting: 124/58        SaO2: 97 RA  MODE:  Ambulation: 450 ft   POST:  Rate/Rhythm: 81 SR  BP:  Sitting: 102/49         SaO2: 99 RA  Pt ambulated 450 on RA, independent, steady gait, tolerated fairly well. Pt c/o mild DOE and his legs feeling tired, denies chest pain, declined rest stop. Cardiac surgery pre-op education completed. Reviewed IS, sternal precautions, activity progression, cardiac surgery booklet and cardiac surgery guidelines. Left instructions to view cardiac surgery videos. Pt verbalized understanding. Pt to bed per pt request after walk, call bell within reach. Will follow post-op.   [place], RN, BSN 03/27/2016 11:53 AM

## 2016-03-27 NOTE — Progress Notes (Signed)
PROGRESS NOTE        PATIENT DETAILS Name: Douglas Mccoy Age: 71 y.o. Sex: male Date of Birth: 03-15-1945 Admit Date: 04/09/2016 Admitting Physician Waldemar Dickens, MD PCP:[facility]  Brief Narrative: Patient is a 71 y.o. 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  Subjective: No chest pain or shortness of breath.   Assessment/Plan: Unstable angina: Known history of CAD, recent echo showed normal LV systolic function. Evaluated by cardiology, underwent nuclear stress test which was positive for ischemia . Underwent initial HC on 3/12 with findings of multivessel disease, CTVS planning on redo CABG-pre CABG work up underway  Recurrent syncope: Suspect syncope likely from autonomic dysfunction-cardiology planning on loop recorder/event monitor if not a candidate for CABG.  History of hypertension: Blood pressure currently controlled, currently not on any antihypertensive regimen. Follow closely in the outpatient setting  Insulin-dependent type 2 diabetes: A1c is 9.1, CBGs better with Lantus 20 units, continue SSI and follow   History of CAD status post CABG: Continue aspirin, will need to follow platelet count closely  History of COPD/asthma: Lungs are clear-no evidence of exacerbation, continue usual bronchodilator regimen  Anemia/mild thrombocytopenia: Probably due to underlying multiple myeloma. Cardiology has consulted hematology over the phone on 3/9. Continue Revlimid.  History of seizures: Continue Keppra. Patient no longer drives-and he is aware that he is not to drive in the future, without clearance from his primary neurologist.  DVT Prophylaxis:  SCD's  Code Status: Full code  Family Communication: None at bedside  Disposition  Plan: Remain inpatient  Antimicrobial agents: Anti-infectives    Start     Dose/Rate Route Frequency Ordered Stop   03/22/16 0000  acyclovir (ZOVIRAX) 200 MG capsule     200 mg Oral 2 times daily 03/22/16 1324     04/09/2016 2200  acyclovir (ZOVIRAX) 200 MG capsule 200 mg     200 mg Oral 2 times daily 03/17/2016 1531        Procedures: None  CONSULTS:  cardiology  Time spent: 25 minutes-Greater than 50% of this time was spent in counseling, explanation of diagnosis, planning of further management, and coordination of care.  MEDICATIONS: Scheduled Meds: . acyclovir  200 mg Oral BID  . aspirin EC  81 mg Oral Daily  . atorvastatin  80 mg Oral q1800  . bisacodyl  5 mg Oral Once  . carvedilol  12.5 mg Oral BID WC  . chlorhexidine  60 mL Topical Once   And  . [START ON 03/14/2016] chlorhexidine  60 mL Topical Once  . [START ON 03/16/2016] chlorhexidine  15 mL Mouth/Throat Once  . [START ON 03/27/2016] diazepam  5 mg Oral Once  . gabapentin  600 mg Oral BID  . insulin aspart  0-5 Units Subcutaneous QHS  . insulin aspart  0-9 Units Subcutaneous TID WC  . insulin glargine  20 Units Subcutaneous Daily  . levETIRAcetam  500 mg Oral Daily  . levothyroxine  50 mcg Oral QAC breakfast  . magnesium oxide  400 mg Oral BID  . [START ON 03/22/2016] metoprolol tartrate  12.5 mg Oral Once  . pneumococcal 23 valent vaccine  0.5 mL Intramuscular Tomorrow-1000  . sertraline  50 mg  Oral Daily  . sodium chloride flush  3 mL Intravenous Q12H  . sodium chloride flush  3 mL Intravenous Q12H   Continuous Infusions: . sodium chloride Stopped (03/26/16 1018)   PRN Meds:.sodium chloride, acetaminophen **OR** acetaminophen, acetaminophen, albuterol, ALPRAZolam, calcium carbonate, diazepam, fluticasone, loperamide, ondansetron (ZOFRAN) IV, ondansetron **OR** [DISCONTINUED] ondansetron (ZOFRAN) IV, sodium chloride flush, temazepam   PHYSICAL EXAM: Vital signs: Vitals:   03/27/16 0203 03/27/16 1056  03/27/16 1315 03/27/16 1353  BP: (!) 126/53 139/70 (!) 121/52 (!) 125/52  Pulse: 64 63 60 62  Resp: 18  18 18   Temp: 98.6 F (37 C)  97.9 F (36.6 C) 98.1 F (36.7 C)  TempSrc: Oral  Oral Oral  SpO2: 99%  98% 99%  Weight: 65.5 kg (144 lb 8 oz)     Height:       Filed Weights   04/06/2016 0600 03/26/16 0555 03/27/16 0203  Weight: 67 kg (147 lb 12.8 oz) 66.2 kg (145 lb 14.4 oz) 65.5 kg (144 lb 8 oz)   Body mass index is 24.05 kg/m.   General appearance :Awake, alert, not in any distress. Speech Clear. Eyes:, pupils equally reactive to light and accomodation,no scleral icterus. HEENT: Atraumatic and Normocephalic Neck: supple, no JVD. No cervical lymphadenopathy. Resp:Good air entry bilaterally, no added sounds  CVS: S1 S2 regular, no murmurs.  GI: Bowel sounds present, Non tender and not distended with no gaurding, rigidity or rebound. Extremities: B/L Lower Ext shows no edema, both legs are warm to touch Neurology:  speech clear,Non focal, sensation is grossly intact. Psychiatric: Normal judgment and insight. Alert and oriented x 3. Normal mood. Musculoskeletal:No digital cyanosis Skin:No Rash, warm and dry Wounds:N/A  I have personally reviewed following labs and imaging studies  LABORATORY DATA: CBC:  Recent Labs Lab 03/16/2016 1436 03/22/16 0314 03/24/16 0439 03/31/2016 0201 03/26/16 0234 03/27/16 0153  WBC 4.6 3.1* 3.9* 4.9 6.2 5.4  NEUTROABS 3.6  --   --   --   --   --   HGB 9.6* 9.5* 10.0* 9.5* 10.1* 10.1*  HCT 29.4* 28.0* 31.2* 29.9* 30.5* 31.4*  MCV 81.9 81.2 82.5 82.4 82.9 82.2  PLT 65* 67* 54* 56* 73* 69*    Basic Metabolic Panel:  Recent Labs Lab 03/22/16 0314 03/24/16 0439 04/04/2016 0201 03/26/16 0234 03/27/16 0153  NA 134* 135 135 134* 133*  K 4.8 4.3 4.3 5.0 4.3  CL 104 104 102 105 105  CO2 20* 23 26 19* 22  GLUCOSE 323* 231* 187* 234* 233*  BUN 24* 32* 29* 23* 26*  CREATININE 1.34* 1.25* 1.23 0.96 1.13  CALCIUM 8.7* 8.7* 8.7* 8.3* 8.5*     GFR: Estimated Creatinine Clearance: 51.4 mL/min (by C-G formula based on SCr of 1.13 mg/dL).  Liver Function Tests:  Recent Labs Lab 03/27/16 0153  AST 19  ALT 18  ALKPHOS 66  BILITOT 1.1  PROT 7.0  ALBUMIN 3.6   No results for input(s): LIPASE, AMYLASE in the last 168 hours. No results for input(s): AMMONIA in the last 168 hours.  Coagulation Profile:  Recent Labs Lab 03/27/16 0153  INR 1.14    Cardiac Enzymes: No results for input(s): CKTOTAL, CKMB, CKMBINDEX, TROPONINI in the last 168 hours.  BNP (last 3 results) No results for input(s): PROBNP in the last 8760 hours.  HbA1C: No results for input(s): HGBA1C in the last 72 hours.  CBG:  Recent Labs Lab 03/26/16 1108 03/26/16 1638 03/26/16 2034 03/27/16 0605 03/27/16 1125  GLUCAP 251*  204* 245* 188* 179*    Lipid Profile:  Recent Labs  04/05/2016 0952  CHOL 68  HDL 31*  LDLCALC 23  TRIG 69  CHOLHDL 2.2    Thyroid Function Tests: No results for input(s): TSH, T4TOTAL, FREET4, T3FREE, THYROIDAB in the last 72 hours.  Anemia Panel: No results for input(s): VITAMINB12, FOLATE, FERRITIN, TIBC, IRON, RETICCTPCT in the last 72 hours.  Urine analysis:    Component Value Date/Time   COLORURINE STRAW (A) 03/14/2016 1427   APPEARANCEUR CLEAR 03/14/2016 1427   LABSPEC 1.014 03/14/2016 1427   PHURINE 7.0 03/14/2016 1427   GLUCOSEU >=500 (A) 03/14/2016 1427   HGBUR NEGATIVE 03/14/2016 1427   BILIRUBINUR NEGATIVE 03/14/2016 1427   KETONESUR NEGATIVE 03/14/2016 1427   PROTEINUR NEGATIVE 03/14/2016 1427   NITRITE NEGATIVE 03/14/2016 1427   LEUKOCYTESUR NEGATIVE 03/14/2016 1427    Sepsis Labs: Lactic Acid, Venous    Component Value Date/Time   LATICACIDVEN 1.85 01/10/2016 1129    MICROBIOLOGY: Recent Results (from the past 240 hour(s))  Surgical pcr screen     Status: Abnormal   Collection Time: 03/27/16 11:03 AM  Result Value Ref Range Status   MRSA, PCR NEGATIVE NEGATIVE Final    Staphylococcus aureus POSITIVE (A) NEGATIVE Final    Comment:        The Xpert SA Assay (FDA approved for NASAL specimens in patients over 21 years of age), is one component of a comprehensive surveillance program.  Test performance has been validated by [facility] for patients greater than or equal to 59 year old. It is not intended to diagnose infection nor to guide or monitor treatment.     RADIOLOGY STUDIES/RESULTS: X-ray Chest Pa And Lateral  Result Date: 03/19/2016 CLINICAL DATA:  Respiratory crackles, pneumonia 2 weeks ago. Hypotensive. History of COPD, CABG, hypertension. EXAM: CHEST  2 VIEW COMPARISON:  Chest radiograph March 14, 2016 FINDINGS: Cardiac silhouette is upper limits normal size, calcified tortuous aorta. Status post median sternotomy for CABG with fractured midsternal heroin. Chronically elevated RIGHT hemidiaphragm. No pleural effusion or focal consolidation. No pneumothorax. Soft tissue planes included osseous structures are nonsuspicious. Single level mid thoracic vertebral body cement augmentation. Mild old approximate T11 compression fracture. Surgical clips in the included right abdomen compatible with cholecystectomy. IMPRESSION: Stable examination: Borderline cardiomegaly, no acute pulmonary process. Electronically Signed   By: Elon Alas M.D.   On: 03/19/2016 19:20   Ct Chest W Contrast  Result Date: 03/26/2016 CLINICAL DATA:  Shortness of breath with exertion for 6-8 months. Preop CABG EXAM: CT CHEST WITH CONTRAST TECHNIQUE: Multidetector CT imaging of the chest was performed during intravenous contrast administration. CONTRAST:  44m ISOVUE-300 IOPAMIDOL (ISOVUE-300) INJECTION 61% COMPARISON:  12/08/2015 FINDINGS: Cardiovascular: Prior CABG. Densely calcified native coronary arteries, aortic arch and descending thoracic aorta. 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  Mediastinum/Nodes: No mediastinal, hilar, or axillary adenopathy. Lungs/Pleura: Mild emphysema in the upper lobes. No confluent airspace opacity. No pleural effusion. Upper Abdomen: Imaging into the upper abdomen demonstrates splenomegaly, partially imaged which appears stable since prior chest CT. Musculoskeletal: Chest wall soft tissues are unremarkable. No acute bony abnormality. Prior vertebroplasty in the mid to lower thoracic spine at a level with mild to moderate compression fracture. This is stable. IMPRESSION: Diffuse aortic atherosclerosis. 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 This could be followed with repeat CT in 1 year. Coronary artery disease, prior CABG. Mild emphysema. Splenomegaly,  partially imaged on this chest CT. This is likely stable compared to prior chest CT. If Electronically Signed   By: Rolm Baptise M.D.   On: 03/26/2016 12:45   Nm Myocar Multi W/spect W/wall Motion / Ef  Result Date: 03/22/2016 CLINICAL DATA:  71 year old male with abnormal EKG EXAM: MYOCARDIAL IMAGING WITH SPECT (REST AND PHARMACOLOGIC-STRESS) GATED LEFT VENTRICULAR WALL MOTION STUDY LEFT VENTRICULAR EJECTION FRACTION TECHNIQUE: Standard myocardial SPECT imaging was performed after resting intravenous injection of 10 mCi Tc-5mtetrofosmin. Subsequently, intravenous infusion of Lexiscan was performed under the supervision of the Cardiology staff. At peak effect of the drug, 30 mCi Tc-982metrofosmin was injected intravenously and standard myocardial SPECT imaging was performed. Quantitative gated imaging was also performed to evaluate left ventricular wall motion, and estimate left ventricular ejection fraction. COMPARISON:  Prior chest x-ray 03/19/2016 FINDINGS: Perfusion: Moderate region of the decreased radiotracer activity involving the mid and apical inferior and inferolateral wall consistent with reversible ischemia. Wall Motion: Normal left ventricular wall motion.  No left ventricular dilation. Left Ventricular Ejection Fraction: 53 % End diastolic volume 96 ml End systolic volume 45 ml IMPRESSION: 1. Positive for a moderate region of reversible ischemia in the inferior and inferolateral walls of the mid and apical left ventricle. 2. Normal left ventricular wall motion. 3. Left ventricular ejection fraction 53% 4. Non invasive risk stratification*: Intermediate *2012 Appropriate Use Criteria for Coronary Revascularization Focused Update: J Am Coll Cardiol. 208478;41(2):[email or web address] Electronically Signed   By: HeJacqulynn Cadet.D.   On: 03/22/2016 14:58   Dg Chest Port 1 View  Result Date: 03/14/2016 CLINICAL DATA:  Sepsis. EXAM: PORTABLE CHEST 1 VIEW COMPARISON:  Radiographs of January 11, 2016. FINDINGS: The heart size and mediastinal contours are within normal limits. Status post coronary bypass graft. Atherosclerosis of thoracic aorta is noted. No pneumothorax or pleural effusion is noted. Elevated right hemidiaphragm is noted. Both lungs are clear. The visualized skeletal structures are unremarkable. IMPRESSION: Aortic atherosclerosis.  No acute cardiopulmonary abnormality seen. Electronically Signed   By: JaMarijo ConceptionM.D.   On: 03/14/2016 14:00     LOS: 5 days   GHOren BinetMD  [facility] Pager:[phone] 7PM-7AM, please contact night-coverage [email or web address] Password [email or web address] 03/27/2016, 2:50 PM

## 2016-03-27 NOTE — Care Management Note (Signed)
Case Management Note Previous CM note initiated by Erenest Rasher, RN 03/20/2016, 10:20 AM   Patient Details  Name: Douglas Mccoy MRN: [id number] Date of Birth: February 10, 1945  Subjective/Objective:    syncope                Action/Plan: Discharge Planning: NCM spoke to pt and states he goes to [place]. He has RW and cane at home. He currently lives in the [place] housing. Will need taxi voucher at [place]. Will consult with CSW for transportation. [facility]. Pt does not have a neb machine.   PCP [facility]  Expected Discharge Date:                  Expected Discharge Plan:  Home/Self Care  In-House Referral:     Discharge planning Services  CM Consult  Post Acute Care Choice:    Choice offered to:     DME Arranged:    DME Agency:     HH Arranged:    HH Agency:     Status of Service:  In process, will continue to follow  If discussed at Long Length of Stay Meetings, dates discussed:  3/15  Discharge Disposition:   Additional Comments:  03/27/16- 1030- Nerine Pulse RN, CM- pt with positive stress test s/p cath with MVD- needs redo CABG- possible OR on 3/15 or 3/16-  [facility]ul[facility] Pont- [phone]- pt is homeless and was staying at the [place] [facility]- also has sisters who live in [place] - CM will follow post up for d/c needs.   Dawayne Patricia, RN 03/27/2016, 10:33 AM [phone]

## 2016-03-27 NOTE — Progress Notes (Signed)
Right Lower Extremity Vein Map    Right Great Saphenous Vein Greater saphenous vein appears to have been harvested. Unable to visualize the vein through the leg distally. Unable to visualize only a minute section of the lesser saphenous which was not acceptable.     Left Lower Extremity Vein Map    Left Great Saphenous Vein   Segment Diameter Comment  1. Origin 70mm   2. High Thigh 32mm   3. Mid Thigh 53mm   4. Low Thigh 37mm   5. At Knee 67mm   6. High Calf 79mm   7. Low Calf 1.71mm Thrombosed  8. Ankle mm Not visualized    Measurement is marginal with what appears to have a mild wall thickening    Judy Pollman, RVS 03/27/2016, 1:17 PM

## 2016-03-27 NOTE — Progress Notes (Signed)
Progress Note  Patient Name: Douglas Mccoy Date of Encounter: 03/27/2016  Primary Cardiologist: [facility]  Subjective   Patient is feeling well; denies chest pain, SOB, and palpitations. He has returned from PFTs and vein mapping. No questions about surgery tomorrow.  Inpatient Medications    Scheduled Meds: . acyclovir  200 mg Oral BID  . aspirin EC  81 mg Oral Daily  . atorvastatin  80 mg Oral q1800  . carvedilol  12.5 mg Oral BID WC  . gabapentin  600 mg Oral BID  . insulin aspart  0-5 Units Subcutaneous QHS  . insulin aspart  0-9 Units Subcutaneous TID WC  . insulin glargine  20 Units Subcutaneous Daily  . levETIRAcetam  500 mg Oral Daily  . levothyroxine  50 mcg Oral QAC breakfast  . magnesium oxide  400 mg Oral BID  . pneumococcal 23 valent vaccine  0.5 mL Intramuscular Tomorrow-1000  . sertraline  50 mg Oral Daily  . sodium chloride flush  3 mL Intravenous Q12H  . sodium chloride flush  3 mL Intravenous Q12H   Continuous Infusions: . sodium chloride Stopped (03/26/16 1018)   PRN Meds: sodium chloride, acetaminophen **OR** acetaminophen, acetaminophen, albuterol, calcium carbonate, diazepam, fluticasone, loperamide, ondansetron (ZOFRAN) IV, ondansetron **OR** [DISCONTINUED] ondansetron (ZOFRAN) IV, sodium chloride flush   Vital Signs    Vitals:   03/26/16 1016 03/26/16 1432 03/26/16 1941 03/27/16 0203  BP: (!) 146/54 132/62 (!) 143/54 (!) 126/53  Pulse: 61 63 64 64  Resp:  _0 Temp:   98.5 F (36.9 C) 98.6 F (37 C)  TempSrc:   Oral Oral  SpO2:  99% 98% 99%  Weight:    144 lb 8 oz (65.5 kg)  Height:        Intake/Output Summary (Last 24 hours) at 03/27/16 0919 Last data filed at 03/26/16 2300  Gross per 24 hour  Intake              150 ml  Output                0 ml  Net              150 ml   Filed Weights   03/18/2016 0600 03/26/16 0555 03/27/16 0203  Weight: 147 lb 12.8 oz (67 kg) 145 lb 14.4 oz (66.2 kg) 144 lb 8 oz (65.5 kg)     Physical  Exam   General: Well developed, well nourished, male appearing in no acute distress. Head: Normocephalic, atraumatic.  Neck: Supple without bruits, JVD. Lungs:  Resp regular and unlabored, CTA. Heart: RRR, S1, S2, no murmur; no rub. Abdomen: Soft, non-tender, non-distended with normoactive bowel sounds. No hepatomegaly. No rebound/guarding. No obvious abdominal masses. Extremities: No clubbing, cyanosis, No edema. Distal pedal pulses are 1+ bilaterally. Neuro: Alert and oriented X 3. Moves all extremities spontaneously. Psych: Normal affect.  Labs    Chemistry Recent Labs Lab 04/04/2016 0201 03/26/16 0234 03/27/16 0153  NA 135 134* 133*  K 4.3 5.0 4.3  CL 102 105 105  CO2 26 19* 22  GLUCOSE 187* 234* 233*  BUN 29* 23* 26*  CREATININE 1.23 0.96 1.13  CALCIUM 8.7* 8.3* 8.5*  PROT  --   --  7.0  ALBUMIN  --   --  3.6  AST  --   --  19  ALT  --   --  18  ALKPHOS  --   --  66  BILITOT  --   --  1.1  GFRNONAA 57* >60 >60  GFRAA >60 >60 >60  ANIONGAP _0 Hematology Recent Labs Lab 03/31/2016 0201 03/26/16 0234 03/27/16 0153  WBC 4.9 6.2 5.4  RBC 3.63* 3.68* 3.82*  HGB 9.5* 10.1* 10.1*  HCT 29.9* 30.5* 31.4*  MCV 82.4 82.9 82.2  MCH 26.2 27.4 26.4  MCHC 31.8 33.1 32.2  RDW 17.9* 18.3* 18.6*  PLT 56* 73* 69*    Cardiac EnzymesNo results for input(s): TROPONINI in the last 168 hours.  Recent Labs Lab 04/09/2016 1502  TROPIPOC 0.08     BNPNo results for input(s): BNP, PROBNP in the last 168 hours.   DDimer No results for input(s): DDIMER in the last 168 hours.   Radiology    Ct Chest W Contrast  Result Date: 03/26/2016 CLINICAL DATA:  Shortness of breath with exertion for 6-8 months. Preop CABG EXAM: CT CHEST WITH CONTRAST TECHNIQUE: Multidetector CT imaging of the chest was performed during intravenous contrast administration. CONTRAST:  91m ISOVUE-300 IOPAMIDOL (ISOVUE-300) INJECTION 61% COMPARISON:  12/08/2015 FINDINGS: Cardiovascular: Prior CABG.  Densely calcified native coronary arteries, aortic arch and descending thoracic aorta. 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 Mediastinum/Nodes: No mediastinal, hilar, or axillary adenopathy. Lungs/Pleura: Mild emphysema in the upper lobes. No confluent airspace opacity. No pleural effusion. Upper Abdomen: Imaging into the upper abdomen demonstrates splenomegaly, partially imaged which appears stable since prior chest CT. Musculoskeletal: Chest wall soft tissues are unremarkable. No acute bony abnormality. Prior vertebroplasty in the mid to lower thoracic spine at a level with mild to moderate compression fracture. This is stable. IMPRESSION: Diffuse aortic atherosclerosis. 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 This could be followed with repeat CT in 1 year. Coronary artery disease, prior CABG. Mild emphysema. Splenomegaly, partially imaged on this chest CT. This is likely stable compared to prior chest CT. If Electronically Signed   By: KRolm BaptiseM.D.   On: 03/26/2016 12:45     Telemetry    NSR in the 60s - Personally Reviewed  ECG    No new tracings - Personally Reviewed   Cardiac Studies   03/26/16 Echocardiogram:  Study Conclusions - Left ventricle: The cavity size was normal. Wall thickness was   increased in a pattern of mild LVH. Systolic function was normal.   The estimated ejection fraction was in the range of 50% to 55%.   Wall motion was normal; there were no regional wall motion   abnormalities. Doppler parameters are consistent with abnormal   left ventricular relaxation (grade 1 diastolic dysfunction).   Doppler parameters are consistent with high ventricular filling   pressure. - Aortic valve: Valve mobility was restricted. There was mild   regurgitation. - Aortic root: The aortic root was mildly dilated. - Mitral valve: Calcified annulus. There was mild  regurgitation. - Left atrium: The atrium was mildly dilated. - Atrial septum: There was an atrial septal aneurysm.  Impressions: - Low normal LV systolic function; mild LVH; grade 1 diastolic   dysfunction; elevated LV filling pressure; calcified aortic valve   with mild AI and no AS by doppler; mild MR; mild LAE.   01/11/2016 ECHO: - Left ventricle: The cavity size was normal. Wall thickness wasincreased in a pattern of mild LVH. The estimated ejectionfraction was 55%. Wall motion was normal; there were no regionalwall motion abnormalities. Doppler parameters are consistent withabnormal left ventricular relaxation (grade 1 diastolicdysfunction). -  Aortic valve: Trileaflet; moderately calcified leaflets.Sclerosis without stenosis. There was trivial regurgitation. Meangradient (S): 8 mm Hg. - Mitral valve: Moderately calcified annulus. There was mildregurgitation. - Left atrium: The atrium was mildly dilated. - Right ventricle: The cavity size was normal. Systolic functionwas normal. - Pulmonary arteries: PA peak pressure: 19 mm Hg (S). - Inferior vena cava: The vessel was normal in size. Therespirophasic diameter changes were in the normal range (>= 50%),consistent with normal central venous pressure.  Impressions:  - Normal LV size with mild LV hypertrophy. EF 55%. Normal RV sizeand systolic function. Aortic sclerosis without significantstenosis. Mild mitral regurgitation.   NUCLEAR STUDY 03/22/2016: Perfusion: Moderate region of the decreased radiotracer activity involving the mid and apical inferior and inferolateral wall consistent with reversible ischemia.  Wall Motion: Normal left ventricular wall motion. No left ventricular dilation. Left Ventricular Ejection Fraction: 53 %  IMPRESSION: 1. Positive for a moderate region of reversible ischemia in the inferior and inferolateral walls of the mid and apical left ventricle.   Left Heart Catheterization  03/27/2016:  Ost RCA to Prox RCA lesion, 95 %stenosed.  Prox RCA lesion, 100 %stenosed.  Ost 1st Mrg to 1st Mrg lesion, 70 %stenosed.  Ost Cx lesion, 40 %stenosed.  Ost LAD to Prox LAD lesion, 90 %stenosed.  Mid LAD lesion, 100 %stenosed.  SVG.  Origin lesion, 100 %stenosed.  Origin lesion, 100 %stenosed.  Origin to Prox Graft lesion, 100 %stenosed.  Dist LAD lesion, 20 %stenosed.  Ost LM to LM lesion, 30 %stenosed.  Severe native CAD with 30% distal left main stenosis, 90% proximal LAD stenosis before the first diagonal followed by an aneurysmal segment proximal to a large septal perforating artery and beyond this septal branch the LAD is totally occluded; 30% ostial circumflex stenosis with 70% diffuse proximal stenosis in the OM1 branch; 95% ostial and 100% mid RCA occlusion.  Patent LIMA which seems to supply a small second diagonal vessel and mid LAD. There is suggestion of mild 20% narrowing beyond the LAD anastomosis.  Occlusion of all 3 grafts, which arise from the aorta of which the specific anastomoses are uncertain. The diagram depicted above is a presumption of the most likely locations where the grafts may have supplied.  Mildly dilated aortic root with mild calcification and slight restriction of motion in the aortic valve.  Patient Profile     71 y.o. male with CAD s/p CABG 2004, exertional angina, preserved LVEF, abnormal perfusion study and recurrent syncope at rest. Important noncardiac issues include multiple myeloma, moderate thrombocytopenia and homeless status.  Assessment & Plan    1. CAD s/p CABG: - lipid panel with cholesterol 68; LDL 23 - myoview with reversible ischemia - heart catheterization with graft occlusions but patent LIMA; no PCI completed - CT surgery consulted; they are planning a Re-Do CABG on 03/17/2016   2. Syncope: pattern is peculiar and most likely due to autonomic neuropathy/hypotension, but cannot exclude arrhythmia. Echo  pending. No arrhythmias observed on telemetry. Would still consider implantable loop recorder for syncopal episodes  3. S/P R CEA   4. CKD stage 2:  - sCr 1.13 (0.96); baseline sCr appears to be 1.2-1.4 - Caution with contrast exposure.  5. MM: S/p BMT, on Revlimid.  6. Tbpenia: And mild anemia, stable.  7. DM  8. Hypothyroidism  9. COPD  10. Homeless, living in [place], hopes to have an apartment in [place] soon.  Signed, Tami Lin Duke , PA-C 9:19 AM 03/27/2016 Pager: [phone]  I have  seen and examined the patient along with Ledora Bottcher , PA-C.  I have reviewed the chart, notes and new data.  I agree with PA's note.  Key new complaints: walked 3 laps around nurse station - had mild dyspnea, no angina or syncope. Key examination changes: no overt HF, no arrhythmia on monitor. Key new findings / data: PFTs show moderate obstructive disease FEV1 about 1.7L (60% pred), not prohibitive for surgery. Venous mapping preliminary report shows no conduits on R and mediocre conduits on L.   PLAN: Difficult situation due to nature of disease and comorbid conditions. Will wait fr Dr. Lucianne Lei Trigt's final recommendations. If deemed not to be a CABG candidate, would implant ILR before DC on medical therapy.  Sanda Klein, MD, [place] [phone] 03/27/2016, 12:52 PM

## 2016-03-28 ENCOUNTER — Inpatient Hospital Stay (HOSPITAL_COMMUNITY): Payer: Medicare Other | Admitting: Anesthesiology

## 2016-03-28 ENCOUNTER — Inpatient Hospital Stay (HOSPITAL_COMMUNITY): Payer: Medicare Other

## 2016-03-28 ENCOUNTER — Inpatient Hospital Stay (HOSPITAL_COMMUNITY): Admission: EM | Disposition: E | Payer: Self-pay | Source: Home / Self Care | Attending: Internal Medicine

## 2016-03-28 DIAGNOSIS — I25708 Atherosclerosis of coronary artery bypass graft(s), unspecified, with other forms of angina pectoris: Secondary | ICD-10-CM

## 2016-03-28 DIAGNOSIS — I25110 Atherosclerotic heart disease of native coronary artery with unstable angina pectoris: Secondary | ICD-10-CM

## 2016-03-28 HISTORY — PX: CORONARY ARTERY BYPASS GRAFT: SHX141

## 2016-03-28 HISTORY — PX: TEE WITHOUT CARDIOVERSION: SHX5443

## 2016-03-28 LAB — POCT I-STAT 3, ART BLOOD GAS (G3+)
ACID-BASE DEFICIT: 27 mmol/L — AB (ref 0.0–2.0)
ACID-BASE EXCESS: 3 mmol/L — AB (ref 0.0–2.0)
ACID-BASE EXCESS: 1 mmol/L (ref 0.0–2.0)
ACID-BASE EXCESS: 4 mmol/L — AB (ref 0.0–2.0)
Acid-Base Excess: 4 mmol/L — ABNORMAL HIGH (ref 0.0–2.0)
Acid-base deficit: 7 mmol/L — ABNORMAL HIGH (ref 0.0–2.0)
BICARBONATE: 27.6 mmol/L (ref 20.0–28.0)
BICARBONATE: 29.9 mmol/L — AB (ref 20.0–28.0)
BICARBONATE: 29.6 mmol/L — AB (ref 20.0–28.0)
Bicarbonate: 8.2 mmol/L — ABNORMAL LOW (ref 20.0–28.0)
Bicarbonate: 23.2 mmol/L (ref 20.0–28.0)
Bicarbonate: 28 mmol/L (ref 20.0–28.0)
O2 SAT: 100 %
O2 SAT: 98 %
O2 SAT: 97 %
O2 SAT: 96 %
O2 Saturation: 98 %
O2 Saturation: 99 %
PCO2 ART: 74.8 mmHg — AB (ref 32.0–48.0)
PCO2 ART: 38.9 mmHg (ref 32.0–48.0)
PCO2 ART: 48 mmHg (ref 32.0–48.0)
PH ART: 7.319 — AB (ref 7.350–7.450)
PH ART: 7.395 (ref 7.350–7.450)
PO2 ART: 141 mmHg — AB (ref 83.0–108.0)
PO2 ART: 90 mmHg (ref 83.0–108.0)
PO2 ART: 127 mmHg — AB (ref 83.0–108.0)
Patient temperature: 36.2
TCO2: 10 mmol/L (ref 0–100)
TCO2: 25 mmol/L (ref 0–100)
TCO2: 29 mmol/L (ref 0–100)
TCO2: 30 mmol/L (ref 0–100)
TCO2: 32 mmol/L (ref 0–100)
TCO2: 31 mmol/L (ref 0–100)
pCO2 arterial: 59.3 mmHg — ABNORMAL HIGH (ref 32.0–48.0)
pCO2 arterial: 53.4 mmHg — ABNORMAL HIGH (ref 32.0–48.0)
pCO2 arterial: 50.4 mmHg — ABNORMAL HIGH (ref 32.0–48.0)
pH, Arterial: 6.749 — CL (ref 7.350–7.450)
pH, Arterial: 7.1 — CL (ref 7.350–7.450)
pH, Arterial: 7.459 — ABNORMAL HIGH (ref 7.350–7.450)
pH, Arterial: 7.375 (ref 7.350–7.450)
pO2, Arterial: 483 mmHg — ABNORMAL HIGH (ref 83.0–108.0)
pO2, Arterial: 98 mmHg (ref 83.0–108.0)
pO2, Arterial: 82 mmHg — ABNORMAL LOW (ref 83.0–108.0)

## 2016-03-28 LAB — CBC
HCT: 29.5 % — ABNORMAL LOW (ref 39.0–52.0)
HCT: 29.5 % — ABNORMAL LOW (ref 39.0–52.0)
HCT: 27.8 % — ABNORMAL LOW (ref 39.0–52.0)
HEMOGLOBIN: 10.2 g/dL — AB (ref 13.0–17.0)
Hemoglobin: 9.4 g/dL — ABNORMAL LOW (ref 13.0–17.0)
Hemoglobin: 9.4 g/dL — ABNORMAL LOW (ref 13.0–17.0)
MCH: 26.4 pg (ref 26.0–34.0)
MCH: 28.6 pg (ref 26.0–34.0)
MCH: 28.1 pg (ref 26.0–34.0)
MCHC: 31.9 g/dL (ref 30.0–36.0)
MCHC: 34.6 g/dL (ref 30.0–36.0)
MCHC: 33.8 g/dL (ref 30.0–36.0)
MCV: 82.9 fL (ref 78.0–100.0)
MCV: 82.6 fL (ref 78.0–100.0)
MCV: 83.2 fL (ref 78.0–100.0)
PLATELETS: 99 10*3/uL — AB (ref 150–400)
Platelets: 90 10*3/uL — ABNORMAL LOW (ref 150–400)
Platelets: 93 10*3/uL — ABNORMAL LOW (ref 150–400)
RBC: 3.56 MIL/uL — ABNORMAL LOW (ref 4.22–5.81)
RBC: 3.57 MIL/uL — AB (ref 4.22–5.81)
RBC: 3.34 MIL/uL — ABNORMAL LOW (ref 4.22–5.81)
RDW: 18.6 % — ABNORMAL HIGH (ref 11.5–15.5)
RDW: 16.3 % — ABNORMAL HIGH (ref 11.5–15.5)
RDW: 15.5 % (ref 11.5–15.5)
WBC: 5 10*3/uL (ref 4.0–10.5)
WBC: 13.4 10*3/uL — AB (ref 4.0–10.5)
WBC: 13.2 10*3/uL — ABNORMAL HIGH (ref 4.0–10.5)

## 2016-03-28 LAB — DIC (DISSEMINATED INTRAVASCULAR COAGULATION)PANEL
D-Dimer, Quant: 2.24 ug/mL-FEU — ABNORMAL HIGH (ref 0.00–0.50)
Fibrinogen: 199 mg/dL — ABNORMAL LOW (ref 210–475)
INR: 1.6
Platelets: 118 10*3/uL — ABNORMAL LOW (ref 150–400)
Prothrombin Time: 19.3 seconds — ABNORMAL HIGH (ref 11.4–15.2)
Smear Review: NONE SEEN
aPTT: 93 seconds — ABNORMAL HIGH (ref 24–36)

## 2016-03-28 LAB — POCT I-STAT, CHEM 8
BUN: 27 mg/dL — ABNORMAL HIGH (ref 6–20)
BUN: 24 mg/dL — ABNORMAL HIGH (ref 6–20)
BUN: 27 mg/dL — AB (ref 6–20)
BUN: 17 mg/dL (ref 6–20)
BUN: 20 mg/dL (ref 6–20)
CALCIUM ION: 1.2 mmol/L (ref 1.15–1.40)
CALCIUM ION: 0.98 mmol/L — AB (ref 1.15–1.40)
CALCIUM ION: 0.97 mmol/L — AB (ref 1.15–1.40)
CHLORIDE: 103 mmol/L (ref 101–111)
CREATININE: 0.9 mg/dL (ref 0.61–1.24)
CREATININE: 0.4 mg/dL — AB (ref 0.61–1.24)
CREATININE: 0.8 mg/dL (ref 0.61–1.24)
Calcium, Ion: 0.56 mmol/L — CL (ref 1.15–1.40)
Calcium, Ion: 0.79 mmol/L — CL (ref 1.15–1.40)
Chloride: 103 mmol/L (ref 101–111)
Chloride: 94 mmol/L — ABNORMAL LOW (ref 101–111)
Chloride: 97 mmol/L — ABNORMAL LOW (ref 101–111)
Chloride: 99 mmol/L — ABNORMAL LOW (ref 101–111)
Creatinine, Ser: 0.7 mg/dL (ref 0.61–1.24)
Creatinine, Ser: 1 mg/dL (ref 0.61–1.24)
GLUCOSE: 197 mg/dL — AB (ref 65–99)
Glucose, Bld: 302 mg/dL — ABNORMAL HIGH (ref 65–99)
Glucose, Bld: 231 mg/dL — ABNORMAL HIGH (ref 65–99)
Glucose, Bld: 118 mg/dL — ABNORMAL HIGH (ref 65–99)
Glucose, Bld: 170 mg/dL — ABNORMAL HIGH (ref 65–99)
HCT: 34 % — ABNORMAL LOW (ref 39.0–52.0)
HEMATOCRIT: 22 % — AB (ref 39.0–52.0)
HEMATOCRIT: 21 % — AB (ref 39.0–52.0)
HEMATOCRIT: 27 % — AB (ref 39.0–52.0)
HEMATOCRIT: 22 % — AB (ref 39.0–52.0)
HEMOGLOBIN: 7.5 g/dL — AB (ref 13.0–17.0)
HEMOGLOBIN: 7.1 g/dL — AB (ref 13.0–17.0)
HEMOGLOBIN: 9.2 g/dL — AB (ref 13.0–17.0)
Hemoglobin: 11.6 g/dL — ABNORMAL LOW (ref 13.0–17.0)
Hemoglobin: 7.5 g/dL — ABNORMAL LOW (ref 13.0–17.0)
POTASSIUM: 4.4 mmol/L (ref 3.5–5.1)
Potassium: 4.4 mmol/L (ref 3.5–5.1)
Potassium: 4.3 mmol/L (ref 3.5–5.1)
Potassium: 6.1 mmol/L — ABNORMAL HIGH (ref 3.5–5.1)
Potassium: 4.1 mmol/L (ref 3.5–5.1)
SODIUM: 138 mmol/L (ref 135–145)
SODIUM: 142 mmol/L (ref 135–145)
SODIUM: 145 mmol/L (ref 135–145)
SODIUM: 147 mmol/L — AB (ref 135–145)
Sodium: 139 mmol/L (ref 135–145)
TCO2: 26 mmol/L (ref 0–100)
TCO2: 40 mmol/L (ref 0–100)
TCO2: 25 mmol/L (ref 0–100)
TCO2: 27 mmol/L (ref 0–100)
TCO2: 27 mmol/L (ref 0–100)

## 2016-03-28 LAB — BPAM PLATELET PHERESIS
Blood Product Expiration Date: 201803152359
ISSUE DATE / TIME: 201803141314
Unit Type and Rh: 8400

## 2016-03-28 LAB — URINALYSIS, ROUTINE W REFLEX MICROSCOPIC
Bacteria, UA: NONE SEEN
Bilirubin Urine: NEGATIVE
HGB URINE DIPSTICK: NEGATIVE
Ketones, ur: NEGATIVE mg/dL
Leukocytes, UA: NEGATIVE
NITRITE: NEGATIVE
PH: 5 (ref 5.0–8.0)
Protein, ur: 30 mg/dL — AB
SPECIFIC GRAVITY, URINE: 1.015 (ref 1.005–1.030)

## 2016-03-28 LAB — VAS US DOPPLER PRE CABG
LEFT ECA DIAS: -7 cm/s
LEFT VERTEBRAL DIAS: -10 cm/s
Left CCA dist dias: -10 cm/s
Left CCA dist sys: -106 cm/s
Left CCA prox dias: 13 cm/s
Left CCA prox sys: 80 cm/s
Left ICA dist dias: -13 cm/s
Left ICA dist sys: -128 cm/s
Left ICA prox dias: -42 cm/s
Left ICA prox sys: -262 cm/s
RIGHT ECA DIAS: -1 cm/s
RIGHT VERTEBRAL DIAS: -8 cm/s
Right CCA prox dias: 13 cm/s
Right CCA prox sys: 90 cm/s
Right cca dist sys: -79 cm/s

## 2016-03-28 LAB — APTT: APTT: 116 s — AB (ref 24–36)

## 2016-03-28 LAB — PREPARE PLATELET PHERESIS: Unit division: 0

## 2016-03-28 LAB — HEMOGLOBIN AND HEMATOCRIT, BLOOD
HCT: 41 % (ref 39.0–52.0)
Hemoglobin: 14 g/dL (ref 13.0–17.0)

## 2016-03-28 LAB — PLATELET COUNT
Platelets: 20 10*3/uL — CL (ref 150–400)
Platelets: 28 10*3/uL — CL (ref 150–400)

## 2016-03-28 LAB — BASIC METABOLIC PANEL
Anion gap: 10 (ref 5–15)
BUN: 30 mg/dL — ABNORMAL HIGH (ref 6–20)
CO2: 24 mmol/L (ref 22–32)
Calcium: 8.6 mg/dL — ABNORMAL LOW (ref 8.9–10.3)
Chloride: 101 mmol/L (ref 101–111)
Creatinine, Ser: 1.34 mg/dL — ABNORMAL HIGH (ref 0.61–1.24)
GFR calc Af Amer: 59 mL/min — ABNORMAL LOW (ref >60)
GFR calc non Af Amer: 51 mL/min — ABNORMAL LOW (ref >60)
Glucose, Bld: 252 mg/dL — ABNORMAL HIGH (ref 65–99)
Potassium: 4.4 mmol/L (ref 3.5–5.1)
Sodium: 135 mmol/L (ref 135–145)

## 2016-03-28 LAB — GLUCOSE, CAPILLARY
Glucose-Capillary: 165 mg/dL — ABNORMAL HIGH (ref 65–99)
Glucose-Capillary: 188 mg/dL — ABNORMAL HIGH (ref 65–99)
Glucose-Capillary: 188 mg/dL — ABNORMAL HIGH (ref 65–99)
Glucose-Capillary: 190 mg/dL — ABNORMAL HIGH (ref 65–99)
Glucose-Capillary: 208 mg/dL — ABNORMAL HIGH (ref 65–99)
Glucose-Capillary: 211 mg/dL — ABNORMAL HIGH (ref 65–99)

## 2016-03-28 LAB — HEMOGLOBIN A1C
Hgb A1c MFr Bld: 8.4 % — ABNORMAL HIGH (ref 4.8–5.6)
Mean Plasma Glucose: 194 mg/dL

## 2016-03-28 LAB — POCT I-STAT 4, (NA,K, GLUC, HGB,HCT)
GLUCOSE: 169 mg/dL — AB (ref 65–99)
HEMATOCRIT: 26 % — AB (ref 39.0–52.0)
HEMOGLOBIN: 8.8 g/dL — AB (ref 13.0–17.0)
POTASSIUM: 3.4 mmol/L — AB (ref 3.5–5.1)
SODIUM: 148 mmol/L — AB (ref 135–145)

## 2016-03-28 LAB — PREPARE RBC (CROSSMATCH)

## 2016-03-28 LAB — PROTIME-INR
INR: 4.44
PROTHROMBIN TIME: 43.5 s — AB (ref 11.4–15.2)

## 2016-03-28 LAB — FIBRINOGEN: FIBRINOGEN: 205 mg/dL — AB (ref 210–475)

## 2016-03-28 SURGERY — REDO CORONARY ARTERY BYPASS GRAFTING (CABG)
Anesthesia: General | Site: Chest

## 2016-03-28 MED ORDER — METOPROLOL TARTRATE 25 MG/10 ML ORAL SUSPENSION: 12.5000 mg | Freq: Two times a day (BID) | ORAL | Status: DC

## 2016-03-28 MED ORDER — CEFUROXIME SODIUM 1.5 G IJ SOLR
1.5000 g | Freq: Two times a day (BID) | INTRAMUSCULAR | Status: DC
  Administered 2016-03-29: 1.5 g via INTRAVENOUS
  Filled 2016-03-28: qty 1.5

## 2016-03-28 MED ORDER — LIDOCAINE IN D5W 4-5 MG/ML-% IV SOLN
1.5000 mg/min | INTRAVENOUS | Status: DC
  Administered 2016-03-28: 1.5 mg/min via INTRAVENOUS
  Filled 2016-03-28: qty 500

## 2016-03-28 MED ORDER — SODIUM CHLORIDE 0.9 % IV SOLN
INTRAVENOUS | Status: DC
  Administered 2016-04-01: 20 mL via INTRAVENOUS
  Administered 2016-04-01: 10 mL/h via INTRAVENOUS

## 2016-03-28 MED ORDER — HEPARIN SODIUM (PORCINE) 1000 UNIT/ML IJ SOLN
INTRAMUSCULAR | Status: DC | PRN
  Administered 2016-03-28: 3000 [IU] via INTRAVENOUS
  Administered 2016-03-28: 23000 [IU] via INTRAVENOUS

## 2016-03-28 MED ORDER — NOREPINEPHRINE BITARTRATE 1 MG/ML IV SOLN
0.0000 ug/min | INTRAVENOUS | Status: DC
  Filled 2016-03-28: qty 4

## 2016-03-28 MED ORDER — MIDAZOLAM HCL 2 MG/2ML IJ SOLN
2.0000 mg | INTRAMUSCULAR | Status: DC | PRN
  Administered 2016-03-28 – 2016-03-30 (×14): 2 mg via INTRAVENOUS
  Filled 2016-03-28 (×14): qty 2

## 2016-03-28 MED ORDER — SODIUM CHLORIDE 0.9 % IV SOLN
0.0300 [IU]/min | INTRAVENOUS | Status: AC
  Administered 2016-03-28: 0.03 [IU]/min via INTRAVENOUS
  Filled 2016-03-28: qty 2

## 2016-03-28 MED ORDER — MIDAZOLAM HCL 5 MG/5ML IJ SOLN
INTRAMUSCULAR | Status: DC | PRN
  Administered 2016-03-28 (×2): 1 mg via INTRAVENOUS
  Administered 2016-03-28: 5 mg via INTRAVENOUS
  Administered 2016-03-28: 3 mg via INTRAVENOUS

## 2016-03-28 MED ORDER — HEMOSTATIC AGENTS (NO CHARGE) OPTIME
TOPICAL | Status: DC | PRN
  Administered 2016-03-28: 1 via TOPICAL

## 2016-03-28 MED ORDER — ONDANSETRON HCL 4 MG/2ML IJ SOLN: 4.0000 mg | Freq: Four times a day (QID) | INTRAMUSCULAR | Status: DC | PRN

## 2016-03-28 MED ORDER — PLASMA-LYTE 148 IV SOLN
INTRAVENOUS | Status: AC
  Administered 2016-03-28: 500 mL
  Filled 2016-03-28: qty 2.5

## 2016-03-28 MED ORDER — SODIUM BICARBONATE 8.4 % IV SOLN
INTRAVENOUS | Status: DC | PRN
  Administered 2016-03-28: 50 meq via INTRAVENOUS

## 2016-03-28 MED ORDER — DEXMEDETOMIDINE HCL IN NACL 200 MCG/50ML IV SOLN
0.0000 ug/kg/h | INTRAVENOUS | Status: DC
  Administered 2016-03-28 – 2016-03-29 (×3): 0.7 ug/kg/h via INTRAVENOUS
  Administered 2016-03-29 (×2): 0.5 ug/kg/h via INTRAVENOUS
  Administered 2016-03-30: 0.7 ug/kg/h via INTRAVENOUS
  Administered 2016-03-30: 0.5 ug/kg/h via INTRAVENOUS
  Administered 2016-03-30: 0.7 ug/kg/h via INTRAVENOUS
  Filled 2016-03-28 (×5): qty 50
  Filled 2016-03-28: qty 100
  Filled 2016-03-28: qty 50

## 2016-03-28 MED ORDER — EPINEPHRINE PF 1 MG/10ML IJ SOSY
PREFILLED_SYRINGE | INTRAMUSCULAR | Status: DC | PRN
Start: 2016-03-28 — End: 2016-03-28
  Administered 2016-03-28 (×2): 1 mg via INTRAVENOUS
  Administered 2016-03-28: 0.2 mg via INTRAVENOUS
  Administered 2016-03-28 (×3): 1 mg via INTRAVENOUS
  Administered 2016-03-28: 0.2 mg via INTRAVENOUS
  Administered 2016-03-28: 0.1 mg via INTRAVENOUS
  Administered 2016-03-28: 1 mg via INTRAVENOUS
  Administered 2016-03-28: 0.1 mg via INTRAVENOUS
  Administered 2016-03-28: .05 mg via INTRAVENOUS
  Administered 2016-03-28: 0.1 mg via INTRAVENOUS
  Administered 2016-03-28 (×2): 0.5 mg via INTRAVENOUS
  Administered 2016-03-28: .01 mg via INTRAVENOUS

## 2016-03-28 MED ORDER — PROPOFOL 10 MG/ML IV BOLUS
INTRAVENOUS | Status: AC
  Filled 2016-03-28: qty 20

## 2016-03-28 MED ORDER — SODIUM CHLORIDE 0.9 % IV SOLN
0.0000 [IU]/min | INTRAVENOUS | Status: DC
  Filled 2016-03-28: qty 2

## 2016-03-28 MED ORDER — LACTATED RINGERS IV SOLN
INTRAVENOUS | Status: DC | PRN
  Administered 2016-03-28 (×8): via INTRAVENOUS

## 2016-03-28 MED ORDER — ALBUMIN HUMAN 5 % IV SOLN: 250.0000 mL | INTRAVENOUS | Status: DC | PRN

## 2016-03-28 MED ORDER — ALBUMIN HUMAN 5 % IV SOLN
250.0000 mL | INTRAVENOUS | Status: AC | PRN
Start: 2016-03-28 — End: 2016-03-29
  Administered 2016-03-28: 250 mL via INTRAVENOUS

## 2016-03-28 MED ORDER — MORPHINE SULFATE (PF) 2 MG/ML IV SOLN
2.0000 mg | INTRAVENOUS | Status: DC | PRN
  Administered 2016-03-29 – 2016-04-03 (×6): 2 mg via INTRAVENOUS
  Filled 2016-03-28: qty 1
  Filled 2016-03-28: qty 2
  Filled 2016-03-28 (×3): qty 1

## 2016-03-28 MED ORDER — SODIUM CHLORIDE 0.9 % IV SOLN: Freq: Once | INTRAVENOUS | Status: DC

## 2016-03-28 MED ORDER — SODIUM CHLORIDE 0.9 % IV SOLN
0.0000 ug/min | INTRAVENOUS | Status: DC
  Filled 2016-03-28: qty 4

## 2016-03-28 MED ORDER — TRAMADOL HCL 50 MG PO TABS: 50.0000 mg | ORAL_TABLET | ORAL | Status: DC | PRN

## 2016-03-28 MED ORDER — SODIUM CHLORIDE 0.9 % IV SOLN
Freq: Once | INTRAVENOUS | Status: AC
  Administered 2016-03-29: 01:00:00 via INTRAVENOUS

## 2016-03-28 MED ORDER — SODIUM CHLORIDE 0.9% FLUSH: 3.0000 mL | INTRAVENOUS | Status: DC | PRN

## 2016-03-28 MED ORDER — SODIUM CHLORIDE 0.9 % IV SOLN
INTRAVENOUS | Status: DC
  Administered 2016-03-28: 7.7 [IU]/h via INTRAVENOUS
  Filled 2016-03-28 (×2): qty 2.5

## 2016-03-28 MED ORDER — INSULIN REGULAR BOLUS VIA INFUSION
0.0000 [IU] | Freq: Three times a day (TID) | INTRAVENOUS | Status: DC
  Filled 2016-03-28: qty 10

## 2016-03-28 MED ORDER — ROCURONIUM BROMIDE 50 MG/5ML IV SOSY
PREFILLED_SYRINGE | INTRAVENOUS | Status: AC
  Filled 2016-03-28: qty 10

## 2016-03-28 MED ORDER — EPHEDRINE 5 MG/ML INJ
INTRAVENOUS | Status: AC
  Filled 2016-03-28: qty 10

## 2016-03-28 MED ORDER — DOPAMINE-DEXTROSE 3.2-5 MG/ML-% IV SOLN
0.0000 ug/kg/min | INTRAVENOUS | Status: DC
  Filled 2016-03-28: qty 250

## 2016-03-28 MED ORDER — ORAL CARE MOUTH RINSE
15.0000 mL | OROMUCOSAL | Status: DC
  Administered 2016-03-29 – 2016-03-30 (×13): 15 mL via OROMUCOSAL

## 2016-03-28 MED ORDER — MORPHINE SULFATE (PF) 2 MG/ML IV SOLN
1.0000 mg | INTRAVENOUS | Status: AC | PRN
  Administered 2016-03-28 (×2): 2 mg via INTRAVENOUS
  Administered 2016-03-29: 4 mg via INTRAVENOUS
  Filled 2016-03-28 (×2): qty 2

## 2016-03-28 MED ORDER — ACETAMINOPHEN 160 MG/5ML PO SOLN: 650.0000 mg | Freq: Once | ORAL | Status: DC

## 2016-03-28 MED ORDER — SODIUM CHLORIDE 0.9 % IV SOLN
30.0000 meq | Freq: Once | INTRAVENOUS | Status: AC
  Administered 2016-03-28: 30 meq via INTRAVENOUS
  Filled 2016-03-28: qty 15

## 2016-03-28 MED ORDER — ETOMIDATE 2 MG/ML IV SOLN
INTRAVENOUS | Status: DC | PRN
  Administered 2016-03-28: 20 mg via INTRAVENOUS

## 2016-03-28 MED ORDER — MIDAZOLAM HCL 10 MG/2ML IJ SOLN
INTRAMUSCULAR | Status: AC
  Filled 2016-03-28: qty 2

## 2016-03-28 MED ORDER — EPHEDRINE SULFATE-NACL 50-0.9 MG/10ML-% IV SOSY
PREFILLED_SYRINGE | INTRAVENOUS | Status: DC | PRN
  Administered 2016-03-28 (×2): 5 mg via INTRAVENOUS

## 2016-03-28 MED ORDER — MAGNESIUM SULFATE 4 GM/100ML IV SOLN: 4.0000 g | Freq: Once | INTRAVENOUS | Status: DC

## 2016-03-28 MED ORDER — ROCURONIUM BROMIDE 10 MG/ML (PF) SYRINGE
PREFILLED_SYRINGE | INTRAVENOUS | Status: DC | PRN
  Administered 2016-03-28 (×2): 50 mg via INTRAVENOUS

## 2016-03-28 MED ORDER — ALBUMIN HUMAN 5 % IV SOLN
INTRAVENOUS | Status: DC | PRN
  Administered 2016-03-28 (×3): via INTRAVENOUS

## 2016-03-28 MED ORDER — LACTATED RINGERS IV SOLN
500.0000 mL | Freq: Once | INTRAVENOUS | Status: DC | PRN
Start: 2016-03-28 — End: 2016-03-28

## 2016-03-28 MED ORDER — HYDROCORTISONE NA SUCCINATE PF 100 MG IJ SOLR
INTRAMUSCULAR | Status: DC | PRN
  Administered 2016-03-28: 125 mg via INTRAVENOUS

## 2016-03-28 MED ORDER — FENTANYL CITRATE (PF) 250 MCG/5ML IJ SOLN
INTRAMUSCULAR | Status: AC
  Filled 2016-03-28: qty 25

## 2016-03-28 MED ORDER — ROCURONIUM BROMIDE 100 MG/10ML IV SOLN
INTRAVENOUS | Status: DC | PRN
  Administered 2016-03-28: 50 mg via INTRAVENOUS

## 2016-03-28 MED ORDER — DEXTROSE 5 % IV SOLN
0.0000 ug/min | INTRAVENOUS | Status: DC
  Administered 2016-03-28: 20 ug/min via INTRAVENOUS
  Administered 2016-03-29: 10 ug/min via INTRAVENOUS
  Administered 2016-03-29 (×2): 20 ug/min via INTRAVENOUS
  Administered 2016-03-30: 11 ug/min via INTRAVENOUS
  Administered 2016-03-30: 18 ug/min via INTRAVENOUS
  Administered 2016-03-30: 16 ug/min via INTRAVENOUS
  Administered 2016-03-31: 17 ug/min via INTRAVENOUS
  Administered 2016-03-31 (×4): 19 ug/min via INTRAVENOUS
  Administered 2016-04-01 (×4): 20 ug/min via INTRAVENOUS
  Administered 2016-04-01: 19.5 ug/min via INTRAVENOUS
  Administered 2016-04-01: 19 ug/min via INTRAVENOUS
  Administered 2016-04-01: 20 ug/min via INTRAVENOUS
  Administered 2016-04-02: 13 ug/min via INTRAVENOUS
  Administered 2016-04-02: 14 ug/min via INTRAVENOUS
  Administered 2016-04-02: 17 ug/min via INTRAVENOUS
  Administered 2016-04-02: 16 ug/min via INTRAVENOUS
  Administered 2016-04-02: 14 ug/min via INTRAVENOUS
  Administered 2016-04-03: 15 ug/min via INTRAVENOUS
  Administered 2016-04-03: 14 ug/min via INTRAVENOUS
  Filled 2016-03-28 (×35): qty 4

## 2016-03-28 MED ORDER — METOPROLOL TARTRATE 5 MG/5ML IV SOLN: 2.5000 mg | INTRAVENOUS | Status: DC | PRN

## 2016-03-28 MED ORDER — DESMOPRESSIN ACETATE 4 MCG/ML IJ SOLN
20.0000 ug | INTRAMUSCULAR | Status: AC
  Administered 2016-03-28: 20 ug via INTRAVENOUS
  Filled 2016-03-28: qty 5

## 2016-03-28 MED ORDER — METOPROLOL TARTRATE 12.5 MG HALF TABLET: 12.5000 mg | ORAL_TABLET | Freq: Two times a day (BID) | ORAL | Status: DC

## 2016-03-28 MED ORDER — BISACODYL 10 MG RE SUPP
10.0000 mg | Freq: Every day | RECTAL | Status: DC
  Administered 2016-03-30 – 2016-04-01 (×2): 10 mg via RECTAL
  Filled 2016-03-28 (×2): qty 1

## 2016-03-28 MED ORDER — HYDROCORTISONE NA SUCCINATE PF 250 MG IJ SOLR
INTRAMUSCULAR | Status: AC
  Filled 2016-03-28: qty 250

## 2016-03-28 MED ORDER — VANCOMYCIN HCL IN DEXTROSE 1-5 GM/200ML-% IV SOLN
1000.0000 mg | Freq: Once | INTRAVENOUS | Status: AC
  Administered 2016-03-28: 1000 mg via INTRAVENOUS
  Filled 2016-03-28: qty 200

## 2016-03-28 MED ORDER — DEXTROSE 5 % IV SOLN
INTRAVENOUS | Status: DC | PRN
  Administered 2016-03-28: 10 ug/min via INTRAVENOUS

## 2016-03-28 MED ORDER — ASPIRIN 81 MG PO CHEW
324.0000 mg | CHEWABLE_TABLET | Freq: Every day | ORAL | Status: DC
  Administered 2016-03-31 – 2016-04-03 (×2): 324 mg
  Filled 2016-03-28 (×2): qty 4

## 2016-03-28 MED ORDER — SODIUM CHLORIDE 0.9 % IV SOLN
250.0000 mL | INTRAVENOUS | Status: DC
  Administered 2016-03-29: 250 mL via INTRAVENOUS

## 2016-03-28 MED ORDER — HEMOSTATIC AGENTS (NO CHARGE) OPTIME
TOPICAL | Status: DC | PRN
  Administered 2016-03-28: 2 via TOPICAL

## 2016-03-28 MED ORDER — FAMOTIDINE IN NACL 20-0.9 MG/50ML-% IV SOLN
20.0000 mg | Freq: Two times a day (BID) | INTRAVENOUS | Status: AC
  Administered 2016-03-28 – 2016-03-29 (×2): 20 mg via INTRAVENOUS
  Filled 2016-03-28: qty 50

## 2016-03-28 MED ORDER — PROTAMINE SULFATE 10 MG/ML IV SOLN
INTRAVENOUS | Status: DC | PRN
  Administered 2016-03-28: 20 mg via INTRAVENOUS
  Administered 2016-03-28: 60 mg via INTRAVENOUS
  Administered 2016-03-28: 40 mg via INTRAVENOUS
  Administered 2016-03-28 (×3): 20 mg via INTRAVENOUS
  Administered 2016-03-28: 60 mg via INTRAVENOUS
  Administered 2016-03-28: 20 mg via INTRAVENOUS
  Administered 2016-03-28: 40 mg via INTRAVENOUS

## 2016-03-28 MED ORDER — PROPOFOL 10 MG/ML IV BOLUS
INTRAVENOUS | Status: DC | PRN
  Administered 2016-03-28: 50 mg via INTRAVENOUS

## 2016-03-28 MED ORDER — COAGULATION FACTOR VIIA RECOMB 1 MG IV SOLR
90.0000 ug/kg | Freq: Once | INTRAVENOUS | Status: AC
  Administered 2016-03-28: 6000 ug via INTRAVENOUS
  Filled 2016-03-28: qty 6

## 2016-03-28 MED ORDER — FUROSEMIDE 10 MG/ML IJ SOLN
20.0000 mg | Freq: Once | INTRAMUSCULAR | Status: AC
  Administered 2016-03-28: 20 mg via INTRAVENOUS
  Filled 2016-03-28: qty 2

## 2016-03-28 MED ORDER — LACTATED RINGERS IV SOLN: INTRAVENOUS | Status: DC

## 2016-03-28 MED ORDER — MILRINONE LACTATE IN DEXTROSE 20-5 MG/100ML-% IV SOLN
0.1250 ug/kg/min | INTRAVENOUS | Status: AC
  Administered 2016-03-28: 0.25 ug/kg/min via INTRAVENOUS
  Filled 2016-03-28: qty 100

## 2016-03-28 MED ORDER — FENTANYL CITRATE (PF) 250 MCG/5ML IJ SOLN
INTRAMUSCULAR | Status: DC | PRN
  Administered 2016-03-28 (×2): 100 ug via INTRAVENOUS
  Administered 2016-03-28: 250 ug via INTRAVENOUS
  Administered 2016-03-28: 100 ug via INTRAVENOUS
  Administered 2016-03-28: 25 ug via INTRAVENOUS
  Administered 2016-03-28: 150 ug via INTRAVENOUS
  Administered 2016-03-28: 100 ug via INTRAVENOUS
  Administered 2016-03-28: 25 ug via INTRAVENOUS

## 2016-03-28 MED ORDER — CALCIUM CHLORIDE 10 % IV SOLN
INTRAVENOUS | Status: AC
  Filled 2016-03-28: qty 20

## 2016-03-28 MED ORDER — OXYCODONE HCL 5 MG PO TABS: 5.0000 mg | ORAL_TABLET | ORAL | Status: DC | PRN

## 2016-03-28 MED ORDER — DOCUSATE SODIUM 100 MG PO CAPS: 200.0000 mg | ORAL_CAPSULE | Freq: Every day | ORAL | Status: DC

## 2016-03-28 MED ORDER — SODIUM CHLORIDE 0.9 % IV SOLN
Freq: Once | INTRAVENOUS | Status: AC
  Administered 2016-03-28: 20:00:00 via INTRAVENOUS

## 2016-03-28 MED ORDER — ORAL CARE MOUTH RINSE: 15.0000 mL | Freq: Four times a day (QID) | OROMUCOSAL | Status: DC

## 2016-03-28 MED ORDER — CHLORHEXIDINE GLUCONATE 0.12% ORAL RINSE (MEDLINE KIT)
15.0000 mL | Freq: Two times a day (BID) | OROMUCOSAL | Status: DC
  Administered 2016-03-28: 15 mL via OROMUCOSAL

## 2016-03-28 MED ORDER — CHLORHEXIDINE GLUCONATE 0.12 % MT SOLN: 15.0000 mL | OROMUCOSAL | Status: AC

## 2016-03-28 MED ORDER — HEPARIN SODIUM (PORCINE) 1000 UNIT/ML IJ SOLN
INTRAMUSCULAR | Status: AC
  Filled 2016-03-28: qty 3

## 2016-03-28 MED ORDER — NITROGLYCERIN IN D5W 200-5 MCG/ML-% IV SOLN: 0.0000 ug/min | INTRAVENOUS | Status: DC

## 2016-03-28 MED ORDER — METOCLOPRAMIDE HCL 5 MG/ML IJ SOLN
10.0000 mg | Freq: Four times a day (QID) | INTRAMUSCULAR | Status: DC
  Administered 2016-03-29 (×2): 10 mg via INTRAVENOUS
  Filled 2016-03-28 (×2): qty 2

## 2016-03-28 MED ORDER — BISACODYL 5 MG PO TBEC: 10.0000 mg | DELAYED_RELEASE_TABLET | Freq: Every day | ORAL | Status: DC

## 2016-03-28 MED ORDER — SODIUM CHLORIDE 0.45 % IV SOLN: INTRAVENOUS | Status: DC | PRN

## 2016-03-28 MED ORDER — DEXTROSE 5 % IV SOLN
INTRAVENOUS | Status: DC | PRN
  Administered 2016-03-28: 1 ug/min via INTRAVENOUS

## 2016-03-28 MED ORDER — SODIUM CHLORIDE 0.9 % IV SOLN
0.0000 ug/min | INTRAVENOUS | Status: DC
  Administered 2016-03-28: 400 ug/min via INTRAVENOUS
  Filled 2016-03-28: qty 2

## 2016-03-28 MED ORDER — ACETAMINOPHEN 650 MG RE SUPP: 650.0000 mg | Freq: Once | RECTAL | Status: DC

## 2016-03-28 MED ORDER — HEMOSTATIC AGENTS (NO CHARGE) OPTIME
TOPICAL | Status: DC | PRN
  Administered 2016-03-28: 3 via TOPICAL

## 2016-03-28 MED ORDER — CHLORHEXIDINE GLUCONATE 0.12% ORAL RINSE (MEDLINE KIT)
15.0000 mL | Freq: Two times a day (BID) | OROMUCOSAL | Status: DC
  Administered 2016-03-29 – 2016-04-03 (×10): 15 mL via OROMUCOSAL

## 2016-03-28 MED ORDER — PANTOPRAZOLE SODIUM 40 MG PO TBEC: 40.0000 mg | DELAYED_RELEASE_TABLET | Freq: Every day | ORAL | Status: DC

## 2016-03-28 MED ORDER — SODIUM CHLORIDE 0.9% FLUSH
3.0000 mL | Freq: Two times a day (BID) | INTRAVENOUS | Status: DC
  Administered 2016-03-29 – 2016-03-31 (×4): 3 mL via INTRAVENOUS

## 2016-03-28 MED ORDER — EPINEPHRINE PF 1 MG/10ML IJ SOSY
PREFILLED_SYRINGE | INTRAMUSCULAR | Status: AC
  Filled 2016-03-28: qty 20

## 2016-03-28 MED ORDER — CALCIUM CHLORIDE 10 % IV SOLN
INTRAVENOUS | Status: DC | PRN
  Administered 2016-03-28 (×7): 13.6 meq via INTRAVENOUS

## 2016-03-28 MED ORDER — FUROSEMIDE 10 MG/ML IJ SOLN
40.0000 mg | Freq: Once | INTRAMUSCULAR | Status: AC
  Administered 2016-03-29: 40 mg via INTRAVENOUS
  Filled 2016-03-28: qty 4

## 2016-03-28 MED ORDER — SODIUM CHLORIDE 0.9 % IV SOLN
INTRAVENOUS | Status: DC | PRN
  Administered 2016-03-28 (×3): via INTRAVENOUS

## 2016-03-28 MED ORDER — ACETAMINOPHEN 160 MG/5ML PO SOLN
1000.0000 mg | Freq: Four times a day (QID) | ORAL | Status: AC
  Administered 2016-03-29 – 2016-04-02 (×19): 1000 mg
  Filled 2016-03-28 (×20): qty 40.6

## 2016-03-28 MED ORDER — MILRINONE LACTATE IN DEXTROSE 20-5 MG/100ML-% IV SOLN
0.3000 ug/kg/min | INTRAVENOUS | Status: DC
  Administered 2016-03-28: 0.3 ug/kg/min via INTRAVENOUS
  Administered 2016-03-29: 0.375 ug/kg/min via INTRAVENOUS
  Filled 2016-03-28 (×2): qty 100

## 2016-03-28 MED ORDER — 0.9 % SODIUM CHLORIDE (POUR BTL) OPTIME
TOPICAL | Status: DC | PRN
  Administered 2016-03-28: 4000 mL

## 2016-03-28 MED ORDER — ETOMIDATE 2 MG/ML IV SOLN
INTRAVENOUS | Status: AC
  Filled 2016-03-28: qty 20

## 2016-03-28 MED ORDER — ACETAMINOPHEN 500 MG PO TABS: 1000.0000 mg | ORAL_TABLET | Freq: Four times a day (QID) | ORAL | Status: AC

## 2016-03-28 MED ORDER — ASPIRIN EC 325 MG PO TBEC: 325.0000 mg | DELAYED_RELEASE_TABLET | Freq: Every day | ORAL | Status: DC

## 2016-03-28 MED FILL — Potassium Chloride Inj 2 mEq/ML: INTRAVENOUS | Qty: 40 | Status: AC

## 2016-03-28 MED FILL — Heparin Sodium (Porcine) Inj 1000 Unit/ML: INTRAMUSCULAR | Qty: 30 | Status: AC

## 2016-03-28 MED FILL — Magnesium Sulfate Inj 50%: INTRAMUSCULAR | Qty: 10 | Status: AC

## 2016-03-28 SURGICAL SUPPLY — 176 items
ADAPTER CARDIO PERF ANTE/RETRO (ADAPTER) IMPLANT
APPLIER CLIP 9.375 MED OPEN (MISCELLANEOUS)
APPLIER CLIP 9.375 SM OPEN (CLIP) ×5
BAG DECANTER FOR FLEXI CONT (MISCELLANEOUS) ×10 IMPLANT
BALLN LINEAR 7.5FR IABP 40CC (BALLOONS) ×5
BALLOON LINEAR 7.5FR IABP 40CC (BALLOONS) ×3 IMPLANT
BANDAGE ACE 4X5 VEL STRL LF (GAUZE/BANDAGES/DRESSINGS) ×5 IMPLANT
BANDAGE ACE 6X5 VEL STRL LF (GAUZE/BANDAGES/DRESSINGS) ×5 IMPLANT
BANDAGE ESMARK 6X9 LF (GAUZE/BANDAGES/DRESSINGS) ×3 IMPLANT
BLADE CLIPPER SURG (BLADE) ×5 IMPLANT
BLADE CORE FAN STRYKER (BLADE) ×10 IMPLANT
BLADE SAW SAG 29X58X.64 (BLADE) ×5 IMPLANT
BLADE STERNUM SYSTEM 6 (BLADE) ×5 IMPLANT
BLADE SURG 11 STRL SS (BLADE) ×5 IMPLANT
BLADE SURG 12 STRL SS (BLADE) ×5 IMPLANT
BLADE SURG 15 STRL LF DISP TIS (BLADE) ×3 IMPLANT
BLADE SURG 15 STRL SS (BLADE) ×2
BNDG ESMARK 6X9 LF (GAUZE/BANDAGES/DRESSINGS) ×5
BNDG GAUZE ELAST 4 BULKY (GAUZE/BANDAGES/DRESSINGS) ×5 IMPLANT
CANISTER SUCT 3000ML PPV (MISCELLANEOUS) ×5 IMPLANT
CANNULA GRAFT 8MMX50CM (Graft) ×5 IMPLANT
CANNULA GUNDRY RCSP 15FR (MISCELLANEOUS) ×5 IMPLANT
CANNULA SUMP PERICARDIAL (CANNULA) ×5 IMPLANT
CATH FOLEY 2WAY SLVR 18FR 30CC (CATHETERS) ×5 IMPLANT
CATH HEART VENT LEFT (CATHETERS) ×3 IMPLANT
CATH RETROPLEGIA CORONARY 14FR (CATHETERS) IMPLANT
CATH ROBINSON RED A/P 18FR (CATHETERS) IMPLANT
CATH THORACIC 28FR (CATHETERS) ×5 IMPLANT
CATH THORACIC 28FR RT ANG (CATHETERS) ×5 IMPLANT
CATH THORACIC 36FR (CATHETERS) ×5 IMPLANT
CATH THORACIC 36FR RT ANG (CATHETERS) ×10 IMPLANT
CLIP APPLIE 9.375 MED OPEN (MISCELLANEOUS) IMPLANT
CLIP APPLIE 9.375 SM OPEN (CLIP) ×3 IMPLANT
CLIP FOGARTY SPRING 6M (CLIP) IMPLANT
CLIP TI MEDIUM 24 (CLIP) IMPLANT
CLIP TI WIDE RED SMALL 24 (CLIP) IMPLANT
CONN 3/8X1/2 ST GISH (MISCELLANEOUS) ×5 IMPLANT
CONN Y 3/8X3/8X3/8  BEN (MISCELLANEOUS)
CONN Y 3/8X3/8X3/8 BEN (MISCELLANEOUS) IMPLANT
COUNTER NEEDLE 20 DBL MAG RED (NEEDLE) ×5 IMPLANT
COVER MAYO STAND STRL (DRAPES) ×10 IMPLANT
CRADLE DONUT ADULT HEAD (MISCELLANEOUS) ×5 IMPLANT
DERMABOND ADVANCED (GAUZE/BANDAGES/DRESSINGS) ×2
DERMABOND ADVANCED .7 DNX12 (GAUZE/BANDAGES/DRESSINGS) ×3 IMPLANT
DRAPE CARDIOVASCULAR INCISE (DRAPES) ×2
DRAPE HALF SHEET 40X57 (DRAPES) ×5 IMPLANT
DRAPE PROXIMA HALF (DRAPES) IMPLANT
DRAPE SLUSH/WARMER DISC (DRAPES) IMPLANT
DRAPE SRG 135X102X78XABS (DRAPES) ×3 IMPLANT
DRSG AQUACEL AG ADV 3.5X14 (GAUZE/BANDAGES/DRESSINGS) ×5 IMPLANT
DRSG VAC ATS LRG SENSATRAC (GAUZE/BANDAGES/DRESSINGS) ×5 IMPLANT
ELECT BLADE 6.5 EXT (BLADE) ×5 IMPLANT
ELECT CAUTERY BLADE 6.4 (BLADE) IMPLANT
ELECT REM PT RETURN 9FT ADLT (ELECTROSURGICAL) ×10
ELECTRODE REM PT RTRN 9FT ADLT (ELECTROSURGICAL) ×6 IMPLANT
FELT TEFLON 1X6 (MISCELLANEOUS) ×15 IMPLANT
GAUZE SPONGE 4X4 12PLY STRL (GAUZE/BANDAGES/DRESSINGS) ×10 IMPLANT
GAUZE XEROFORM 1X8 LF (GAUZE/BANDAGES/DRESSINGS) ×5 IMPLANT
GAUZE XEROFORM 5X9 LF (GAUZE/BANDAGES/DRESSINGS) ×5 IMPLANT
GEL ULTRASOUND 20GR AQUASONIC (MISCELLANEOUS) ×5 IMPLANT
GLOVE BIO SURGEON STRL SZ 6 (GLOVE) ×5 IMPLANT
GLOVE BIO SURGEON STRL SZ 6.5 (GLOVE) ×40 IMPLANT
GLOVE BIO SURGEON STRL SZ7 (GLOVE) ×5 IMPLANT
GLOVE BIO SURGEON STRL SZ7.5 (GLOVE) ×10 IMPLANT
GLOVE BIO SURGEONS STRL SZ 6.5 (GLOVE) ×10
GLOVE BIOGEL PI IND STRL 6 (GLOVE) ×3 IMPLANT
GLOVE BIOGEL PI IND STRL 6.5 (GLOVE) ×3 IMPLANT
GLOVE BIOGEL PI IND STRL 7.0 (GLOVE) ×3 IMPLANT
GLOVE BIOGEL PI INDICATOR 6 (GLOVE) ×2
GLOVE BIOGEL PI INDICATOR 6.5 (GLOVE) ×2
GLOVE BIOGEL PI INDICATOR 7.0 (GLOVE) ×2
GLOVE EUDERMIC 7 POWDERFREE (GLOVE) ×5 IMPLANT
GOWN STRL REUS W/ TWL LRG LVL3 (GOWN DISPOSABLE) ×12 IMPLANT
GOWN STRL REUS W/TWL LRG LVL3 (GOWN DISPOSABLE) ×8
GRAFT WOVEN D/V 28DX30L (Vascular Products) ×5 IMPLANT
HANDLE STAPLE ENDO GIA SHORT (STAPLE) ×2
HARMONIC SHEARS 14CM COAG (MISCELLANEOUS) ×5 IMPLANT
HEMOSTAT POWDER SURGIFOAM 1G (HEMOSTASIS) IMPLANT
HEMOSTAT SURGICEL 2X14 (HEMOSTASIS) ×5 IMPLANT
INSERT FOGARTY 61MM (MISCELLANEOUS) ×5 IMPLANT
INSERT FOGARTY XLG (MISCELLANEOUS) IMPLANT
KIT BASIN OR (CUSTOM PROCEDURE TRAY) ×5 IMPLANT
KIT CATH SUCT 8FR (CATHETERS) ×5 IMPLANT
KIT DILATOR VASC 18G NDL (KITS) ×10 IMPLANT
KIT DRAINAGE VACCUM ASSIST (KITS) ×5 IMPLANT
KIT ROOM TURNOVER OR (KITS) ×5 IMPLANT
KIT SUCTION CATH 14FR (SUCTIONS) ×5 IMPLANT
KIT VASOVIEW HEMOPRO VH 3000 (KITS) ×5 IMPLANT
LINE VENT (MISCELLANEOUS) ×5 IMPLANT
LOOP VESSEL MINI RED (MISCELLANEOUS) ×5 IMPLANT
LOOP VESSEL SUPERMAXI WHITE (MISCELLANEOUS) ×5 IMPLANT
MARKER GRAFT CORONARY BYPASS (MISCELLANEOUS) IMPLANT
NEEDLE AORTIC AIR ASPIRATING (NEEDLE) ×5 IMPLANT
NEEDLE PERC 18GX7CM (NEEDLE) ×5 IMPLANT
NS IRRIG 1000ML POUR BTL (IV SOLUTION) ×20 IMPLANT
PACK OPEN HEART (CUSTOM PROCEDURE TRAY) ×5 IMPLANT
PAD ARMBOARD 7.5X6 YLW CONV (MISCELLANEOUS) ×10 IMPLANT
PAD DEFIB R2 (MISCELLANEOUS) IMPLANT
PAD ELECT DEFIB RADIOL ZOLL (MISCELLANEOUS) ×5 IMPLANT
PENCIL BUTTON HOLSTER BLD 10FT (ELECTRODE) IMPLANT
POWDER SURGICEL 3.0 GRAM (HEMOSTASIS) ×4 IMPLANT
PUNCH AORTIC ROTATE 4.0MM (MISCELLANEOUS) IMPLANT
PUNCH AORTIC ROTATE 4.5MM 8IN (MISCELLANEOUS) IMPLANT
PUNCH AORTIC ROTATE 5MM 8IN (MISCELLANEOUS) IMPLANT
RELOAD ENDO GIA 30 3.5 (STAPLE) ×5 IMPLANT
SEALANT SURG COSEAL 8ML (VASCULAR PRODUCTS) ×5 IMPLANT
SENSOR MYOCARDIAL TEMP (MISCELLANEOUS) ×5 IMPLANT
SET CARDIOPLEGIA MPS 5001102 (MISCELLANEOUS) ×5 IMPLANT
SHEATH AVANTI 11CM 5FR (MISCELLANEOUS) ×5 IMPLANT
SOLUTION ANTI FOG 6CC (MISCELLANEOUS) ×10 IMPLANT
SPONGE GAUZE 4X4 12PLY STER LF (GAUZE/BANDAGES/DRESSINGS) ×5 IMPLANT
SPONGE INTESTINAL PEANUT (DISPOSABLE) ×5 IMPLANT
SPONGE LAP 18X18 X RAY DECT (DISPOSABLE) ×10 IMPLANT
SPONGE LAP 4X18 X RAY DECT (DISPOSABLE) ×5 IMPLANT
STAPLER ENDO GIA 12MM SHORT (STAPLE) ×3 IMPLANT
STAPLER VISISTAT 35W (STAPLE) ×5 IMPLANT
STOPCOCK 4 WAY LG BORE MALE ST (IV SETS) ×5 IMPLANT
SURGIFLO W/THROMBIN 8M KIT (HEMOSTASIS) ×5 IMPLANT
SUT BONE WAX W31G (SUTURE) ×5 IMPLANT
SUT ETHIBOND 2 0 SH (SUTURE) ×2
SUT ETHIBOND 2 0 SH 36X2 (SUTURE) ×3 IMPLANT
SUT ETHIBOND NAB MH 2-0 36IN (SUTURE) ×25 IMPLANT
SUT MNCRL AB 4-0 PS2 18 (SUTURE) ×10 IMPLANT
SUT PROLENE 3 0 RB 1 (SUTURE) ×5 IMPLANT
SUT PROLENE 3 0 SH 1 (SUTURE) IMPLANT
SUT PROLENE 3 0 SH DA (SUTURE) ×65 IMPLANT
SUT PROLENE 3 0 SH1 36 (SUTURE) ×20 IMPLANT
SUT PROLENE 4 0 RB 1 (SUTURE) ×38
SUT PROLENE 4 0 SH DA (SUTURE) ×55 IMPLANT
SUT PROLENE 4-0 RB1 .5 CRCL 36 (SUTURE) ×57 IMPLANT
SUT PROLENE 5 0 C 1 36 (SUTURE) ×10 IMPLANT
SUT PROLENE 6 0 C 1 30 (SUTURE) ×25 IMPLANT
SUT PROLENE 6 0 CC (SUTURE) ×30 IMPLANT
SUT PROLENE 7 0 BV 1 (SUTURE) ×5 IMPLANT
SUT PROLENE 7 0 BV1 MDA (SUTURE) ×5 IMPLANT
SUT PROLENE 7 0 DA (SUTURE) IMPLANT
SUT PROLENE 8 0 BV175 6 (SUTURE) IMPLANT
SUT PROLENE BLUE 7 0 (SUTURE) ×5 IMPLANT
SUT PROLENE POLY MONO (SUTURE) ×5 IMPLANT
SUT SILK  1 MH (SUTURE) ×4
SUT SILK 1 MH (SUTURE) ×6 IMPLANT
SUT SILK 2 0 SH CR/8 (SUTURE) ×15 IMPLANT
SUT SILK 3 0 SH CR/8 (SUTURE) ×5 IMPLANT
SUT STEEL 6MS V (SUTURE) ×5 IMPLANT
SUT STEEL STERNAL CCS#1 18IN (SUTURE) IMPLANT
SUT STEEL SZ 6 DBL 3X14 BALL (SUTURE) IMPLANT
SUT VIC AB 1 CTX 18 (SUTURE) ×5 IMPLANT
SUT VIC AB 1 CTX 36 (SUTURE)
SUT VIC AB 1 CTX36XBRD ANBCTR (SUTURE) IMPLANT
SUT VIC AB 2-0 CT1 27 (SUTURE) ×4
SUT VIC AB 2-0 CT1 TAPERPNT 27 (SUTURE) ×6 IMPLANT
SUT VIC AB 2-0 CTX 27 (SUTURE) IMPLANT
SUT VIC AB 3-0 SH 27 (SUTURE) ×4
SUT VIC AB 3-0 SH 27X BRD (SUTURE) ×6 IMPLANT
SUT VIC AB 3-0 SH 8-18 (SUTURE) ×5 IMPLANT
SUT VIC AB 3-0 X1 27 (SUTURE) IMPLANT
SUTURE E-PAK OPEN HEART (SUTURE) ×5 IMPLANT
SYR 50ML SLIP (SYRINGE) IMPLANT
SYSTEM SAHARA CHEST DRAIN ATS (WOUND CARE) ×5 IMPLANT
TAPE CLOTH SURG 4X10 WHT LF (GAUZE/BANDAGES/DRESSINGS) ×10 IMPLANT
TAPE PAPER 2X10 WHT MICROPORE (GAUZE/BANDAGES/DRESSINGS) ×5 IMPLANT
TOWEL GREEN STERILE (TOWEL DISPOSABLE) ×20 IMPLANT
TOWEL GREEN STERILE FF (TOWEL DISPOSABLE) ×10 IMPLANT
TOWEL OR 17X24 6PK STRL BLUE (TOWEL DISPOSABLE) ×5 IMPLANT
TOWEL OR 17X26 10 PK STRL BLUE (TOWEL DISPOSABLE) ×5 IMPLANT
TRAY CATH LUMEN 1 20CM STRL (SET/KITS/TRAYS/PACK) ×10 IMPLANT
TRAY FOLEY IC TEMP SENS 16FR (CATHETERS) ×5 IMPLANT
TUBE CONNECTING 12'X1/4 (SUCTIONS) ×2
TUBE CONNECTING 12X1/4 (SUCTIONS) ×8 IMPLANT
TUBING ART PRESS 48 MALE/FEM (TUBING) ×15 IMPLANT
TUBING INSUFFLATION (TUBING) ×5 IMPLANT
UNDERPAD 30X30 (UNDERPADS AND DIAPERS) ×5 IMPLANT
VENT LEFT HEART 12002 (CATHETERS) ×5
WATER STERILE IRR 1000ML POUR (IV SOLUTION) ×10 IMPLANT
WIRE BENTSON .035X145CM (WIRE) ×5 IMPLANT
YANKAUER SUCT BULB TIP NO VENT (SUCTIONS) ×10 IMPLANT

## 2016-03-28 NOTE — Transfer of Care (Addendum)
Immediate Anesthesia Transfer of Care Note  Patient: Douglas Mccoy  Procedure(s) Performed: Procedure(s) with comments: REDO CORONARY ARTERY BYPASS GRAFTING x 2 -SVG to OM -SVG to PDA, ENDOSCOPIC HARVEST GREATER SAPHENOUS VEIN -Left Leg AXILLARY CANNULATION, HYPOTHERMIC CIRCULATORY ARREST, REPAIR OF ASCENDING AORTIC INJURY -28 Hemashield Graft, Insertion of Balloon Pump (N/A) - Right axillary cannulation TRANSESOPHAGEAL ECHOCARDIOGRAM (TEE) (N/A)  Patient Location: SICU  Anesthesia Type:General  Level of Consciousness: Patient remains intubated per anesthesia plan  Airway & Oxygen Therapy: Patient remains intubated per anesthesia plan and Patient placed on Ventilator (see vital sign flow sheet for setting)  Post-op Assessment: Report given to RN and Post -op Vital signs reviewed and stable  Post vital signs: Reviewed.  Patient remains on multiple pressors.  Anesthesiologist at bedside for transport.    Last Vitals:  Vitals:   03/18/2016 0508 03/21/2016 1800  BP: (!) 143/49   Pulse: 60 90  Resp: 18 12  Temp: 36.9 C     Last Pain:  Vitals:   04/10/2016 0508  TempSrc: Oral  PainSc:          Complications: No apparent anesthesia complications

## 2016-03-28 NOTE — Anesthesia Procedure Notes (Signed)
Procedure Name: Intubation Date/Time: 04/13/2016 7:52 AM Performed by: Valda Favia Pre-anesthesia Checklist: Patient identified, Emergency Drugs available, Suction available, Patient being monitored and Timeout performed Patient Re-evaluated:Patient Re-evaluated prior to inductionOxygen Delivery Method: Circle system utilized Preoxygenation: Pre-oxygenation with 100% oxygen Intubation Type: IV induction Ventilation: Mask ventilation without difficulty and Oral airway inserted - appropriate to patient size Laryngoscope Size: Mac and 4 Grade View: Grade I Tube type: Subglottic suction tube Tube size: 7.5 mm Number of attempts: 1 Airway Equipment and Method: Stylet Placement Confirmation: ETT inserted through vocal cords under direct vision,  positive ETCO2 and breath sounds checked- equal and bilateral Secured at: 20 cm Tube secured with: Tape Dental Injury: Teeth and Oropharynx as per pre-operative assessment

## 2016-03-28 NOTE — Progress Notes (Signed)
CT surgery p.m. Rounds  Patient critically ill after redo CABG and replacement of ascending aorta with circulatory arrest Patient has severe coagulopathy as well as severe RV dysfunction. Currently receiving blood products correction of coagulopathy-INR 4.4, platelet count postop initially 25,000, PTT 110, fibrinogen still pending

## 2016-03-28 NOTE — Anesthesia Procedure Notes (Signed)
Anesthesia Procedure Note Procedures: Right IJ Gordy Councilman Catheter Insertion: [id number]: The patient was identified and consent obtained.  TO was performed, and full barrier precautions were used.  The skin was anesthetized with lidocaine-4cc plain with 25g needle.  Once the vein was located with the 22 ga. needle using ultrasound guidance , the wire was inserted into the vein.  The wire location was confirmed with ultrasound.  The tissue was dilated and the 8.5 [place] cordis catheter was carefully inserted. Afterwards Gordy Councilman catheter was inserted. PA catheter at 47cm.  The patient tolerated the procedure well.

## 2016-03-28 NOTE — Progress Notes (Signed)
The patient was examined and preop studies reviewed. There has been no change from the prior exam and the patient is ready for surgery.  plan redo CABG on H Lina

## 2016-03-28 NOTE — OR Nursing (Signed)
9276 chest incision made

## 2016-03-28 NOTE — Brief Op Note (Signed)
03/29/2016 - 03/26/2016  2:01 PM  PATIENT:  Douglas Mccoy  71 y.o. male  PRE-OPERATIVE DIAGNOSIS:  CAD  POST-OPERATIVE DIAGNOSIS:  CAD  PROCEDURE:  Procedure(s) with comments:  REDO CORONARY ARTERY BYPASS GRAFTING x 2 -SVG to OM -SVG to PDA  AXILLARY CANNULATION  HYPOTHERMIC CIRCULATORY ARREST  REPAIR OF ASCENDING AORTIC INJURY -28 Hemashield Graft  ENDOSCOPIC HARVEST GREATER SAPHENOUS VEIN -Left Leg  TRANSESOPHAGEAL ECHOCARDIOGRAM (TEE) (N/A)  SURGEON:  Surgeon(s) and Role:    * Ivin Poot, MD - Primary  PHYSICIAN ASSISTANT:   ANESTHESIA:   general  EBL:  Total I/O In: 4097 [I.V.:2400; Blood:2691; IV Piggyback:150] Out: 925 [Urine:925]  BLOOD ADMINISTERED:16U CC PRBC, 3000  CC CELLSAVER, 3 FFP, 10 Cryo and 2 PLTS  DRAINS: Mediastinal Chest Drains   LOCAL MEDICATIONS USED:  NONE  SPECIMEN:  No Specimen  DISPOSITION OF SPECIMEN:  N/A  COUNTS:  YES  TOURNIQUET:  * No tourniquets in log *  DICTATION: .Dragon Dictation  PLAN OF CARE: Admit to inpatient   PATIENT DISPOSITION:  ICU - intubated and hemodynamically stable.   Delay start of Pharmacological VTE agent (>24hrs) due to surgical blood loss or risk of bleeding: yes

## 2016-03-28 NOTE — Progress Notes (Signed)
  Echocardiogram Echocardiogram Transesophageal has been performed.  Douglas Mccoy 03/20/2016, 8:45 AM

## 2016-03-28 NOTE — Anesthesia Preprocedure Evaluation (Signed)
Anesthesia Evaluation  Patient identified by MRN, date of birth, ID band Patient awake    Reviewed: Allergy & Precautions, NPO status , Patient's Chart, lab work & pertinent test results  Airway Mallampati: II  TM Distance: >3 FB Neck ROM: Full    Dental no notable dental hx.    Pulmonary neg pulmonary ROS, former smoker,    Pulmonary exam normal breath sounds clear to auscultation       Cardiovascular hypertension, + angina + CAD and + Past MI   Rhythm:Regular Rate:Normal + Systolic murmurs Left ventricle: The cavity size was normal. Wall thickness was   increased in a pattern of mild LVH. Systolic function was normal.   The estimated ejection fraction was in the range of 50% to 55%.   Wall motion was normal; there were no regional wall motion   abnormalities. Doppler parameters are consistent with abnormal   left ventricular relaxation (grade 1 diastolic dysfunction).   Doppler parameters are consistent with high ventricular filling   pressure. - Aortic valve: Valve mobility was restricted. There was mild   regurgitation. - Aortic root: The aortic root was mildly dilated. - Mitral valve: Calcified annulus. There was mild regurgitation. - Left atrium: The atrium was mildly dilated. - Atrial septum: There was an atrial septal aneurysm.  Impressions:  - Low normal LV systolic function; mild LVH; grade 1 diastolic   dysfunction; elevated LV filling pressure; calcified aortic valve   with mild AI and no AS by doppler; mild MR; mild LAE.    Neuro/Psych negative neurological ROS  negative psych ROS   GI/Hepatic negative GI ROS, Neg liver ROS,   Endo/Other  diabetes  Renal/GU negative Renal ROS  negative genitourinary   Musculoskeletal negative musculoskeletal ROS (+)   Abdominal   Peds negative pediatric ROS (+)  Hematology  (+) anemia , thrombocytopenia   Anesthesia Other Findings   Reproductive/Obstetrics negative OB ROS                             Anesthesia Physical Anesthesia Plan  ASA: IV  Anesthesia Plan: General   Post-op Pain Management:    Induction: Intravenous  Airway Management Planned: Oral ETT  Additional Equipment: Arterial line, TEE, PA Cath and Ultrasound Guidance Line Placement  Intra-op Plan:   Post-operative Plan: Post-operative intubation/ventilation  Informed Consent: I have reviewed the patients History and Physical, chart, labs and discussed the procedure including the risks, benefits and alternatives for the proposed anesthesia with the patient or authorized representative who has indicated his/her understanding and acceptance.   Dental advisory given  Plan Discussed with: CRNA and Surgeon  Anesthesia Plan Comments:         Anesthesia Quick Evaluation

## 2016-03-28 NOTE — Progress Notes (Signed)
o2 sat on 100% fio2 ambu bagging from OR was 92-93%. Pt placed on 100% fio2 via vent- sat 93%.

## 2016-03-28 NOTE — OR Nursing (Signed)
Mottling noted to bilateral legs.

## 2016-03-28 NOTE — Brief Op Note (Signed)
Spoke to Dr [facility] regarding chest xray,  3.1 cm and 1 cm metallic shadow was noted,  Dr Prescott Gum informed.

## 2016-03-28 NOTE — OR Nursing (Signed)
Two rubber keepers left in the right groin of pt.

## 2016-03-29 ENCOUNTER — Inpatient Hospital Stay (HOSPITAL_COMMUNITY): Payer: Medicare Other

## 2016-03-29 ENCOUNTER — Inpatient Hospital Stay (HOSPITAL_COMMUNITY): Payer: Medicare Other | Admitting: Anesthesiology

## 2016-03-29 ENCOUNTER — Encounter (HOSPITAL_COMMUNITY): Payer: Self-pay

## 2016-03-29 ENCOUNTER — Encounter (HOSPITAL_COMMUNITY): Admission: EM | Disposition: E | Payer: Self-pay | Source: Home / Self Care | Attending: Internal Medicine

## 2016-03-29 DIAGNOSIS — R601 Generalized edema: Secondary | ICD-10-CM

## 2016-03-29 HISTORY — PX: STERNAL WOUND DEBRIDEMENT: SHX1058

## 2016-03-29 HISTORY — PX: APPLICATION OF WOUND VAC: SHX5189

## 2016-03-29 LAB — BPAM PLATELET PHERESIS
BLOOD PRODUCT EXPIRATION DATE: 201803162359
BLOOD PRODUCT EXPIRATION DATE: 201803162359
BLOOD PRODUCT EXPIRATION DATE: 201803182359
Blood Product Expiration Date: 201803162359
Blood Product Expiration Date: 201803172359
Blood Product Expiration Date: 201803182359
Blood Product Expiration Date: 201803182359
ISSUE DATE / TIME: 201803151517
ISSUE DATE / TIME: 201803151520
ISSUE DATE / TIME: 201803151520
ISSUE DATE / TIME: 201803151830
ISSUE DATE / TIME: 201803151830
ISSUE DATE / TIME: 201803161226
ISSUE DATE / TIME: 201803161226
UNIT TYPE AND RH: 7300
UNIT TYPE AND RH: 7300
UNIT TYPE AND RH: 7300
Unit Type and Rh: 6200
Unit Type and Rh: 5100
Unit Type and Rh: 9500
Unit Type and Rh: 6200

## 2016-03-29 LAB — BASIC METABOLIC PANEL
Anion gap: 8 (ref 5–15)
BUN: 22 mg/dL — ABNORMAL HIGH (ref 6–20)
CO2: 29 mmol/L (ref 22–32)
Calcium: 7.6 mg/dL — ABNORMAL LOW (ref 8.9–10.3)
Chloride: 108 mmol/L (ref 101–111)
Creatinine, Ser: 1.52 mg/dL — ABNORMAL HIGH (ref 0.61–1.24)
GFR calc Af Amer: 51 mL/min — ABNORMAL LOW (ref >60)
GFR calc non Af Amer: 44 mL/min — ABNORMAL LOW (ref >60)
Glucose, Bld: 142 mg/dL — ABNORMAL HIGH (ref 65–99)
Potassium: 3.9 mmol/L (ref 3.5–5.1)
Sodium: 145 mmol/L (ref 135–145)

## 2016-03-29 LAB — COOXEMETRY PANEL
Carboxyhemoglobin: 1.8 % — ABNORMAL HIGH (ref 0.5–1.5)
Carboxyhemoglobin: 1.1 % (ref 0.5–1.5)
Methemoglobin: 1.3 % (ref 0.0–1.5)
Methemoglobin: 1.4 % (ref 0.0–1.5)
O2 Saturation: 62.6 %
O2 Saturation: 76 %
Total hemoglobin: 10.6 g/dL — ABNORMAL LOW (ref 12.0–16.0)
Total hemoglobin: 10.8 g/dL — ABNORMAL LOW (ref 12.0–16.0)

## 2016-03-29 LAB — PREPARE PLATELET PHERESIS
UNIT DIVISION: 0
Unit division: 0
Unit division: 0
Unit division: 0
Unit division: 0
Unit division: 0
Unit division: 0

## 2016-03-29 LAB — POCT I-STAT 3, ART BLOOD GAS (G3+)
ACID-BASE EXCESS: 2 mmol/L (ref 0.0–2.0)
Acid-Base Excess: 4 mmol/L — ABNORMAL HIGH (ref 0.0–2.0)
Acid-Base Excess: 3 mmol/L — ABNORMAL HIGH (ref 0.0–2.0)
BICARBONATE: 29.6 mmol/L — AB (ref 20.0–28.0)
BICARBONATE: 27.7 mmol/L (ref 20.0–28.0)
Bicarbonate: 28.2 mmol/L — ABNORMAL HIGH (ref 20.0–28.0)
O2 SAT: 96 %
O2 SAT: 98 %
O2 Saturation: 99 %
PCO2 ART: 46.5 mmHg (ref 32.0–48.0)
PO2 ART: 170 mmHg — AB (ref 83.0–108.0)
TCO2: 31 mmol/L (ref 0–100)
TCO2: 29 mmol/L (ref 0–100)
TCO2: 30 mmol/L (ref 0–100)
pCO2 arterial: 49.8 mmHg — ABNORMAL HIGH (ref 32.0–48.0)
pCO2 arterial: 49.3 mmHg — ABNORMAL HIGH (ref 32.0–48.0)
pH, Arterial: 7.381 (ref 7.350–7.450)
pH, Arterial: 7.354 (ref 7.350–7.450)
pH, Arterial: 7.391 (ref 7.350–7.450)
pO2, Arterial: 87 mmHg (ref 83.0–108.0)
pO2, Arterial: 103 mmHg (ref 83.0–108.0)

## 2016-03-29 LAB — PREPARE RBC (CROSSMATCH)

## 2016-03-29 LAB — GLUCOSE, CAPILLARY
GLUCOSE-CAPILLARY: 157 mg/dL — AB (ref 65–99)
GLUCOSE-CAPILLARY: 133 mg/dL — AB (ref 65–99)
GLUCOSE-CAPILLARY: 145 mg/dL — AB (ref 65–99)
GLUCOSE-CAPILLARY: 131 mg/dL — AB (ref 65–99)
GLUCOSE-CAPILLARY: 87 mg/dL (ref 65–99)
GLUCOSE-CAPILLARY: 81 mg/dL (ref 65–99)
GLUCOSE-CAPILLARY: 87 mg/dL (ref 65–99)
Glucose-Capillary: 181 mg/dL — ABNORMAL HIGH (ref 65–99)
Glucose-Capillary: 149 mg/dL — ABNORMAL HIGH (ref 65–99)
Glucose-Capillary: 110 mg/dL — ABNORMAL HIGH (ref 65–99)
Glucose-Capillary: 98 mg/dL (ref 65–99)
Glucose-Capillary: 97 mg/dL (ref 65–99)
Glucose-Capillary: 95 mg/dL (ref 65–99)
Glucose-Capillary: 92 mg/dL (ref 65–99)
Glucose-Capillary: 84 mg/dL (ref 65–99)

## 2016-03-29 LAB — CBC
HCT: 30.1 % — ABNORMAL LOW (ref 39.0–52.0)
HCT: 29.8 % — ABNORMAL LOW (ref 39.0–52.0)
HEMATOCRIT: 25 % — AB (ref 39.0–52.0)
Hemoglobin: 8.5 g/dL — ABNORMAL LOW (ref 13.0–17.0)
Hemoglobin: 10.3 g/dL — ABNORMAL LOW (ref 13.0–17.0)
Hemoglobin: 10 g/dL — ABNORMAL LOW (ref 13.0–17.0)
MCH: 28.2 pg (ref 26.0–34.0)
MCH: 28.8 pg (ref 26.0–34.0)
MCH: 27.7 pg (ref 26.0–34.0)
MCHC: 34 g/dL (ref 30.0–36.0)
MCHC: 34.2 g/dL (ref 30.0–36.0)
MCHC: 33.6 g/dL (ref 30.0–36.0)
MCV: 83.1 fL (ref 78.0–100.0)
MCV: 84.1 fL (ref 78.0–100.0)
MCV: 82.5 fL (ref 78.0–100.0)
PLATELETS: 63 10*3/uL — AB (ref 150–400)
Platelets: 57 10*3/uL — ABNORMAL LOW (ref 150–400)
Platelets: 76 10*3/uL — ABNORMAL LOW (ref 150–400)
RBC: 3.01 MIL/uL — ABNORMAL LOW (ref 4.22–5.81)
RBC: 3.58 MIL/uL — ABNORMAL LOW (ref 4.22–5.81)
RBC: 3.61 MIL/uL — ABNORMAL LOW (ref 4.22–5.81)
RDW: 15.7 % — AB (ref 11.5–15.5)
RDW: 16.1 % — ABNORMAL HIGH (ref 11.5–15.5)
RDW: 16.1 % — ABNORMAL HIGH (ref 11.5–15.5)
WBC: 10.8 10*3/uL — AB (ref 4.0–10.5)
WBC: 12.2 10*3/uL — ABNORMAL HIGH (ref 4.0–10.5)
WBC: 14.1 10*3/uL — ABNORMAL HIGH (ref 4.0–10.5)

## 2016-03-29 LAB — DIC (DISSEMINATED INTRAVASCULAR COAGULATION)PANEL
D-Dimer, Quant: 2.64 ug/mL-FEU — ABNORMAL HIGH (ref 0.00–0.50)
Fibrinogen: 284 mg/dL (ref 210–475)
INR: 1.23
Platelets: 61 10*3/uL — ABNORMAL LOW (ref 150–400)
Prothrombin Time: 15.6 seconds — ABNORMAL HIGH (ref 11.4–15.2)
Smear Review: NONE SEEN
aPTT: 40 seconds — ABNORMAL HIGH (ref 24–36)

## 2016-03-29 LAB — POCT I-STAT, CHEM 8
BUN: 26 mg/dL — ABNORMAL HIGH (ref 6–20)
CALCIUM ION: 1.05 mmol/L — AB (ref 1.15–1.40)
Chloride: 101 mmol/L (ref 101–111)
Creatinine, Ser: 2.1 mg/dL — ABNORMAL HIGH (ref 0.61–1.24)
Glucose, Bld: 84 mg/dL (ref 65–99)
HEMATOCRIT: 29 % — AB (ref 39.0–52.0)
HEMOGLOBIN: 9.9 g/dL — AB (ref 13.0–17.0)
Potassium: 4.1 mmol/L (ref 3.5–5.1)
SODIUM: 144 mmol/L (ref 135–145)
TCO2: 30 mmol/L (ref 0–100)

## 2016-03-29 LAB — BPAM CRYOPRECIPITATE
Blood Product Expiration Date: 201803151632
Blood Product Expiration Date: 201803152110
Blood Product Expiration Date: 201803160030
ISSUE DATE / TIME: 201803151518
ISSUE DATE / TIME: 201803151830
ISSUE DATE / TIME: 201803151903
Unit Type and Rh: 6200
Unit Type and Rh: 6200
Unit Type and Rh: 6200

## 2016-03-29 LAB — PREPARE CRYOPRECIPITATE
Unit division: 0
Unit division: 0
Unit division: 0

## 2016-03-29 LAB — HEMOGLOBIN AND HEMATOCRIT, BLOOD
HCT: 45.9 % (ref 39.0–52.0)
Hemoglobin: 15.8 g/dL (ref 13.0–17.0)

## 2016-03-29 LAB — MAGNESIUM: Magnesium: 1.3 mg/dL — ABNORMAL LOW (ref 1.7–2.4)

## 2016-03-29 SURGERY — DEBRIDEMENT, WOUND, STERNUM
Anesthesia: General

## 2016-03-29 MED ORDER — INSULIN REGULAR HUMAN 100 UNIT/ML IJ SOLN
INTRAMUSCULAR | Status: DC | PRN
  Administered 2016-03-29: 3.1 [IU]/h via INTRAVENOUS

## 2016-03-29 MED ORDER — DEXTROSE 5 % IV SOLN
1.5000 g | Freq: Two times a day (BID) | INTRAVENOUS | Status: DC
  Administered 2016-03-29 – 2016-03-31 (×4): 1.5 g via INTRAVENOUS
  Filled 2016-03-29 (×5): qty 1.5

## 2016-03-29 MED ORDER — METOCLOPRAMIDE HCL 5 MG/ML IJ SOLN
10.0000 mg | Freq: Four times a day (QID) | INTRAMUSCULAR | Status: AC
  Administered 2016-03-29 – 2016-04-02 (×16): 10 mg via INTRAVENOUS
  Filled 2016-03-29 (×15): qty 2

## 2016-03-29 MED ORDER — VANCOMYCIN HCL 1000 MG IV SOLR
INTRAVENOUS | Status: AC
  Filled 2016-03-29: qty 1000

## 2016-03-29 MED ORDER — SODIUM CHLORIDE 0.9 % IJ SOLN
OROMUCOSAL | Status: DC | PRN
  Administered 2016-03-29 (×2): 4 mL via TOPICAL

## 2016-03-29 MED ORDER — DEXTROSE 5 % IV SOLN
INTRAVENOUS | Status: DC | PRN
  Administered 2016-03-29: 10 ug/min via INTRAVENOUS

## 2016-03-29 MED ORDER — VANCOMYCIN HCL IN DEXTROSE 1-5 GM/200ML-% IV SOLN
1000.0000 mg | Freq: Once | INTRAVENOUS | Status: AC
  Administered 2016-03-29: 1000 mg via INTRAVENOUS
  Filled 2016-03-29: qty 200

## 2016-03-29 MED ORDER — FUROSEMIDE 10 MG/ML IJ SOLN
INTRAMUSCULAR | Status: DC | PRN
  Administered 2016-03-29: 40 mg via INTRAMUSCULAR

## 2016-03-29 MED ORDER — PROPOFOL 10 MG/ML IV BOLUS
INTRAVENOUS | Status: DC | PRN
  Administered 2016-03-29 (×2): 50 ug via INTRAVENOUS

## 2016-03-29 MED ORDER — MILRINONE LACTATE IN DEXTROSE 20-5 MG/100ML-% IV SOLN
0.3750 ug/kg/min | INTRAVENOUS | Status: DC
  Administered 2016-03-29 – 2016-04-03 (×9): 0.375 ug/kg/min via INTRAVENOUS
  Filled 2016-03-29 (×9): qty 100

## 2016-03-29 MED ORDER — SODIUM CHLORIDE 0.9 % IV SOLN: Freq: Once | INTRAVENOUS | Status: DC

## 2016-03-29 MED ORDER — HEMOSTATIC AGENTS (NO CHARGE) OPTIME
TOPICAL | Status: DC | PRN
  Administered 2016-03-29: 1 via TOPICAL

## 2016-03-29 MED ORDER — VANCOMYCIN HCL 1000 MG IV SOLR
INTRAVENOUS | Status: DC | PRN
  Administered 2016-03-29: 1000 mL

## 2016-03-29 MED ORDER — ROCURONIUM BROMIDE 50 MG/5ML IV SOSY
PREFILLED_SYRINGE | INTRAVENOUS | Status: AC
  Filled 2016-03-29: qty 5

## 2016-03-29 MED ORDER — 0.9 % SODIUM CHLORIDE (POUR BTL) OPTIME
TOPICAL | Status: DC | PRN
  Administered 2016-03-29: 2000 mL

## 2016-03-29 MED ORDER — SODIUM CHLORIDE 0.9 % IV SOLN
500.0000 mg | INTRAVENOUS | Status: DC
  Administered 2016-03-29 – 2016-03-30 (×2): 500 mg via INTRAVENOUS
  Filled 2016-03-29 (×2): qty 5

## 2016-03-29 MED ORDER — FUROSEMIDE 10 MG/ML IJ SOLN
15.0000 mg/h | INTRAVENOUS | Status: DC
  Administered 2016-03-29 – 2016-03-31 (×3): 10 mg/h via INTRAVENOUS
  Administered 2016-03-31 – 2016-04-02 (×4): 15 mg/h via INTRAVENOUS
  Filled 2016-03-29 (×12): qty 25

## 2016-03-29 MED ORDER — PROPOFOL 10 MG/ML IV BOLUS
INTRAVENOUS | Status: AC
  Filled 2016-03-29: qty 20

## 2016-03-29 MED ORDER — CHLORHEXIDINE GLUCONATE CLOTH 2 % EX PADS
6.0000 | MEDICATED_PAD | Freq: Every day | CUTANEOUS | Status: DC
  Administered 2016-03-29 – 2016-04-01 (×4): 6 via TOPICAL

## 2016-03-29 MED ORDER — PHENYLEPHRINE 40 MCG/ML (10ML) SYRINGE FOR IV PUSH (FOR BLOOD PRESSURE SUPPORT)
PREFILLED_SYRINGE | INTRAVENOUS | Status: AC
  Filled 2016-03-29: qty 10

## 2016-03-29 MED ORDER — INSULIN ASPART 100 UNIT/ML ~~LOC~~ SOLN
0.0000 [IU] | SUBCUTANEOUS | Status: DC
  Administered 2016-03-30: 4 [IU] via SUBCUTANEOUS
  Administered 2016-03-30: 2 [IU] via SUBCUTANEOUS
  Administered 2016-03-30 (×3): 4 [IU] via SUBCUTANEOUS
  Administered 2016-03-30: 2 [IU] via SUBCUTANEOUS
  Administered 2016-03-30 – 2016-03-31 (×3): 4 [IU] via SUBCUTANEOUS
  Administered 2016-03-31 – 2016-04-03 (×8): 2 [IU] via SUBCUTANEOUS

## 2016-03-29 MED ORDER — MUPIROCIN 2 % EX OINT
1.0000 "application " | TOPICAL_OINTMENT | Freq: Two times a day (BID) | CUTANEOUS | Status: DC
  Administered 2016-03-29 – 2016-04-02 (×9): 1 via NASAL
  Filled 2016-03-29 (×2): qty 22

## 2016-03-29 MED ORDER — LEVALBUTEROL HCL 1.25 MG/0.5ML IN NEBU
1.2500 mg | INHALATION_SOLUTION | Freq: Four times a day (QID) | RESPIRATORY_TRACT | Status: DC
  Administered 2016-03-29 – 2016-04-02 (×18): 1.25 mg via RESPIRATORY_TRACT
  Filled 2016-03-29 (×18): qty 0.5

## 2016-03-29 MED ORDER — LACTATED RINGERS IV SOLN
INTRAVENOUS | Status: DC | PRN
Start: 2016-03-29 — End: 2016-03-29
  Administered 2016-03-29: 12:00:00 via INTRAVENOUS

## 2016-03-29 MED FILL — Sodium Bicarbonate IV Soln 8.4%: INTRAVENOUS | Qty: 400 | Status: AC

## 2016-03-29 MED FILL — Electrolyte-R (PH 7.4) Solution: INTRAVENOUS | Qty: 7000 | Status: AC

## 2016-03-29 MED FILL — Heparin Sodium (Porcine) Inj 1000 Unit/ML: INTRAMUSCULAR | Qty: 90 | Status: AC

## 2016-03-29 MED FILL — Electrolyte-R (PH 7.4) Solution: INTRAVENOUS | Qty: 6000 | Status: AC

## 2016-03-29 MED FILL — Sodium Chloride IV Soln 0.9%: INTRAVENOUS | Qty: 5000 | Status: AC

## 2016-03-29 MED FILL — Heparin Sodium (Porcine) Inj 1000 Unit/ML: INTRAMUSCULAR | Qty: 10 | Status: AC

## 2016-03-29 MED FILL — Sodium Chloride IV Soln 0.9%: INTRAVENOUS | Qty: 6000 | Status: AC

## 2016-03-29 MED FILL — Lidocaine HCl IV Inj 20 MG/ML: INTRAVENOUS | Qty: 15 | Status: AC

## 2016-03-29 MED FILL — Mannitol IV Soln 20%: INTRAVENOUS | Qty: 500 | Status: AC

## 2016-03-29 MED FILL — Calcium Chloride Inj 10%: INTRAVENOUS | Qty: 10 | Status: AC

## 2016-03-29 SURGICAL SUPPLY — 78 items
ATTRACTOMAT 16X20 MAGNETIC DRP (DRAPES) ×3 IMPLANT
BAG DECANTER FOR FLEXI CONT (MISCELLANEOUS) ×3 IMPLANT
BANDAGE ESMARK 6X9 LF (GAUZE/BANDAGES/DRESSINGS) ×1 IMPLANT
BENZOIN TINCTURE PRP APPL 2/3 (GAUZE/BANDAGES/DRESSINGS) ×6 IMPLANT
BLADE SURG 10 STRL SS (BLADE) ×3 IMPLANT
BLADE SURG 15 STRL LF DISP TIS (BLADE) IMPLANT
BLADE SURG 15 STRL SS (BLADE)
BNDG ESMARK 6X9 LF (GAUZE/BANDAGES/DRESSINGS) ×3
BNDG GAUZE ELAST 4 BULKY (GAUZE/BANDAGES/DRESSINGS) IMPLANT
CANISTER SUCT 3000ML PPV (MISCELLANEOUS) ×3 IMPLANT
CATH FOLEY 2WAY SLVR  5CC 16FR (CATHETERS)
CATH FOLEY 2WAY SLVR 5CC 16FR (CATHETERS) IMPLANT
CATH THORACIC 28FR RT ANG (CATHETERS) IMPLANT
CATH THORACIC 36FR (CATHETERS) IMPLANT
CATH THORACIC 36FR RT ANG (CATHETERS) IMPLANT
CLIP TI MEDIUM 24 (CLIP) ×3 IMPLANT
CLIP TI WIDE RED SMALL 24 (CLIP) ×3 IMPLANT
CONN ST 1/4X3/8  BEN (MISCELLANEOUS) ×4
CONN ST 1/4X3/8 BEN (MISCELLANEOUS) ×2 IMPLANT
CONN Y 3/8X3/8X3/8  BEN (MISCELLANEOUS) ×2
CONN Y 3/8X3/8X3/8 BEN (MISCELLANEOUS) ×1 IMPLANT
CONT SPEC 4OZ CLIKSEAL STRL BL (MISCELLANEOUS) IMPLANT
COVER SURGICAL LIGHT HANDLE (MISCELLANEOUS) ×6 IMPLANT
DRAIN CHANNEL 28F RND 3/8 FF (WOUND CARE) ×3 IMPLANT
DRAIN CHANNEL 32F RND 10.7 FF (WOUND CARE) ×3 IMPLANT
DRAPE LAPAROSCOPIC ABDOMINAL (DRAPES) ×3 IMPLANT
DRAPE SLUSH/WARMER DISC (DRAPES) IMPLANT
DRAPE WARM FLUID 44X44 (DRAPE) IMPLANT
DRSG AQUACEL AG ADV 3.5X14 (GAUZE/BANDAGES/DRESSINGS) ×3 IMPLANT
DRSG PAD ABDOMINAL 8X10 ST (GAUZE/BANDAGES/DRESSINGS) IMPLANT
DRSG VAC ATS LRG SENSATRAC (GAUZE/BANDAGES/DRESSINGS) ×3 IMPLANT
ELECT REM PT RETURN 9FT ADLT (ELECTROSURGICAL) ×3
ELECTRODE REM PT RTRN 9FT ADLT (ELECTROSURGICAL) ×1 IMPLANT
GAUZE SPONGE 4X4 12PLY STRL (GAUZE/BANDAGES/DRESSINGS) ×3 IMPLANT
GAUZE XEROFORM 5X9 LF (GAUZE/BANDAGES/DRESSINGS) ×3 IMPLANT
GLOVE BIO SURGEON STRL SZ 6.5 (GLOVE) ×2 IMPLANT
GLOVE BIO SURGEON STRL SZ7.5 (GLOVE) ×6 IMPLANT
GLOVE BIO SURGEONS STRL SZ 6.5 (GLOVE) ×1
GLOVE BIOGEL PI IND STRL 6 (GLOVE) ×2 IMPLANT
GLOVE BIOGEL PI IND STRL 7.0 (GLOVE) ×2 IMPLANT
GLOVE BIOGEL PI INDICATOR 6 (GLOVE) ×4
GLOVE BIOGEL PI INDICATOR 7.0 (GLOVE) ×4
GOWN STRL REUS W/ TWL LRG LVL3 (GOWN DISPOSABLE) ×4 IMPLANT
GOWN STRL REUS W/TWL LRG LVL3 (GOWN DISPOSABLE) ×8
HANDPIECE INTERPULSE COAX TIP (DISPOSABLE) ×2
HEMOSTAT POWDER SURGIFOAM 1G (HEMOSTASIS) IMPLANT
HEMOSTAT SURGICEL 2X14 (HEMOSTASIS) IMPLANT
KIT BASIN OR (CUSTOM PROCEDURE TRAY) ×3 IMPLANT
KIT ROOM TURNOVER OR (KITS) ×3 IMPLANT
KIT SUCTION CATH 14FR (SUCTIONS) ×3 IMPLANT
NS IRRIG 1000ML POUR BTL (IV SOLUTION) ×3 IMPLANT
PACK CHEST (CUSTOM PROCEDURE TRAY) ×3 IMPLANT
PAD ARMBOARD 7.5X6 YLW CONV (MISCELLANEOUS) ×6 IMPLANT
SET HNDPC FAN SPRY TIP SCT (DISPOSABLE) ×1 IMPLANT
SOLUTION BETADINE 4OZ (MISCELLANEOUS) IMPLANT
SPONGE LAP 18X18 X RAY DECT (DISPOSABLE) ×3 IMPLANT
STAPLER VISISTAT 35W (STAPLE) IMPLANT
STRAP MONTGOMERY 1.25X11-1/8 (MISCELLANEOUS) IMPLANT
SUT ETHILON 3 0 FSL (SUTURE) IMPLANT
SUT PROLENE 3 0 SH DA (SUTURE) ×6 IMPLANT
SUT SILK  1 MH (SUTURE) ×4
SUT SILK 1 MH (SUTURE) ×2 IMPLANT
SUT STEEL 6MS V (SUTURE) IMPLANT
SUT STEEL STERNAL CCS#1 18IN (SUTURE) IMPLANT
SUT STEEL SZ 6 DBL 3X14 BALL (SUTURE) IMPLANT
SUT VIC AB 1 CTX 36 (SUTURE) ×4
SUT VIC AB 1 CTX36XBRD ANBCTR (SUTURE) ×2 IMPLANT
SUT VIC AB 2-0 CTX 27 (SUTURE) ×6 IMPLANT
SUT VIC AB 3-0 X1 27 (SUTURE) ×6 IMPLANT
SWAB COLLECTION DEVICE MRSA (MISCELLANEOUS) IMPLANT
SYR 5ML LL (SYRINGE) IMPLANT
SYR 5ML LUER SLIP (SYRINGE) ×3 IMPLANT
TOWEL OR 17X24 6PK STRL BLUE (TOWEL DISPOSABLE) ×3 IMPLANT
TOWEL OR 17X26 10 PK STRL BLUE (TOWEL DISPOSABLE) ×3 IMPLANT
TRAY FOLEY W/METER SILVER 16FR (SET/KITS/TRAYS/PACK) IMPLANT
TUBE ANAEROBIC SPECIMEN COL (MISCELLANEOUS) IMPLANT
WATER STERILE IRR 1000ML POUR (IV SOLUTION) ×3 IMPLANT
WND VAC CANISTER 500ML (MISCELLANEOUS) ×3 IMPLANT

## 2016-03-29 NOTE — Progress Notes (Signed)
      [address]       [place] [address]             [phone]      POD # 1 redo CABG, repair ascending aorta  Intubated, sedated  BP (!) 102/53   Pulse 94   Temp 97 F (36.1 C)   Resp 12   Ht 5\' 5"  (1.651 m)   Wt 191 lb 9.3 oz (86.9 kg) Comment: note: bed not zeroed  SpO2 98%   BMI 31.88 kg/m  IABP at 1:1 CI= 2.3 on epi and milrinone Lasix drip at 10 565 UO this shift Creatinine up from 1.5 to 2.1 Hct= 29  Remains critically ill Continue current measures  Remo Lipps C. Roxan Hockey, MD [facility] [phone]

## 2016-03-29 NOTE — Progress Notes (Signed)
Initial Nutrition Assessment  DOCUMENTATION CODES:   Obesity unspecified  INTERVENTION:    If TF started, rec initiating Vital High Protein at goal rate of 40 ml/h (960 ml per day) and Prostat 30 ml TID   TF regimen to provide 1260 kcals, 129 gm protein, 803 ml free water daily  NUTRITION DIAGNOSIS:   Inadequate oral intake related to inability to eat as evidenced by NPO status  GOAL:   Provide needs based on ASPEN/SCCM guidelines  MONITOR:   Vent status, Labs, Weight trends, Skin, I & O's  REASON FOR ASSESSMENT:   Ventilator    ASSESSMENT:   71 y.o. Male with a history of CAD s/p CABG x 4 and R CEA in 2004, Hypertension, hyperlipidemia, COPD, diabetes, chronic kidney disease, multiple myeloma and recent multiple ER visits/admission for hypotension presents with recurrent syncope.  Pt s/p procedures 3/15: REDO CORONARY ARTERY BYPASS GRAFTING x 2  Patient is currently intubated on ventilator support Temp (24hrs), Avg:97.5 F (36.4 C), Min:95.2 F (35.1 C), Max:99.1 F (37.3 C) OGT in place  Pt currently back in OR. Prior to surgery, pt was on a Heart Healthy/Carbohydrate Modified diet.  PO intake 75-100% per flowsheets. Medications reviewed and include ABX and Precedex. Labs reviewed.  Magnesium 1.3 (L). CBG's [id number].  RD unable to complete Nutrition-Focused physical exam at this time.   Diet Order:  Diet NPO time specified  Skin:  Reviewed, no issues  Last BM:  3/15  Height:   Ht Readings from Last 1 Encounters:  03/31/2016 5' 5"  (1.651 m)    Weight:   Wt Readings from Last 1 Encounters:  04/02/2016 191 lb 9.3 oz (86.9 kg)    Ideal Body Weight:  61.8 kg  BMI:  Body mass index is 31.88 kg/m.  Estimated Nutritional Needs:   Kcal:  601-6580  Protein:  >/= 125 gm  Fluid:  per MD  EDUCATION NEEDS:   No education needs identified at this time  Arthur Holms, RD, LDN Pager #: [phone] After-Hours Pager #: [phone]

## 2016-03-29 NOTE — Op Note (Signed)
NAME:  Douglas Mccoy, Douglas Mccoy                    ACCOUNT NO.:  MEDICAL RECORD NO.:  [id number]  LOCATION:                                 FACILITY:  PHYSICIAN:  Ivin Poot, M.D.  DATE OF BIRTH:  November 25, 1945  DATE OF PROCEDURE:  03/23/2016 DATE OF DISCHARGE:                              OPERATIVE REPORT   OPERATION: 1. Redo coronary artery bypass grafting x2 (saphenous vein graft to     OM1, saphenous vein graft to posterior descending branch of RCA). 2. Endoscopic harvest of left greater saphenous vein. 3. Replacement of ascending aorta with a 28 mm Hemashield straight     graft under hypothermic circulatory arrest with antegrade cerebral     perfusion. 4. Placement of intra-aortic balloon pump via right femoral artery.  SURGEON:  Ivin Poot, MD.  ASSISTANTEllwood Handler, PA-C.  ANESTHESIA:  General by Dr. Myrtie Soman.  PREOPERATIVE DIAGNOSES: 1. Severe recurrent multivessel coronary artery disease with multiple     ER admissions from syncope related to ischemic cardiac disease. 2. History of multiple myeloma, status post bone marrow transplant and     currently receiving maintenance chemotherapy. 3. History of chronic thrombocytopenia from history of myeloma and     maintenance chemotherapy. 4. History of anemia. 5. Peripheral neuropathy.  POSTOPERATIVE DIAGNOSES: 1. Severe recurrent multivessel coronary artery disease with multiple     ER admissions from syncope related to ischemic cardiac disease. 2. History of multiple myeloma, status post bone marrow transplant and     currently receiving maintenance chemotherapy. 3. History of chronic thrombocytopenia from history of myeloma and     maintenance chemotherapy. 4. History of anemia. 5. Peripheral neuropathy.  CLINICAL NOTE:  The patient is a chronically ill, 71 year old, Caucasian male, who has been admitted and evaluated in the emergency department several times this past winter for episodes of syncope related  to his ischemic heart disease.  He was most recently evaluated with echo which showed minimal aortic stenosis and underwent cardiac catheterization since he has had previous CABG x4 at the [facility] in [place] in 2003. On his current admission, he had a syncopal episode immediately after eating a meal and was not orthostatic in etiology.  He has been extensively evaluated by Neurology and Cardiology and was felt to have ischemic cardiomyopathy as etiology of his syncopal episodes.  On his last cath, he was noted to have chronic occlusion of RCA, high-grade 95% stenosis of the circumflex marginal, and occlusion of the LAD which was perfused by a patent left IMA from his previous CABG.  His ejection fraction was 45% with inferior wall hypokinesia and he had mild mitral regurgitation and mild-to-moderate aortic insufficiency.  The patient was not felt to be a candidate for PCI for treatment of his ischemic heart disease.  A CT surgical evaluation for redo CABG was placed as the best potential long-term therapy for his ischemic heart disease.  The patient also has other significant medical problems including history of myeloma, status post bone marrow transplantation with maintenance chemotherapy directed by the H[facility]ology Oncology Clinic at the [facility].  He also has hypertension, anemia, episodes of multiple  syncope, history of renal failure, and hyperkalemia. After reviewing the patient's cardiac cath, echo, and his medical records as well as reviewing the op note from his surgery in 2003, I discussed the possibility of high risk redo CABG for treatment of the patient's severe recurrent coronary disease which has led to multiple readmissions for exercise and postprandial related syncope.  The patient's hematologic problem has been stable.  He is able to walk in the hallway, renal function currently is normal, and ejection fraction is 45%.  The patient was found to have adequate  residual vein in his left leg for redo bypass grafting.  I offered high risk redo CABG to the patient and he was very eager to have improved coronary perfusion to treat his ongoing problems with syncope.  I discussed the procedure, redo CABG in detail including the location of the incisions, use of cardiopulmonary bypass, and the expected postoperative recovery.  I discussed the potential risks to him including risk of MI, stroke, bleeding, blood transfusion requirement, multisystem failure, and death. 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  He understood these issues and agreed to proceed with surgery under what I felt was an informed consent.  OPERATIVE FINDINGS: 1. Adequate saphenous vein conduit harvested from the left leg. 2. Right axillary artery was used for a combined graft cannula for     arterial inflow and cardiopulmonary bypass due to a heavily     diseased aorta noted on the CT scan. 3. During careful sternal reentry with the oscillating saw, there was     catastrophic bleeding from the aorta which was fused to the inner     table of his sternum.  This required crashing on cardiopulmonary     bypass and proceeding with CABG under hypothermic circulatory     arrest as the aorta was extremely fragile and the site of profuse     bleeding. 4. RV dysfunction and severe coagulopathy following separation from     cardiopulmonary bypass and reversal of heparin with protamine,     requiring multiple blood product transfusion therapy and leaving     the sternum open and covered with an Esmarch sterile dressing.  OPERATIVE PROCEDURE:  The patient was brought to the operating room from the preop area where the patient was examined and assessed for surgery. General anesthesia was induced under invasive hemodynamic monitoring and a transesophageal  echo was placed.  The chest, abdomen, and legs were prepped with Betadine and draped as a sterile field.  A proper time-out was performed.  The previous sternal incision was opened and the sternal wires were removed, but the sternal bone was left intact.  At this point, I made an incision beneath the right axilla and the right axillary artery was dissected, encircled with a vessel loop, and exposed.  4000 units of heparin were administered and the axillary artery was clamped proximally and distally, and an end-to-side anastomosis with running 5-0 Prolene with a graft cannula was then placed for access for cardiopulmonary bypass.  This was clamped and carefully set off to the left side.  Next, the sternum was divided using the oscillating saw with care being taken to avoid injury to the underlying vascular structures.  The sternotomy was almost completely performed when there was catastrophic bleeding from the ascending aorta at the region of previous vein grafts. The aorta, in fact, was fused to  the inner table of the sternum.  Using the axillary artery cannula and a previously placed micro sheath in the right femoral vein, cardiopulmonary bypass was initiated using the axillary artery cannula, the pump suckers for retrieval of blood and a percutaneous 23- to 25-French venous cannula passed into the right atrium under echo guidance.  Because the venous drainage was poor from the femoral venous cannula, the right atrium was quickly and carefully dissected out, pursestring placed, and a standard 40-French venous cannula was inserted and used to provide adequate venous return.  At this point, the sternotomy was gently retracted.  The aorta was friable and thin and was repaired with multiple sutures of pledgeted Prolene.  This provided adequate hemostasis.  However, the aorta was very fragile and at high risk for tearing and I did not feel comfortable performing the bypass surgery with  crossclamp and decided to proceed with hypothermic circulatory arrest.  The patient was then cooled down to 22 degrees as the mediastinum was dissected to expose the right atrium, right ventricle, and the posterior left ventricle.  The mammary artery, patent bypass to the LAD was dissected out of the mediastinum, identified, and protected, and encircled with a vessel loop.  The patient was prepared for circulatory arrest by the Anesthesia team and the head was packed in ice.  The patient was drained of blood back to the pump reservoir and antegrade cerebral perfusion was started by a clamp placed on the innominate artery in the superior mediastinum.  Once hypothermic circulatory arrest was initiated, the ventricle was totally mobilized.  The posterior descending was identified distal to previous vein graft.  It was a 1.5 mm vessel.  A reversed saphenous vein was sewn end-to-side with running 7-0 Prolene with good flow through the graft.  Cardioplegia was delivered through the vein graft.  Next, the heart was mobilized to expose the circumflex marginal.  This was a difficult vessel to identify because of the lack of mobility of the redo heart anatomy.  The OM was dissected and identified.  It was a 1.5 mm vessel.  A reversed saphenous vein was sewn end-to-side with running 7-0 Prolene with good flow through the graft and cardioplegia was dosed both through both vein grafts.  Next, attention was directed to the ascending aorta.  It was heavily diseased, had been recently repaired, although at high risk for further bleeding and tearing.  We proceeded with excision of the ascending aorta from the innominate artery down to the sinotubular junction.  The aortic valve was inspected and found to have some calcification in the aortic root; however, the leaflets appeared to be intact without significant AI.  A 28 mm platinum Hemashield graft was selected to reconstruct the aorta.  The  proximal suture line was performed with a running 4-0 Prolene with interrupted reinforcing pledgeted Prolene sutures around the entire circumference of the anastomosis.  The graft was then cut to the appropriate length and angle or bevel, and the distal anastomosis to the proximal arch was performed with the same technique using running 4- 0 Prolene circumferentially with interrupted pledgeted horizontal mattress sutures to reinforce the suture lines.  After the graft had been placed, the 2 proximal vein anastomoses were sewn on the graft using a small opening made by the eye cautery and running 6-0 Prolene.  A vent was placed in the ascending aorta as well and clamp was removed off the innominate artery and reperfusion of the body was initiated.  The patient was gradually  rewarmed to normothermia.  The suture line of the ascending aortic graft was intact.  The distal coronary anastomoses and proximal anastomoses were intact and hemostatic.  Temporary pacing wires were applied.  During the reperfusion period, the patient was repaired with blood products including platelets, FFP, and cryoprecipitate for the anticipated severe coagulopathy associated with the significant transfusions required at the time of sternotomy.  When the patient was adequately reperfused and rewarmed, the lungs were expanded.  Ventilator was resumed.  The patient was started on pressors and was weaned off cardiopulmonary bypass using echo guidance.  The LV appeared to be functioning well, but was low volume due to poor RV function.  CVP was elevated and RV function was reduced on direct exam. We then placed the patient back on full bypass and put a balloon pump through the right femoral artery through the previously placed femoral A- line.  We then weaned from cardiopulmonary bypass on balloon pump support as well as inotropic support.  RV function was still significantly abnormal, LV function appeared to be  good, and hemodynamics were improved.  Protamine was administered and the venous cannulas were removed.  The patient had severe coagulopathy with platelet count of 25,000.  The ACT was greater than 1500 while on bypass.  The patient was given blood products including platelets, cryo, and FFP.  This improved coagulation. The graft to the axillary artery was divided and ligated and the incision was closed.  The RV was dilated and it was not safe to close the sternum over the RV, so an Esmarch surgical dressing was sutured to the margins of the sternal wound after the chest tubes had been placed. This was covered with wound VAC.  The patient was then transferred back to the ICU after chest x-ray in the operating room was performed and the balloon pump placement was optimized.  The patient had a paced rhythm with tenuous hemodynamics and significant coagulopathy at the time of transfer back to the ICU.     Ivin Poot, M.D.     PV/MEDQ  D:  04/06/2016  T:  03/22/2016  Job:  [id number]  cc:   Sanda Klein, MD

## 2016-03-29 NOTE — Transfer of Care (Signed)
Immediate Anesthesia Transfer of Care Note  Patient: Douglas Mccoy  Procedure(s) Performed: Procedure(s): STERNAL WOUND IRRIGATION (N/A) WOUND VAC CHANGE (N/A)  Patient Location: SICU  Anesthesia Type:General  Level of Consciousness: unresponsive and Patient remains intubated per anesthesia plan  Airway & Oxygen Therapy: Patient remains intubated per anesthesia plan and Patient placed on Ventilator (see vital sign flow sheet for setting)  Post-op Assessment: Report given to RN and Post -op Vital signs reviewed and stable  Post vital signs: Reviewed and stable  Last Vitals:  Vitals:   03/15/2016 1045 04/10/2016 1100  BP:  114/72  Pulse:  94  Resp: 18 16  Temp: 36.3 C 36.3 C    Last Pain:  Vitals:   04/05/2016 1040  TempSrc: Core (Comment)  PainSc:          Complications: No apparent anesthesia complications

## 2016-03-29 NOTE — Progress Notes (Signed)
Blood products ordered on arrival given emergently via rapid transfusion, see green sheets.

## 2016-03-29 NOTE — Progress Notes (Signed)
1 Day Post-Op Procedure(s) (LRB): REDO CORONARY ARTERY BYPASS GRAFTING x 2 -SVG to OM -SVG to PDA, ENDOSCOPIC HARVEST GREATER SAPHENOUS VEIN -Left Leg AXILLARY CANNULATION, HYPOTHERMIC CIRCULATORY ARREST, REPAIR OF ASCENDING AORTIC INJURY -28 Hemashield Graft, Insertion of Balloon Pump (N/A) TRANSESOPHAGEAL ECHOCARDIOGRAM (TEE) (N/A) Subjective: Sedated on vent Hemodynamics improved - off norepi Coagulopathy/bleeding improved grimaces to stimuli - pupils equal CXR clearing Objective: Vital signs in last 24 hours: Temp:  [95.2 F (35.1 C)-99.1 F (37.3 C)] 97.3 F (36.3 C) (03/16 0700) Pulse Rate:  [82-107] 94 (03/16 0720) Cardiac Rhythm: Atrial paced (03/16 0400) Resp:  [12-31] 16 (03/16 0720) BP: (99-149)/(41-120) 99/67 (03/16 0720) SpO2:  [84 %-100 %] 97 % (03/16 0720) Arterial Line BP: (78-266)/(10-256) 122/30 (03/16 0700) FiO2 (%):  [80 %-100 %] 80 % (03/16 0720) Weight:  [188 lb 0.8 oz (85.3 kg)-191 lb 9.3 oz (86.9 kg)] 191 lb 9.3 oz (86.9 kg) (03/16 0500)  Hemodynamic parameters for last 24 hours: PAP: (28-45)/(19-34) 35/20 CVP:  [11 mmHg-30 mmHg] 15 mmHg CO:  [2.1 L/min-3.5 L/min] 3.5 L/min CI:  [1.2 L/min/m2-2 L/min/m2] 2 L/min/m2  Intake/Output from previous day: 03/15 0701 - 03/16 0700 In: 16227.4 [I.V.:9290.4; TIWPY:0998; IV PJASNKNLZ:7673] Out: 4193 [XTKWI:0973; Emesis/NG output:250; Drains:600; Blood:2400; Chest Tube:1810] Intake/Output this shift: Total I/O In: 161.4 [I.V.:161.4] Out: 55 [Urine:15; Chest Tube:40]  Sedated Total body edema + doppler pulses in both legs A-paced for nsr at 80 Lab Results:  Recent Labs  04/06/2016 0225 03/25/2016 0408  WBC 10.8* 12.2*  HGB 8.5* 10.3*  HCT 25.0* 30.1*  PLT 63* 61*  57*   BMET:  Recent Labs  03/14/2016 0217  03/17/2016 1631 03/24/2016 1809 03/14/2016 0408  NA 135  < > 147* 148* 145  K 4.4  < > 4.1 3.4* 3.9  CL 101  < > 99*  --  108  CO2 24  --   --   --  29  GLUCOSE 252*  < > 170* 169* 142*  BUN 30*  <  > 20  --  22*  CREATININE 1.34*  < > 0.80  --  1.52*  CALCIUM 8.6*  --   --   --  7.6*  < > = values in this interval not displayed.  PT/INR:  Recent Labs  03/31/2016 0408  LABPROT 15.6*  INR 1.23   ABG    Component Value Date/Time   PHART 7.354 04/02/2016 0603   HCO3 27.7 03/23/2016 0603   TCO2 29 03/14/2016 0603   ACIDBASEDEF 7.0 (H) 03/15/2016 1415   O2SAT 96.0 04/09/2016 0603   CBG (last 3)   Recent Labs  03/27/2016 0501 04/13/2016 0601 04/06/2016 0652  GLUCAP 145* 131* 110*    Assessment/Plan: S/P Procedure(s) (LRB): REDO CORONARY ARTERY BYPASS GRAFTING x 2 -SVG to OM -SVG to PDA, ENDOSCOPIC HARVEST GREATER SAPHENOUS VEIN -Left Leg AXILLARY CANNULATION, HYPOTHERMIC CIRCULATORY ARREST, REPAIR OF ASCENDING AORTIC INJURY -28 Hemashield Graft, Insertion of Balloon Pump (N/A) TRANSESOPHAGEAL ECHOCARDIOGRAM (TEE) (N/A) Patients condition d/w sister Humberto Seals. She understands that his neuro status remains uncertain Plan return to OR today for sternal washout and VAC change Needs lasix drip for diuresis before sternal closure next week  transfuse platelets as needed  w/ hx chronic thrombocytopenia/ treated myeloma   LOS: 7 days    Tharon Aquas Trigt III 03/21/2016

## 2016-03-29 NOTE — Progress Notes (Signed)
Dr. Prescott Gum notified of patients facial twitching and downward gaze; instructed to return precedex gtt back up to 0.5.  Will continue to monitor.

## 2016-03-29 NOTE — Anesthesia Preprocedure Evaluation (Addendum)
Anesthesia Evaluation   Patient unresponsive    Reviewed: Allergy & Precautions, NPO status , Patient's Chart, lab work & pertinent test results  History of Anesthesia Complications Negative for: history of anesthetic complications  Airway Mallampati: Intubated       Dental   Pulmonary COPD, former smoker,    + rhonchi    rales    Cardiovascular hypertension, + angina + CAD and + Past MI   Rhythm:Regular Rate:Tachycardia - Systolic murmurs Left ventricle: The cavity size was normal. Wall thickness was   increased in a pattern of mild LVH. Systolic function was normal.   The estimated ejection fraction was in the range of 50% to 55%.   Wall motion was normal; there were no regional wall motion   abnormalities. Doppler parameters are consistent with abnormal   left ventricular relaxation (grade 1 diastolic dysfunction).   Doppler parameters are consistent with high ventricular filling   pressure. - Aortic valve: Valve mobility was restricted. There was mild   regurgitation. - Aortic root: The aortic root was mildly dilated. - Mitral valve: Calcified annulus. There was mild regurgitation. - Left atrium: The atrium was mildly dilated. - Atrial septum: There was an atrial septal aneurysm.  Impressions:  - Low normal LV systolic function; mild LVH; grade 1 diastolic   dysfunction; elevated LV filling pressure; calcified aortic valve   with mild AI and no AS by doppler; mild MR; mild LAE.    Neuro/Psych PSYCHIATRIC DISORDERS Anxiety Depression negative neurological ROS     GI/Hepatic Neg liver ROS, hiatal hernia, GERD  ,  Endo/Other  diabetes  Renal/GU Renal disease  negative genitourinary   Musculoskeletal negative musculoskeletal ROS (+)   Abdominal   Peds negative pediatric ROS (+)  Hematology  (+) anemia , thrombocytopenia   Anesthesia Other Findings   Reproductive/Obstetrics negative OB ROS                             Anesthesia Physical  Anesthesia Plan  ASA: IV  Anesthesia Plan: General   Post-op Pain Management:    Induction: Intravenous  Airway Management Planned: Oral ETT  Additional Equipment:   Intra-op Plan:   Post-operative Plan: Post-operative intubation/ventilation  Informed Consent: I have reviewed the patients History and Physical, chart, labs and discussed the procedure including the risks, benefits and alternatives for the proposed anesthesia with the patient or authorized representative who has indicated his/her understanding and acceptance.     Plan Discussed with: Surgeon, CRNA and Anesthesiologist  Anesthesia Plan Comments:        Anesthesia Quick Evaluation

## 2016-03-29 NOTE — Brief Op Note (Signed)
03/24/2016 - 03/20/2016  1:43 PM  PATIENT:  Douglas Mccoy  71 y.o. male  PRE-OPERATIVE DIAGNOSIS:  WASHOUT  POST-OPERATIVE DIAGNOSIS:  WASHOUT  PROCEDURE:  Procedure(s): STERNAL WOUND IRRIGATION (N/A) WOUND VAC CHANGE (N/A)  SURGEON:  Surgeon(s) and Role:    * Ivin Poot, MD - Primary  PHYSICIAN ASSISTANT:   ASSISTANTS: none   ANESTHESIA:   general  EBL:  Total I/O In: 1690.6 [I.V.:1003.6; Blood:532; IV Piggyback:155] Out: 425 [Urine:155; Blood:150; Chest Tube:120]  BLOOD ADMINISTERED:none  DRAINS: 1 Chest Tube(s) in the right pleura   LOCAL MEDICATIONS USED:  NONE  SPECIMEN:  2  Chest Tube(s) in the mediastinum  DISPOSITION OF SPECIMEN:  N/A  COUNTS:  YES  TOURNIQUET:  * No tourniquets in log *  DICTATION: .Dragon Dictation  PLAN OF CARE: return to  SICU  PATIENT DISPOSITION:  ICU - intubated and critically ill.   Delay start of Pharmacological VTE agent (>24hrs) due to surgical blood loss or risk of bleeding: yes

## 2016-03-29 NOTE — Progress Notes (Signed)
The patient was examined and preop studies reviewed. There has been no change from the prior exam and the patient is ready for surgery. Plan wound irrigation and VAC change out on Douglas Mccoy - consent received from patients [place]

## 2016-03-29 NOTE — Plan of Care (Signed)
Problem: Cardiovascular: Goal: Vascular access site(s) Level 0-1 will be maintained Outcome: Not Met (add Reason) Vascular sites Level 2 d/t coagulopathy.

## 2016-03-29 NOTE — Anesthesia Postprocedure Evaluation (Addendum)
Anesthesia Post Note  Patient: Douglas Mccoy  Procedure(s) Performed: Procedure(s) (LRB): STERNAL WOUND IRRIGATION (N/A) WOUND VAC CHANGE (N/A)  Patient location during evaluation: SICU Anesthesia Type: General Level of consciousness: sedated Pain management: pain level controlled Vital Signs Assessment: post-procedure vital signs reviewed and stable Respiratory status: patient remains intubated per anesthesia plan Cardiovascular status: stable Anesthetic complications: no       Last Vitals:  Vitals:   03/14/2016 1200 03/28/2016 1524  BP:  119/77  Pulse: 94 94  Resp: 13 14  Temp:      Last Pain:  Vitals:   04/12/2016 1040  TempSrc: Core (Comment)  PainSc:                  Horice Carrero DANIEL

## 2016-03-30 ENCOUNTER — Inpatient Hospital Stay (HOSPITAL_COMMUNITY): Payer: Medicare Other

## 2016-03-30 ENCOUNTER — Encounter (HOSPITAL_COMMUNITY): Payer: Self-pay | Admitting: Certified Registered Nurse Anesthetist

## 2016-03-30 LAB — BPAM RBC
Blood Product Expiration Date: 201803212359
Blood Product Expiration Date: 201804062359
Blood Product Expiration Date: 201804062359
Blood Product Expiration Date: 201804062359
Blood Product Expiration Date: 201804062359
Blood Product Expiration Date: 201804082359
Blood Product Expiration Date: 201804082359
Blood Product Expiration Date: 201804082359
Blood Product Expiration Date: 201804082359
Blood Product Expiration Date: 201804082359
Blood Product Expiration Date: 201804082359
Blood Product Expiration Date: 201804082359
Blood Product Expiration Date: 201804082359
Blood Product Expiration Date: 201804082359
Blood Product Expiration Date: 201804092359
Blood Product Expiration Date: 201804092359
Blood Product Expiration Date: 201804092359
Blood Product Expiration Date: 201804092359
Blood Product Expiration Date: 201804102359
Blood Product Expiration Date: 201804102359
Blood Product Expiration Date: 201804102359
Blood Product Expiration Date: 201804112359
Blood Product Expiration Date: 201804112359
Blood Product Expiration Date: 201804112359
Blood Product Expiration Date: 201804112359
Blood Product Expiration Date: 201804112359
Blood Product Expiration Date: 201804112359
Blood Product Expiration Date: 201804112359
Blood Product Expiration Date: 201804122359
Blood Product Expiration Date: 201804122359
Blood Product Expiration Date: 201804122359
Blood Product Expiration Date: 201804122359
Blood Product Expiration Date: 201804122359
ISSUE DATE / TIME: 201803141626
ISSUE DATE / TIME: 201803150724
ISSUE DATE / TIME: 201803150724
ISSUE DATE / TIME: 201803150724
ISSUE DATE / TIME: 201803150724
ISSUE DATE / TIME: 201803150956
ISSUE DATE / TIME: 201803150956
ISSUE DATE / TIME: 201803150956
ISSUE DATE / TIME: 201803150956
ISSUE DATE / TIME: 201803151007
ISSUE DATE / TIME: 201803151007
ISSUE DATE / TIME: 201803151007
ISSUE DATE / TIME: 201803151007
ISSUE DATE / TIME: 201803151019
ISSUE DATE / TIME: 201803151019
ISSUE DATE / TIME: 201803151830
ISSUE DATE / TIME: 201803151903
ISSUE DATE / TIME: 201803152029
ISSUE DATE / TIME: 201803160145
ISSUE DATE / TIME: 201803161053
ISSUE DATE / TIME: 201803161133
ISSUE DATE / TIME: 201803161133
ISSUE DATE / TIME: 201803161203
ISSUE DATE / TIME: 201803161203
ISSUE DATE / TIME: 201803161220
ISSUE DATE / TIME: 201803161220
ISSUE DATE / TIME: 201803161220
ISSUE DATE / TIME: 201803161220
Unit Type and Rh: 6200
Unit Type and Rh: 6200
Unit Type and Rh: 6200
Unit Type and Rh: 6200
Unit Type and Rh: 6200
Unit Type and Rh: 6200
Unit Type and Rh: 6200
Unit Type and Rh: 6200
Unit Type and Rh: 6200
Unit Type and Rh: 6200
Unit Type and Rh: 6200
Unit Type and Rh: 6200
Unit Type and Rh: 6200
Unit Type and Rh: 6200
Unit Type and Rh: 6200
Unit Type and Rh: 6200
Unit Type and Rh: 6200
Unit Type and Rh: 6200
Unit Type and Rh: 6200
Unit Type and Rh: 6200
Unit Type and Rh: 6200
Unit Type and Rh: 6200
Unit Type and Rh: 6200
Unit Type and Rh: 6200
Unit Type and Rh: 6200
Unit Type and Rh: 6200
Unit Type and Rh: 6200
Unit Type and Rh: 6200
Unit Type and Rh: 6200
Unit Type and Rh: 6200
Unit Type and Rh: 6200
Unit Type and Rh: 6200
Unit Type and Rh: 6200

## 2016-03-30 LAB — COMPREHENSIVE METABOLIC PANEL
ALT: 67 U/L — ABNORMAL HIGH (ref 17–63)
AST: 151 U/L — ABNORMAL HIGH (ref 15–41)
Albumin: 2.4 g/dL — ABNORMAL LOW (ref 3.5–5.0)
Alkaline Phosphatase: 38 U/L (ref 38–126)
Anion gap: 12 (ref 5–15)
BUN: 26 mg/dL — ABNORMAL HIGH (ref 6–20)
CO2: 23 mmol/L (ref 22–32)
Calcium: 7.2 mg/dL — ABNORMAL LOW (ref 8.9–10.3)
Chloride: 105 mmol/L (ref 101–111)
Creatinine, Ser: 2.5 mg/dL — ABNORMAL HIGH (ref 0.61–1.24)
GFR calc Af Amer: 28 mL/min — ABNORMAL LOW (ref >60)
GFR calc non Af Amer: 24 mL/min — ABNORMAL LOW (ref >60)
Glucose, Bld: 175 mg/dL — ABNORMAL HIGH (ref 65–99)
Potassium: 4.1 mmol/L (ref 3.5–5.1)
Sodium: 140 mmol/L (ref 135–145)
Total Bilirubin: 1.6 mg/dL — ABNORMAL HIGH (ref 0.3–1.2)
Total Protein: 4.1 g/dL — ABNORMAL LOW (ref 6.5–8.1)

## 2016-03-30 LAB — COOXEMETRY PANEL
Carboxyhemoglobin: 0.8 % (ref 0.5–1.5)
Carboxyhemoglobin: 0.7 % (ref 0.5–1.5)
Methemoglobin: 1.5 % (ref 0.0–1.5)
Methemoglobin: 1.2 % (ref 0.0–1.5)
O2 Saturation: 50 %
O2 Saturation: 46.5 %
Total hemoglobin: 9.5 g/dL — ABNORMAL LOW (ref 12.0–16.0)
Total hemoglobin: 14.1 g/dL (ref 12.0–16.0)

## 2016-03-30 LAB — GLUCOSE, CAPILLARY
GLUCOSE-CAPILLARY: 178 mg/dL — AB (ref 65–99)
Glucose-Capillary: 104 mg/dL — ABNORMAL HIGH (ref 65–99)
Glucose-Capillary: 119 mg/dL — ABNORMAL HIGH (ref 65–99)
Glucose-Capillary: 141 mg/dL — ABNORMAL HIGH (ref 65–99)
Glucose-Capillary: 148 mg/dL — ABNORMAL HIGH (ref 65–99)
Glucose-Capillary: 162 mg/dL — ABNORMAL HIGH (ref 65–99)
Glucose-Capillary: 180 mg/dL — ABNORMAL HIGH (ref 65–99)
Glucose-Capillary: 193 mg/dL — ABNORMAL HIGH (ref 65–99)

## 2016-03-30 LAB — TYPE AND SCREEN
ABO/RH(D): A POS
Antibody Screen: NEGATIVE
Unit division: 0
Unit division: 0
Unit division: 0
Unit division: 0
Unit division: 0
Unit division: 0
Unit division: 0
Unit division: 0
Unit division: 0
Unit division: 0
Unit division: 0
Unit division: 0
Unit division: 0
Unit division: 0
Unit division: 0
Unit division: 0
Unit division: 0
Unit division: 0
Unit division: 0
Unit division: 0
Unit division: 0
Unit division: 0
Unit division: 0
Unit division: 0
Unit division: 0
Unit division: 0
Unit division: 0
Unit division: 0
Unit division: 0
Unit division: 0
Unit division: 0
Unit division: 0
Unit division: 0

## 2016-03-30 LAB — PREPARE PLATELET PHERESIS: Unit division: 0

## 2016-03-30 LAB — POCT I-STAT 3, ART BLOOD GAS (G3+)
ACID-BASE DEFICIT: 3 mmol/L — AB (ref 0.0–2.0)
ACID-BASE DEFICIT: 3 mmol/L — AB (ref 0.0–2.0)
BICARBONATE: 24.1 mmol/L (ref 20.0–28.0)
BICARBONATE: 23.4 mmol/L (ref 20.0–28.0)
Bicarbonate: 21.4 mmol/L (ref 20.0–28.0)
O2 SAT: 95 %
O2 SAT: 83 %
O2 Saturation: 96 %
PH ART: 7.421 (ref 7.350–7.450)
PH ART: 7.281 — AB (ref 7.350–7.450)
PO2 ART: 77 mmHg — AB (ref 83.0–108.0)
PO2 ART: 53 mmHg — AB (ref 83.0–108.0)
Patient temperature: 36.8
TCO2: 25 mmol/L (ref 0–100)
TCO2: 22 mmol/L (ref 0–100)
TCO2: 25 mmol/L (ref 0–100)
pCO2 arterial: 37.3 mmHg (ref 32.0–48.0)
pCO2 arterial: 35.4 mmHg (ref 32.0–48.0)
pCO2 arterial: 49.5 mmHg — ABNORMAL HIGH (ref 32.0–48.0)
pH, Arterial: 7.392 (ref 7.350–7.450)
pO2, Arterial: 85 mmHg (ref 83.0–108.0)

## 2016-03-30 LAB — POCT I-STAT 7, (LYTES, BLD GAS, ICA,H+H)
Acid-Base Excess: 1 mmol/L (ref 0.0–2.0)
Bicarbonate: 26.2 mmol/L (ref 20.0–28.0)
CALCIUM ION: 1.04 mmol/L — AB (ref 1.15–1.40)
HEMATOCRIT: 28 % — AB (ref 39.0–52.0)
HEMOGLOBIN: 9.5 g/dL — AB (ref 13.0–17.0)
O2 SAT: 96 %
PCO2 ART: 42.3 mmHg (ref 32.0–48.0)
POTASSIUM: 3.9 mmol/L (ref 3.5–5.1)
SODIUM: 144 mmol/L (ref 135–145)
TCO2: 27 mmol/L (ref 0–100)
pH, Arterial: 7.401 (ref 7.350–7.450)
pO2, Arterial: 79 mmHg — ABNORMAL LOW (ref 83.0–108.0)

## 2016-03-30 LAB — POCT I-STAT, CHEM 8
BUN: 27 mg/dL — ABNORMAL HIGH (ref 6–20)
BUN: 28 mg/dL — AB (ref 6–20)
CALCIUM ION: 0.94 mmol/L — AB (ref 1.15–1.40)
CALCIUM ION: 0.97 mmol/L — AB (ref 1.15–1.40)
CHLORIDE: 97 mmol/L — AB (ref 101–111)
CREATININE: 2.6 mg/dL — AB (ref 0.61–1.24)
Chloride: 98 mmol/L — ABNORMAL LOW (ref 101–111)
Creatinine, Ser: 2.6 mg/dL — ABNORMAL HIGH (ref 0.61–1.24)
GLUCOSE: 198 mg/dL — AB (ref 65–99)
GLUCOSE: 210 mg/dL — AB (ref 65–99)
HCT: 26 % — ABNORMAL LOW (ref 39.0–52.0)
HCT: 25 % — ABNORMAL LOW (ref 39.0–52.0)
HEMOGLOBIN: 8.8 g/dL — AB (ref 13.0–17.0)
HEMOGLOBIN: 8.5 g/dL — AB (ref 13.0–17.0)
Potassium: 4 mmol/L (ref 3.5–5.1)
Potassium: 3.9 mmol/L (ref 3.5–5.1)
SODIUM: 139 mmol/L (ref 135–145)
Sodium: 137 mmol/L (ref 135–145)
TCO2: 22 mmol/L (ref 0–100)
TCO2: 23 mmol/L (ref 0–100)

## 2016-03-30 LAB — POCT I-STAT 4, (NA,K, GLUC, HGB,HCT)
Glucose, Bld: 113 mg/dL — ABNORMAL HIGH (ref 65–99)
HEMATOCRIT: 27 % — AB (ref 39.0–52.0)
Hemoglobin: 9.2 g/dL — ABNORMAL LOW (ref 13.0–17.0)
Potassium: 4 mmol/L (ref 3.5–5.1)
SODIUM: 144 mmol/L (ref 135–145)

## 2016-03-30 LAB — CBC
HCT: 27.6 % — ABNORMAL LOW (ref 39.0–52.0)
Hemoglobin: 9.6 g/dL — ABNORMAL LOW (ref 13.0–17.0)
MCH: 28.8 pg (ref 26.0–34.0)
MCHC: 34.8 g/dL (ref 30.0–36.0)
MCV: 82.9 fL (ref 78.0–100.0)
Platelets: 40 10*3/uL — ABNORMAL LOW (ref 150–400)
RBC: 3.33 MIL/uL — ABNORMAL LOW (ref 4.22–5.81)
RDW: 16.2 % — ABNORMAL HIGH (ref 11.5–15.5)
WBC: 7.4 10*3/uL (ref 4.0–10.5)

## 2016-03-30 LAB — BPAM PLATELET PHERESIS
Blood Product Expiration Date: 201803162359
ISSUE DATE / TIME: 201803160943
Unit Type and Rh: 6200

## 2016-03-30 LAB — MAGNESIUM: Magnesium: 1.1 mg/dL — ABNORMAL LOW (ref 1.7–2.4)

## 2016-03-30 LAB — PREPARE RBC (CROSSMATCH)

## 2016-03-30 LAB — TRIGLYCERIDES: Triglycerides: 97 mg/dL (ref <150)

## 2016-03-30 MED ORDER — VALPROATE SODIUM 500 MG/5ML IV SOLN
500.0000 mg | Freq: Three times a day (TID) | INTRAVENOUS | Status: DC
  Administered 2016-03-31 – 2016-04-03 (×10): 500 mg via INTRAVENOUS
  Filled 2016-03-30 (×12): qty 5

## 2016-03-30 MED ORDER — SODIUM CHLORIDE 0.9 % IV SOLN
Freq: Once | INTRAVENOUS | Status: AC
  Administered 2016-03-30: 11:00:00 via INTRAVENOUS

## 2016-03-30 MED ORDER — SODIUM CHLORIDE 0.9 % IV SOLN: 50.0000 mg/h | INTRAVENOUS | Status: DC

## 2016-03-30 MED ORDER — LORAZEPAM 2 MG/ML IJ SOLN
2.0000 mg | Freq: Once | INTRAMUSCULAR | Status: AC
  Administered 2016-03-30: 2 mg via INTRAVENOUS
  Filled 2016-03-30: qty 1

## 2016-03-30 MED ORDER — MIDAZOLAM BOLUS VIA INFUSION
20.0000 mg | INTRAVENOUS | Status: AC
  Administered 2016-03-30 (×4): 20 mg via INTRAVENOUS
  Filled 2016-03-30 (×4): qty 20

## 2016-03-30 MED ORDER — SODIUM CHLORIDE 0.9 % IV SOLN: Freq: Once | INTRAVENOUS | Status: AC

## 2016-03-30 MED ORDER — ORAL CARE MOUTH RINSE
15.0000 mL | Freq: Four times a day (QID) | OROMUCOSAL | Status: DC
  Administered 2016-03-30 – 2016-04-03 (×17): 15 mL via OROMUCOSAL

## 2016-03-30 MED ORDER — NOREPINEPHRINE BITARTRATE 1 MG/ML IV SOLN
0.0000 ug/min | INTRAVENOUS | Status: DC
  Administered 2016-03-30: 10 ug/min via INTRAVENOUS
  Administered 2016-03-31: 24 ug/min via INTRAVENOUS
  Filled 2016-03-30 (×4): qty 16

## 2016-03-30 MED ORDER — AMIODARONE HCL IN DEXTROSE 360-4.14 MG/200ML-% IV SOLN
30.0000 mg/h | INTRAVENOUS | Status: DC
  Administered 2016-03-31 – 2016-04-01 (×3): 30 mg/h via INTRAVENOUS
  Filled 2016-03-30 (×4): qty 200

## 2016-03-30 MED ORDER — PROPOFOL 1000 MG/100ML IV EMUL
INTRAVENOUS | Status: AC
  Filled 2016-03-30: qty 100

## 2016-03-30 MED ORDER — LORAZEPAM 2 MG/ML IJ SOLN
1.0000 mg | INTRAMUSCULAR | Status: DC | PRN
  Administered 2016-03-30: 1 mg via INTRAVENOUS
  Filled 2016-03-30: qty 1

## 2016-03-30 MED ORDER — SODIUM CHLORIDE 0.9 % IV SOLN
50.0000 mg/h | INTRAVENOUS | Status: DC
  Administered 2016-03-30: 50 mg/h via INTRAVENOUS
  Administered 2016-03-30 (×2): 15 mg/h via INTRAVENOUS
  Filled 2016-03-30 (×7): qty 10

## 2016-03-30 MED ORDER — AMIODARONE HCL IN DEXTROSE 360-4.14 MG/200ML-% IV SOLN
60.0000 mg/h | INTRAVENOUS | Status: AC
  Administered 2016-03-30 (×2): 60 mg/h via INTRAVENOUS
  Filled 2016-03-30: qty 200

## 2016-03-30 MED ORDER — SODIUM CHLORIDE 0.9 % IV SOLN: 1000.0000 mg | INTRAVENOUS | Status: DC

## 2016-03-30 MED ORDER — SODIUM CHLORIDE 0.9 % IV SOLN
1500.0000 mg | INTRAVENOUS | Status: DC
  Administered 2016-03-31 – 2016-04-03 (×3): 1500 mg via INTRAVENOUS
  Filled 2016-03-30 (×4): qty 15

## 2016-03-30 MED ORDER — AMIODARONE HCL IN DEXTROSE 360-4.14 MG/200ML-% IV SOLN
INTRAVENOUS | Status: AC
  Filled 2016-03-30: qty 200

## 2016-03-30 MED ORDER — SODIUM CHLORIDE 0.9 % IV SOLN
15.0000 mg/h | INTRAVENOUS | Status: DC
  Administered 2016-03-30 (×2): 50 mg/h via INTRAVENOUS
  Administered 2016-03-31: 20 mg/h via INTRAVENOUS
  Administered 2016-03-31: 30 mg/h via INTRAVENOUS
  Filled 2016-03-30 (×4): qty 50

## 2016-03-30 MED ORDER — PROPOFOL 1000 MG/100ML IV EMUL
5.0000 ug/kg/min | INTRAVENOUS | Status: DC
  Administered 2016-03-30 – 2016-03-31 (×4): 20 ug/kg/min via INTRAVENOUS
  Administered 2016-04-01: 10 ug/kg/min via INTRAVENOUS
  Administered 2016-04-01: 20 ug/kg/min via INTRAVENOUS
  Filled 2016-03-30 (×5): qty 100

## 2016-03-30 MED ORDER — SODIUM CHLORIDE 0.9 % IV SOLN
1500.0000 mg | Freq: Once | INTRAVENOUS | Status: AC
  Administered 2016-03-30: 1500 mg via INTRAVENOUS
  Filled 2016-03-30: qty 15

## 2016-03-30 MED ORDER — SODIUM CHLORIDE 0.9 % IV SOLN
200.0000 mg | Freq: Two times a day (BID) | INTRAVENOUS | Status: DC
Start: 2016-03-30 — End: 2016-04-02
  Administered 2016-03-30 – 2016-03-31 (×4): 200 mg via INTRAVENOUS
  Filled 2016-03-30 (×9): qty 20

## 2016-03-30 MED ORDER — VALPROATE SODIUM 500 MG/5ML IV SOLN
2000.0000 mg | Freq: Once | INTRAVENOUS | Status: AC
  Administered 2016-03-30: 2000 mg via INTRAVENOUS
  Filled 2016-03-30: qty 20

## 2016-03-30 NOTE — Progress Notes (Addendum)
Patient still with frequent periodic discharges on EEG, with occasional periods of clear seizure. 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 He has received bolus dosing of midazolam with continued frequent discharges. I will start propofol in addition with the goal of suppressing these discharges for the time being while optimizing AED therapy. Marland Kitchen   1) continue keppra 1500mg  BID 2) continue vimpat 200mg  BID 3) start depacon 2g x 1, then 500mg  TID 4) continue midazolam 5) start propofol, titrated to suppression of frequent epileptiform discharges.   This patient is critically ill and at significant risk of neurological worsening, death and care requires constant monitoring of vital signs, hemodynamics,respiratory and cardiac monitoring, neurological assessment, discussion with family, other specialists and medical decision making of high complexity. I spent 30 minutes of neurocritical care time  in the care of  this patient.  Roland Rack, MD [facility] [phone]  If 7pm- 7am, please page neurology on call as listed in [place]. 03/30/2016  9:32 PM

## 2016-03-30 NOTE — Consult Note (Signed)
Neurology Consultation Reason for Consult: twitching Referring Physician: CV attending  CC: twitching  History is obtained from: chart  HPI: Douglas Mccoy is a 71 y.o. male with a history of CAD s/p CABG x 4 and R CEA in 2004, Hypertension, hyperlipidemia, COPD, diabetes, chronic kidney disease, multiple myeloma and recent multiple ER visits/admission for hypotension presented for repeated syncope and was admitted for CABG re-do.  During surgery found to have severe coagulopathy and bleeding and following correction, post-op, pt began to have twitching of the face that progressed and today is much more violent.  I am consulted.    ROS: Unable to obtain as pt intubated and sedated  Past Medical History:  Diagnosis Date  . Anemia   . Anxiety   . Arthritis    "touch in my hands" (01/12/2016)  . Bradycardia   . CAD (coronary artery disease)   . Chronic back pain    "upper and lower" (01/12/2016)  . Chronic bronchitis ([place])   . Chronic kidney disease    "I'm on a renal diet" (01/12/2016)  . COPD (chronic obstructive pulmonary disease) ([place])   . Depression   . GERD (gastroesophageal reflux disease)   . Heart murmur    "when I was little"  . History of fractured vertebra    "between shoulder blades"  . History of hiatal hernia   . History of stem cell transplant ([place])   . Hyperlipidemia   . Hypertension   . Hypotension   . Hypothyroidism   . Multiple myeloma ([place]) 2013   treated w/intensive chemo & bone marrow transplant per MD note  . Myocardial infarction    "I've had 2" (01/12/2016)  . Seizures ([place]) 2017   "when my potassium got too high" (01/12/2016)  . Shingles   . Sleep apnea    "I don't have a mask right now" (01/12/2016)  . Stroke ([place])    "I've had 2; fingers stay colder" (pointes to left hand; 3rd, 4th, and 5th digits" (01/12/2016)  . Type II diabetes mellitus (H[facility])     Family History  Problem Relation Age of Onset  . Diabetes Mother   . Cancer Neg Hx   .  Stroke Neg Hx     Social History:  reports that he quit smoking about 27 years ago. His smoking use included Cigarettes. He has a 70.00 pack-year smoking history. He has never used smokeless tobacco. He reports that he does not drink alcohol or use drugs.  Exam: Current vital signs: BP (!) 93/54   Pulse 93   Temp 99.5 F (37.5 C)   Resp (!) 27   Ht 5' 5"  (1.651 m)   Wt 87.6 kg (193 lb 2 oz)   SpO2 95%   BMI 32.14 kg/m  Vital signs in last 24 hours: Temp:  [96.3 F (35.7 C)-100.6 F (38.1 C)] 99.5 F (37.5 C) (03/17 0845) Pulse Rate:  [93-94] 93 (03/17 0734) Resp:  [0-32] 27 (03/17 0845) BP: (87-157)/(43-77) 93/54 (03/17 0800) SpO2:  [87 %-99 %] 95 % (03/17 0845) Arterial Line BP: (83-209)/(27-59) 133/38 (03/17 0845) FiO2 (%):  [80 %-100 %] 100 % (03/17 0748) Weight:  [87.6 kg (193 lb 2 oz)] 87.6 kg (193 lb 2 oz) (03/17 0500)   Physical Exam  Constitutional: Appears well-developed and well-nourished.  Psych: Affect appropriate to situation Eyes: No scleral injection HENT: No OP obstrucion Head: Normocephalic.  Cardiovascular: Normal rate and regular rhythm.  Respiratory: Effort normal and breath sounds normal to anterior ascultation GI:  Soft.  No distension. There is no tenderness.  Skin: WDI  Neuro: Mental Status: Intubated and sedated. Does not follow commands.  He does not orient to me and has no response to pain.  There is vigorous twitching of the left hay and left gaze deviation and blinking. Cranial Nerves: II: No BTT.  Pupils are equal, round, and sluggishly reatice to light.  III,IV, VI: no corneal reflexes V: no corneals No cough or gag and no OCR Motor: Flaccid. 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  Burtis Junes the former.  STAT Load with IV keppra and increase dose to 1000q12.  STAT Hook up to cEEG.  After EEG  will follow and treat as appropriate

## 2016-03-30 NOTE — Progress Notes (Signed)
Amiodarone Drug - Drug Interaction Consult Note  Recommendations: Monitor for muscle pain, weakness on atorvastatin, monitor hypokalemia on Lasix drip. No major issues noted.  Amiodarone is metabolized by the cytochrome P450 system and therefore has the potential to cause many drug interactions. Amiodarone has an average plasma half-life of 50 days (range 20 to 100 days).   There is potential for drug interactions to occur several weeks or months after stopping treatment and the onset of drug interactions may be slow after initiating amiodarone.   [x]  Statins: Increased risk of myopathy. Simvastatin- restrict dose to 20mg   daily. Other statins: counsel patients to report any muscle pain or weakness immediately. *Atorvastatin  []  Anticoagulants: Amiodarone can increase anticoagulant effect. Consider warfarin dose reduction. Patients should be monitored closely and the dose of anticoagulant altered accordingly, remembering that amiodarone levels take several weeks to stabilize.  []  Antiepileptics: Amiodarone can increase plasma concentration of phenytoin, the dose should be reduced. Note that small changes in phenytoin dose can result in large changes in levels. Monitor patient and counsel on signs of toxicity.  []  Beta blockers: increased risk of bradycardia, AV block and myocardial depression. Sotalol - avoid concomitant use.  []   Calcium channel blockers (diltiazem and verapamil): increased risk of bradycardia, AV block and myocardial depression.  []   Cyclosporine: Amiodarone increases levels of cyclosporine. Reduced dose of cyclosporine is recommended.  []  Digoxin dose should be halved when amiodarone is started.  [x]  Diuretics: increased risk of cardiotoxicity if hypokalemia occurs. *Lasix  []  Oral hypoglycemic agents (glyburide, glipizide, glimepiride): increased risk of hypoglycemia. Patient's glucose levels should be monitored closely when initiating amiodarone therapy.   []  Drugs  that prolong the QT interval:  Torsades de pointes risk may be increased with concurrent use - avoid if possible.  Monitor QTc, also keep magnesium/potassium WNL if concurrent therapy can't be avoided. Marland Kitchen Antibiotics: e.g. fluoroquinolones, erythromycin. . Antiarrhythmics: e.g. quinidine, procainamide, disopyramide, sotalol. . Antipsychotics: e.g. phenothiazines, haloperidol.  . Lithium, tricyclic antidepressants, and methadone. Thank You,   Elicia Lamp, PharmD, BCPS Clinical Pharmacist 03/30/2016 10:28 AM

## 2016-03-30 NOTE — Progress Notes (Signed)
vEEG LTM running. No skin breakdown. Notified Neuro

## 2016-03-30 NOTE — Progress Notes (Signed)
1 Day Post-Op Procedure(s) (LRB): STERNAL WOUND IRRIGATION (N/A) WOUND VAC CHANGE (N/A) Subjective: Intubated sedated No response to pain. Some spontaneous chewing motion  Objective: Vital signs in last 24 hours: Temp:  [96.3 F (35.7 C)-100.6 F (38.1 C)] 99.5 F (37.5 C) (03/17 0845) Pulse Rate:  [93-94] 93 (03/17 0734) Cardiac Rhythm: Atrial fibrillation (03/17 0800) Resp:  [0-32] 27 (03/17 0845) BP: (87-157)/(43-77) 93/54 (03/17 0800) SpO2:  [87 %-99 %] 95 % (03/17 0845) Arterial Line BP: (83-209)/(27-59) 133/38 (03/17 0845) FiO2 (%):  [80 %-100 %] 100 % (03/17 0748) Weight:  [193 lb 2 oz (87.6 kg)] 193 lb 2 oz (87.6 kg) (03/17 0500)  Hemodynamic parameters for last 24 hours: PAP: (34-54)/(16-27) 43/19 CVP:  [9 mmHg-33 mmHg] 11 mmHg CO:  [3.9 L/min-4.9 L/min] 4.5 L/min CI:  [2.3 L/min/m2-2.9 L/min/m2] 2.6 L/min/m2  Intake/Output from previous day: 03/16 0701 - 03/17 0700 In: 3855.2 [I.V.:2948.2; Blood:532; NG/GT:120; IV Piggyback:255] Out: 2993 [Urine:2215; Drains:25; Blood:150; Chest Tube:1060] Intake/Output this shift: Total I/O In: 110.6 [I.V.:110.6] Out: 360 [Urine:260; Chest Tube:100]  General appearance: sedated no response to pain Neurologic: see above Heart: regular rate and rhythm Lungs: rhonchi bilaterally Wound: VAC in place  Lab Results:  Recent Labs  04/06/2016 1328  04/13/2016 1613 03/30/16 0356  WBC 14.1*  --   --  7.4  HGB 10.0*  < > 9.9* 9.6*  HCT 29.8*  < > 29.0* 27.6*  PLT 76*  --   --  40*  < > = values in this interval not displayed. BMET:  Recent Labs  03/30/2016 0408  03/28/2016 1613 03/30/16 0356  NA 145  < > 144 140  K 3.9  < > 4.1 4.1  CL 108  --  101 105  CO2 29  --   --  23  GLUCOSE 142*  < > 84 175*  BUN 22*  --  26* 26*  CREATININE 1.52*  --  2.10* 2.50*  CALCIUM 7.6*  --   --  7.2*  < > = values in this interval not displayed.  PT/INR:  Recent Labs  03/15/2016 0408  LABPROT 15.6*  INR 1.23   ABG    Component Value  Date/Time   PHART 7.392 03/30/2016 0359   HCO3 21.4 03/30/2016 0359   TCO2 22 03/30/2016 0359   ACIDBASEDEF 3.0 (H) 03/30/2016 0359   O2SAT 50.0 03/30/2016 0400   CBG (last 3)   Recent Labs  03/30/16 0000 03/30/16 0358 03/30/16 0805  GLUCAP 193* 162* 141*    Assessment/Plan: S/P Procedure(s) (LRB): STERNAL WOUND IRRIGATION (N/A) WOUND VAC CHANGE (N/A) -Remains critically ill NEURO- status uncertain, prognosis poor  ? If having some mild seizure activity- will add ativan, on Keppra CV- good index on IABP, milrinone and epi, but co-ox low at 50  Intermittent brief runs of a fib- start amiodarone RESP- VDRF,  RENAL- UOP picked up 2nd shift yesterday- creatinine up slightly ENDO- CBG moderately elevated Thrombocytopenia- PLT down to 40K and increased bloody drainage from CT- will give platelets this AM Critically ill with multiple organ system failure- prognosis grim    LOS: 8 days    Melrose Nakayama 03/30/2016

## 2016-03-30 NOTE — Progress Notes (Signed)
      [address]       [place] [address]             [phone]      Intubated, sedated  BP 105/71   Pulse 73   Temp 98.4 F (36.9 C) (Core (Comment))   Resp (!) 25   Ht 5\' 5"  (1.651 m)   Wt 193 lb 2 oz (87.6 kg)   SpO2 92%   BMI 32.14 kg/m  CI= 3.66 PA 40/21   Intake/Output Summary (Last 24 hours) at 03/30/16 1721 Last data filed at 03/30/16 1700  Gross per 24 hour  Intake          3461.78 ml  Output             3955 ml  Net          -493.22 ml   Appreciate Neuro input- status epilepticus v anoxic myoclonus. On Keppra. No activity at present  Co-ox only 46 despite good Co Will transfuse when he needs volume  Remo Lipps C. Roxan Hockey, MD [facility] [phone]

## 2016-03-31 ENCOUNTER — Inpatient Hospital Stay (HOSPITAL_COMMUNITY): Payer: Medicare Other

## 2016-03-31 LAB — POCT I-STAT 3, ART BLOOD GAS (G3+)
Acid-base deficit: 1 mmol/L (ref 0.0–2.0)
BICARBONATE: 24.9 mmol/L (ref 20.0–28.0)
O2 Saturation: 92 %
PCO2 ART: 46.7 mmHg (ref 32.0–48.0)
PO2 ART: 71 mmHg — AB (ref 83.0–108.0)
Patient temperature: 37.2
TCO2: 26 mmol/L (ref 0–100)
pH, Arterial: 7.335 — ABNORMAL LOW (ref 7.350–7.450)

## 2016-03-31 LAB — POCT I-STAT, CHEM 8
BUN: 20 mg/dL (ref 6–20)
CALCIUM ION: 0.74 mmol/L — AB (ref 1.15–1.40)
Chloride: 107 mmol/L (ref 101–111)
Creatinine, Ser: 1.9 mg/dL — ABNORMAL HIGH (ref 0.61–1.24)
GLUCOSE: 119 mg/dL — AB (ref 65–99)
HCT: 21 % — ABNORMAL LOW (ref 39.0–52.0)
HEMOGLOBIN: 7.1 g/dL — AB (ref 13.0–17.0)
POTASSIUM: 2.4 mmol/L — AB (ref 3.5–5.1)
SODIUM: 143 mmol/L (ref 135–145)
TCO2: 18 mmol/L (ref 0–100)

## 2016-03-31 LAB — CBC
HCT: 33.6 % — ABNORMAL LOW (ref 39.0–52.0)
Hemoglobin: 11.3 g/dL — ABNORMAL LOW (ref 13.0–17.0)
MCH: 28.1 pg (ref 26.0–34.0)
MCHC: 33.6 g/dL (ref 30.0–36.0)
MCV: 83.6 fL (ref 78.0–100.0)
Platelets: 63 K/uL — ABNORMAL LOW (ref 150–400)
RBC: 4.02 MIL/uL — ABNORMAL LOW (ref 4.22–5.81)
RDW: 16.3 % — ABNORMAL HIGH (ref 11.5–15.5)
WBC: 13.6 K/uL — ABNORMAL HIGH (ref 4.0–10.5)

## 2016-03-31 LAB — BLOOD GAS, ARTERIAL
Acid-base deficit: 4.7 mmol/L — ABNORMAL HIGH (ref 0.0–2.0)
Bicarbonate: 21.5 mmol/L (ref 20.0–28.0)
Drawn by: 252031
FIO2: 100
MECHVT: 500 mL
O2 Saturation: 89.1 %
PEEP: 8 cmH2O
Patient temperature: 97
RATE: 18 resp/min
pCO2 arterial: 50 mmHg — ABNORMAL HIGH (ref 32.0–48.0)
pH, Arterial: 7.252 — ABNORMAL LOW (ref 7.350–7.450)
pO2, Arterial: 61.5 mmHg — ABNORMAL LOW (ref 83.0–108.0)

## 2016-03-31 LAB — COMPREHENSIVE METABOLIC PANEL WITH GFR
ALT: 72 U/L — ABNORMAL HIGH (ref 17–63)
AST: 129 U/L — ABNORMAL HIGH (ref 15–41)
Albumin: 2.3 g/dL — ABNORMAL LOW (ref 3.5–5.0)
Alkaline Phosphatase: 60 U/L (ref 38–126)
Anion gap: 14 (ref 5–15)
BUN: 29 mg/dL — ABNORMAL HIGH (ref 6–20)
CO2: 22 mmol/L (ref 22–32)
Calcium: 6.4 mg/dL — CL (ref 8.9–10.3)
Chloride: 99 mmol/L — ABNORMAL LOW (ref 101–111)
Creatinine, Ser: 2.8 mg/dL — ABNORMAL HIGH (ref 0.61–1.24)
GFR calc Af Amer: 24 mL/min — ABNORMAL LOW (ref >60)
GFR calc non Af Amer: 21 mL/min — ABNORMAL LOW (ref >60)
Glucose, Bld: 218 mg/dL — ABNORMAL HIGH (ref 65–99)
Potassium: 3.8 mmol/L (ref 3.5–5.1)
Sodium: 135 mmol/L (ref 135–145)
Total Bilirubin: 1 mg/dL (ref 0.3–1.2)
Total Protein: 4.6 g/dL — ABNORMAL LOW (ref 6.5–8.1)

## 2016-03-31 LAB — BPAM PLATELET PHERESIS
Blood Product Expiration Date: 201803182359
ISSUE DATE / TIME: 201803170953
UNIT TYPE AND RH: 6200

## 2016-03-31 LAB — GLUCOSE, CAPILLARY
GLUCOSE-CAPILLARY: 188 mg/dL — AB (ref 65–99)
GLUCOSE-CAPILLARY: 83 mg/dL (ref 65–99)
GLUCOSE-CAPILLARY: 125 mg/dL — AB (ref 65–99)
Glucose-Capillary: 176 mg/dL — ABNORMAL HIGH (ref 65–99)
Glucose-Capillary: 148 mg/dL — ABNORMAL HIGH (ref 65–99)

## 2016-03-31 LAB — COOXEMETRY PANEL
Carboxyhemoglobin: 0.8 % (ref 0.5–1.5)
Carboxyhemoglobin: 1.3 % (ref 0.5–1.5)
Methemoglobin: 1.4 % (ref 0.0–1.5)
Methemoglobin: 1.2 % (ref 0.0–1.5)
O2 Saturation: 60.1 %
O2 Saturation: 69.3 %
Total hemoglobin: 10.9 g/dL — ABNORMAL LOW (ref 12.0–16.0)
Total hemoglobin: 10.2 g/dL — ABNORMAL LOW (ref 12.0–16.0)

## 2016-03-31 LAB — PREPARE PLATELET PHERESIS: UNIT DIVISION: 0

## 2016-03-31 LAB — MAGNESIUM: Magnesium: 1.1 mg/dL — ABNORMAL LOW (ref 1.7–2.4)

## 2016-03-31 LAB — PREPARE RBC (CROSSMATCH)

## 2016-03-31 MED ORDER — DEXTROSE 5 % IV SOLN
2.0000 g | INTRAVENOUS | Status: DC
  Administered 2016-03-31 – 2016-04-01 (×2): 2 g via INTRAVENOUS
  Filled 2016-03-31 (×4): qty 2

## 2016-03-31 MED ORDER — MAGNESIUM SULFATE 2 GM/50ML IV SOLN
2.0000 g | Freq: Once | INTRAVENOUS | Status: AC
  Administered 2016-03-31: 2 g via INTRAVENOUS
  Filled 2016-03-31: qty 50

## 2016-03-31 MED ORDER — CALCIUM CHLORIDE 10 % IV SOLN
INTRAVENOUS | Status: AC
  Filled 2016-03-31: qty 10

## 2016-03-31 MED ORDER — CALCIUM CHLORIDE 10 % IV SOLN
1.0000 g | Freq: Once | INTRAVENOUS | Status: AC
  Administered 2016-03-31: 1 g via INTRAVENOUS

## 2016-03-31 MED ORDER — ALBUMIN HUMAN 5 % IV SOLN
25.0000 g | Freq: Once | INTRAVENOUS | Status: AC
  Administered 2016-03-31: 25 g via INTRAVENOUS
  Filled 2016-03-31: qty 500

## 2016-03-31 MED ORDER — INSULIN DETEMIR 100 UNIT/ML ~~LOC~~ SOLN
15.0000 [IU] | Freq: Two times a day (BID) | SUBCUTANEOUS | Status: DC
  Administered 2016-03-31 – 2016-04-03 (×6): 15 [IU] via SUBCUTANEOUS
  Filled 2016-03-31 (×8): qty 0.15

## 2016-03-31 MED ORDER — VASOPRESSIN 20 UNIT/ML IV SOLN
0.0300 [IU]/min | INTRAVENOUS | Status: DC
  Administered 2016-03-31 – 2016-04-01 (×2): 0.03 [IU]/min via INTRAVENOUS
  Filled 2016-03-31 (×2): qty 2

## 2016-03-31 MED ORDER — DEXTROSE 5 % IV SOLN
1.5000 g | INTRAVENOUS | Status: DC
  Filled 2016-03-31: qty 1.5

## 2016-03-31 NOTE — CV Procedure (Signed)
Electroencephalogram report- LTM  Ordering Physician : Wendee Beavers, MD    Beginning date and time: 03/30/2016 10:55AM Ending date and time:  03/31/2016 0930 hours  Day of study: 1   MENTAL STATUS (per technician's notes): Intubated. Sedated. Unresponsive.  HISTORY: This 24 hours of intensive EEG monitoring with simultaneous video monitoring was performed for this patient with unresponsiveness.  Patient is now intubated and sedated.  This EEG was requested to rule out subclinical electrographic seizures.  TECHNICAL DESCRIPTION:  The study consists of a continuous 16-channel multi-montage digital video EEG recording with twenty-one electrodes placed according to the International 10-20 System. Additional leads included eye leads, true temporal leads (T1, T2), and an EKG lead. Activation procedures were not done due to mental status.  REPORT: The background activity at the beginning of the recording consists of bursts of 1Hz  per second periodic discharges, followed by period of suppression. The discharges consist of higher amplitude frontocentral spikes on the right. At times right frontopolar spikes within the bursts occur in evolving  rhythmic runs of theta activity with intermixed delta suggestive of electrographic seizures.   As the recording progressed, there was increased  suppression and no further electrographic seizures were seen.  Towards the end of the recording the burst suppression pattern consisted of suppression lasting 10-15 seconds in between brief discharges.  There were no pushbutton activations events during this recording.   INTERPRETATION: This is an abnormal EEG due to: 1) Electrographic seizures seen in the right frontal central region 2) Bursts suppression pattern, with spikes in the right frontal central region  Clinical Correlation: This 24 hours of continuous EEG monitoring with simultaneous video monitoring preformed  for this patient who is intubated and sedated c/w  burst/suppression pattern and non-convulsive status epilepticus in the early part of the recording. Right frontocentral spikes and electrographic seizures were present till about  7:30PM. The seizures stopped as the suppression increased, likely due to increase in anesthetic medications.

## 2016-03-31 NOTE — Progress Notes (Signed)
S: patient on burst supression, though extremely deep.  I stood in front of the machine and only saw a flat line for over 1 minute without any activity.  No further seizures reported.  I am told he has been dropping his pressures.  ROS: A 14 point ROS was performed and is negative except as noted in the HPI.   Past Medical History:  Diagnosis Date  . Anemia   . Anxiety   . Arthritis    "touch in my hands" (01/12/2016)  . Bradycardia   . CAD (coronary artery disease)   . Chronic back pain    "upper and lower" (01/12/2016)  . Chronic bronchitis ([place])   . Chronic kidney disease    "I'm on a renal diet" (01/12/2016)  . COPD (chronic obstructive pulmonary disease) ([place])   . Depression   . GERD (gastroesophageal reflux disease)   . Heart murmur    "when I was little"  . History of fractured vertebra    "between shoulder blades"  . History of hiatal hernia   . History of stem cell transplant ([place])   . Hyperlipidemia   . Hypertension   . Hypotension   . Hypothyroidism   . Multiple myeloma ([place]) 2013   treated w/intensive chemo & bone marrow transplant per MD note  . Myocardial infarction    "I've had 2" (01/12/2016)  . Seizures ([place]) 2017   "when my potassium got too high" (01/12/2016)  . Shingles   . Sleep apnea    "I don't have a mask right now" (01/12/2016)  . Stroke ([place])    "I've had 2; fingers stay colder" (pointes to left hand; 3rd, 4th, and 5th digits" (01/12/2016)  . Type II diabetes mellitus ([facility])     Family History  Problem Relation Age of Onset  . Diabetes Mother   . Cancer Neg Hx   . Stroke Neg Hx     Social History:  reports that he quit smoking about 27 years ago. His smoking use included Cigarettes. He has a 70.00 pack-year smoking history. He has never used smokeless tobacco. He reports that he does not drink alcohol or use drugs.   Exam: Current vital signs: BP (!) 116/51   Pulse 73   Temp (!) 96.8 F (36 C)   Resp 18   Ht _0  (1.651 m)   Wt  90.1 kg (198 lb 10.2 oz)   SpO2 94%   BMI 33.05 kg/m  Vital signs in last 24 hours: Temp:  [96.6 F (35.9 C)-99.5 F (37.5 C)] 96.8 F (36 C) (03/18 0715) Pulse Rate:  [73-81] 73 (03/17 1500) Resp:  [0-34] 18 (03/18 0715) BP: (93-128)/(37-71) 116/51 (03/18 0700) SpO2:  [86 %-96 %] 94 % (03/18 0734) Arterial Line BP: (84-169)/(34-61) 109/45 (03/18 0715) FiO2 (%):  [90 %-100 %] 100 % (03/18 0734) Weight:  [90.1 kg (198 lb 10.2 oz)] 90.1 kg (198 lb 10.2 oz) (03/18 0600)   His neuro exam is non existent because of of the deep sedation. Flaccid, no response, no corneal reflexes, no cough or gag.    A/P: this pt was in status epilepticus and this has now resolved. He is on burst suppression, though extremely deep to the point the blood pressure is being compromised and there is no bursts of activity sometimes up to 60 seconds.  I have asked the versed to be downgraded to 20 and will continue to monitor.

## 2016-03-31 NOTE — Progress Notes (Signed)
      [address]       [place] [address]             [phone]      Intubated, sedated  BP 100/69 (BP Location: Left Arm)   Pulse 90   Temp 99 F (37.2 C) (Core (Comment))   Resp 18   Ht 5\' 5"  (1.651 m)   Wt 198 lb 10.2 oz (90.1 kg)   SpO2 92%   BMI 33.05 kg/m   47/27 CI= 2.2. Co-ox up to 69  BP dropped after valproate infusion. Started on vasopressin and BP improved  Remains critically ill  Remo Lipps C. Roxan Hockey, MD [facility] [phone]

## 2016-03-31 NOTE — Progress Notes (Addendum)
Pt suddenly drops heart rate to low 60s and maps dropped to low 50s. Pacer was attached to patient and was immediately turned to DDD at 27. Capturing appropriately. But maps were still in low 60s. Levo drip throughout the day had been increased from 18mcg to 43mcg and epi drip increased from 78mcg to 29mcg. MD called and notified of all these changes. Order to give 2 5% albumin. Will cont to monitor and assess patient.

## 2016-03-31 NOTE — Progress Notes (Signed)
2 Days Post-Op Procedure(s) (LRB): STERNAL WOUND IRRIGATION (N/A) WOUND VAC CHANGE (N/A) Subjective: intubated  Objective: Vital signs in last 24 hours: Temp:  [96.6 F (35.9 C)-99.5 F (37.5 C)] 96.8 F (36 C) (03/18 0715) Pulse Rate:  [73-81] 73 (03/17 1500) Cardiac Rhythm: Normal sinus rhythm (03/18 0715) Resp:  [0-34] 18 (03/18 0715) BP: (93-128)/(37-71) 116/51 (03/18 0700) SpO2:  [86 %-96 %] 94 % (03/18 0734) Arterial Line BP: (84-169)/(34-61) 109/45 (03/18 0715) FiO2 (%):  [90 %-100 %] 100 % (03/18 0734) Weight:  [198 lb 10.2 oz (90.1 kg)] 198 lb 10.2 oz (90.1 kg) (03/18 0600)  Hemodynamic parameters for last 24 hours: PAP: (37-51)/(16-31) 45/23 CVP:  [10 mmHg-28 mmHg] 18 mmHg CO:  [3.3 L/min-3.9 L/min] 3.6 L/min CI:  [1.9 L/min/m2-2.7 L/min/m2] 2.1 L/min/m2  Intake/Output from previous day: 03/17 0701 - 03/18 0700 In: 5544.4 [I.V.:4051.1; Blood:1033.3; NG/GT:180; IV Piggyback:250] Out: 2760 [WNUUV:2536; Emesis/NG output:50; Drains:25; Chest Tube:740] Intake/Output this shift: No intake/output data recorded.  General appearance: intubated Neurologic: no spontaneous movement Heart: regular rate and rhythm Lungs: clear anteriorly Abdomen: no BS Extremities: warm, brisk cap refill Wound: VAC in place  Lab Results:  Recent Labs  03/30/16 0356  03/30/16 2221 03/31/16 0500  WBC 7.4  --   --  13.6*  HGB 9.6*  < > 8.5* 11.3*  HCT 27.6*  < > 25.0* 33.6*  PLT 40*  --   --  63*  < > = values in this interval not displayed. BMET:  Recent Labs  03/30/16 0356  03/30/16 2221 03/31/16 0500  NA 140  < > 137 135  K 4.1  < > 3.9 3.8  CL 105  < > 98* 99*  CO2 23  --   --  22  GLUCOSE 175*  < > 210* 218*  BUN 26*  < > 28* 29*  CREATININE 2.50*  < > 2.60* 2.80*  CALCIUM 7.2*  --   --  6.4*  < > = values in this interval not displayed.  PT/INR:  Recent Labs  03/17/2016 0408  LABPROT 15.6*  INR 1.23   ABG    Component Value Date/Time   PHART 7.252 (L)  03/31/2016 0310   HCO3 21.5 03/31/2016 0310   TCO2 23 03/30/2016 2221   ACIDBASEDEF 4.7 (H) 03/31/2016 0310   O2SAT 60.1 03/31/2016 0313   CBG (last 3)   Recent Labs  03/30/16 2353 03/31/16 0326 03/31/16 0757  GLUCAP 180* 188* 176*    Assessment/Plan: S/P Procedure(s) (LRB): STERNAL WOUND IRRIGATION (N/A) WOUND VAC CHANGE (N/A) Remains critically ill with multiple organ system failure  NEURO_ status epilepticus- no seizure activity on EEG with high dose versed and propofol  CV- index OK, co-ox up to 60 on IABP, milrinone, epi, norepi  Requiring multiple pressors to counteract vasodilatation associated with sedation  RESP- VDRF, respiratory acidosis, sats better with increase to 10 PEEP  RENAL- acute renal failure. Creatinine up slightly from 2.6 to 2.8  Hypocalcemia- treated  Hypomagnesemia- mag sulfate ordered  ENDO- CBG persistently elevated, will add lantus  Anemia- transfused last night  Thrombocytopenia- improved after PLT transfusion yesterday  Prognosis remains grim   LOS: 9 days    Douglas Mccoy 03/31/2016

## 2016-03-31 NOTE — Progress Notes (Signed)
EC8 istat ran at 1312 results not consistant with previous results. Istat repeated and edit note sent to credit results from 1312. Please refer to repeated Istat at 1319 results are as followed.   NA 136 K 3.6 Chloride 97 Glucose 154 BUN 28 Creatinine 2.90 Calcium Ionized 0.97 Hemoglobin 9.9 HCT 29.0

## 2016-04-01 ENCOUNTER — Inpatient Hospital Stay (HOSPITAL_COMMUNITY): Payer: Medicare Other

## 2016-04-01 ENCOUNTER — Encounter (HOSPITAL_COMMUNITY): Admission: EM | Disposition: E | Payer: Self-pay | Source: Home / Self Care | Attending: Internal Medicine

## 2016-04-01 ENCOUNTER — Encounter (HOSPITAL_COMMUNITY): Payer: Self-pay | Admitting: Cardiothoracic Surgery

## 2016-04-01 DIAGNOSIS — J80 Acute respiratory distress syndrome: Secondary | ICD-10-CM

## 2016-04-01 DIAGNOSIS — R578 Other shock: Secondary | ICD-10-CM

## 2016-04-01 DIAGNOSIS — G931 Anoxic brain damage, not elsewhere classified: Secondary | ICD-10-CM

## 2016-04-01 DIAGNOSIS — G40901 Epilepsy, unspecified, not intractable, with status epilepticus: Secondary | ICD-10-CM

## 2016-04-01 DIAGNOSIS — J9601 Acute respiratory failure with hypoxia: Secondary | ICD-10-CM

## 2016-04-01 LAB — COOXEMETRY PANEL
Carboxyhemoglobin: 1 % (ref 0.5–1.5)
Carboxyhemoglobin: 1.1 % (ref 0.5–1.5)
Methemoglobin: 1.3 % (ref 0.0–1.5)
Methemoglobin: 1.5 % (ref 0.0–1.5)
O2 Saturation: 58.5 %
O2 Saturation: 58.5 %
Total hemoglobin: 10 g/dL — ABNORMAL LOW (ref 12.0–16.0)
Total hemoglobin: 9.7 g/dL — ABNORMAL LOW (ref 12.0–16.0)

## 2016-04-01 LAB — GLUCOSE, CAPILLARY
GLUCOSE-CAPILLARY: 105 mg/dL — AB (ref 65–99)
GLUCOSE-CAPILLARY: 120 mg/dL — AB (ref 65–99)
GLUCOSE-CAPILLARY: 139 mg/dL — AB (ref 65–99)
Glucose-Capillary: 120 mg/dL — ABNORMAL HIGH (ref 65–99)
Glucose-Capillary: 124 mg/dL — ABNORMAL HIGH (ref 65–99)

## 2016-04-01 LAB — PREPARE FRESH FROZEN PLASMA
UNIT DIVISION: 0
UNIT DIVISION: 0
UNIT DIVISION: 0
Unit division: 0
Unit division: 0
Unit division: 0
Unit division: 0

## 2016-04-01 LAB — POCT I-STAT, CHEM 8
BUN: 28 mg/dL — ABNORMAL HIGH (ref 6–20)
BUN: 30 mg/dL — ABNORMAL HIGH (ref 6–20)
CHLORIDE: 97 mmol/L — AB (ref 101–111)
Calcium, Ion: 0.97 mmol/L — ABNORMAL LOW (ref 1.15–1.40)
Calcium, Ion: 1 mmol/L — ABNORMAL LOW (ref 1.15–1.40)
Chloride: 92 mmol/L — ABNORMAL LOW (ref 101–111)
Creatinine, Ser: 2.9 mg/dL — ABNORMAL HIGH (ref 0.61–1.24)
Creatinine, Ser: 3.4 mg/dL — ABNORMAL HIGH (ref 0.61–1.24)
GLUCOSE: 154 mg/dL — AB (ref 65–99)
Glucose, Bld: 129 mg/dL — ABNORMAL HIGH (ref 65–99)
HCT: 29 % — ABNORMAL LOW (ref 39.0–52.0)
HEMATOCRIT: 30 % — AB (ref 39.0–52.0)
HEMOGLOBIN: 10.2 g/dL — AB (ref 13.0–17.0)
Hemoglobin: 9.9 g/dL — ABNORMAL LOW (ref 13.0–17.0)
POTASSIUM: 3.6 mmol/L (ref 3.5–5.1)
POTASSIUM: 3.7 mmol/L (ref 3.5–5.1)
SODIUM: 136 mmol/L (ref 135–145)
Sodium: 131 mmol/L — ABNORMAL LOW (ref 135–145)
TCO2: 24 mmol/L (ref 0–100)
TCO2: 23 mmol/L (ref 0–100)

## 2016-04-01 LAB — COMPREHENSIVE METABOLIC PANEL
ALT: 48 U/L (ref 17–63)
AST: 72 U/L — ABNORMAL HIGH (ref 15–41)
Albumin: 2.4 g/dL — ABNORMAL LOW (ref 3.5–5.0)
Alkaline Phosphatase: 75 U/L (ref 38–126)
Anion gap: 13 (ref 5–15)
BUN: 29 mg/dL — ABNORMAL HIGH (ref 6–20)
CO2: 23 mmol/L (ref 22–32)
Calcium: 6.4 mg/dL — CL (ref 8.9–10.3)
Chloride: 97 mmol/L — ABNORMAL LOW (ref 101–111)
Creatinine, Ser: 2.97 mg/dL — ABNORMAL HIGH (ref 0.61–1.24)
GFR calc Af Amer: 23 mL/min — ABNORMAL LOW (ref >60)
GFR calc non Af Amer: 20 mL/min — ABNORMAL LOW (ref >60)
Glucose, Bld: 116 mg/dL — ABNORMAL HIGH (ref 65–99)
Potassium: 3.9 mmol/L (ref 3.5–5.1)
Sodium: 133 mmol/L — ABNORMAL LOW (ref 135–145)
Total Bilirubin: 1.1 mg/dL (ref 0.3–1.2)
Total Protein: 4.4 g/dL — ABNORMAL LOW (ref 6.5–8.1)

## 2016-04-01 LAB — POCT I-STAT 3, ART BLOOD GAS (G3+)
ACID-BASE DEFICIT: 5 mmol/L — AB (ref 0.0–2.0)
ACID-BASE DEFICIT: 4 mmol/L — AB (ref 0.0–2.0)
ACID-BASE DEFICIT: 3 mmol/L — AB (ref 0.0–2.0)
BICARBONATE: 22.2 mmol/L (ref 20.0–28.0)
BICARBONATE: 23.6 mmol/L (ref 20.0–28.0)
BICARBONATE: 23.8 mmol/L (ref 20.0–28.0)
O2 SAT: 89 %
O2 SAT: 90 %
O2 Saturation: 87 %
PH ART: 7.255 — AB (ref 7.350–7.450)
PH ART: 7.291 — AB (ref 7.350–7.450)
PO2 ART: 62 mmHg — AB (ref 83.0–108.0)
PO2 ART: 65 mmHg — AB (ref 83.0–108.0)
PO2 ART: 64 mmHg — AB (ref 83.0–108.0)
Patient temperature: 36.7
Patient temperature: 36.8
TCO2: 24 mmol/L (ref 0–100)
TCO2: 25 mmol/L (ref 0–100)
TCO2: 25 mmol/L (ref 0–100)
pCO2 arterial: 52 mmHg — ABNORMAL HIGH (ref 32.0–48.0)
pCO2 arterial: 52.9 mmHg — ABNORMAL HIGH (ref 32.0–48.0)
pCO2 arterial: 49.2 mmHg — ABNORMAL HIGH (ref 32.0–48.0)
pH, Arterial: 7.239 — ABNORMAL LOW (ref 7.350–7.450)

## 2016-04-01 LAB — BPAM FFP
BLOOD PRODUCT EXPIRATION DATE: 201803192359
BLOOD PRODUCT EXPIRATION DATE: 201803202359
BLOOD PRODUCT EXPIRATION DATE: 201803202359
BLOOD PRODUCT EXPIRATION DATE: 201803202359
BLOOD PRODUCT EXPIRATION DATE: 201803202359
Blood Product Expiration Date: 201803202359
Blood Product Expiration Date: 201803202359
ISSUE DATE / TIME: 201803151000
ISSUE DATE / TIME: 201803151000
ISSUE DATE / TIME: 201803151000
ISSUE DATE / TIME: 201803151511
ISSUE DATE / TIME: 201803160009
ISSUE DATE / TIME: 201803151511
ISSUE DATE / TIME: 201803160009
UNIT TYPE AND RH: 6200
UNIT TYPE AND RH: 6200
UNIT TYPE AND RH: 6200
UNIT TYPE AND RH: 6200
UNIT TYPE AND RH: 6200
UNIT TYPE AND RH: 6200
Unit Type and Rh: 6200

## 2016-04-01 LAB — CBC
HCT: 30.4 % — ABNORMAL LOW (ref 39.0–52.0)
Hemoglobin: 10.2 g/dL — ABNORMAL LOW (ref 13.0–17.0)
MCH: 27.9 pg (ref 26.0–34.0)
MCHC: 33.6 g/dL (ref 30.0–36.0)
MCV: 83.1 fL (ref 78.0–100.0)
Platelets: 57 10*3/uL — ABNORMAL LOW (ref 150–400)
RBC: 3.66 MIL/uL — ABNORMAL LOW (ref 4.22–5.81)
RDW: 17.1 % — ABNORMAL HIGH (ref 11.5–15.5)
WBC: 8.5 10*3/uL (ref 4.0–10.5)

## 2016-04-01 LAB — MAGNESIUM: Magnesium: 1.4 mg/dL — ABNORMAL LOW (ref 1.7–2.4)

## 2016-04-01 SURGERY — CLOSURE, STERNUM
Anesthesia: General | Site: Chest

## 2016-04-01 MED ORDER — MAGNESIUM SULFATE 2 GM/50ML IV SOLN: 2.0000 g | Freq: Once | INTRAVENOUS | Status: AC

## 2016-04-01 MED ORDER — ALBUMIN HUMAN 5 % IV SOLN: 12.5000 g | Freq: Once | INTRAVENOUS | Status: AC

## 2016-04-01 MED ORDER — PANTOPRAZOLE SODIUM 40 MG IV SOLR
40.0000 mg | INTRAVENOUS | Status: DC
  Administered 2016-04-01 – 2016-04-02 (×2): 40 mg via INTRAVENOUS
  Filled 2016-04-01 (×2): qty 40

## 2016-04-01 MED ORDER — PROPOFOL 1000 MG/100ML IV EMUL
5.0000 ug/kg/min | INTRAVENOUS | Status: DC
  Administered 2016-04-02: 10 ug/kg/min via INTRAVENOUS
  Filled 2016-04-01: qty 100

## 2016-04-01 MED ORDER — SODIUM BICARBONATE 8.4 % IV SOLN
INTRAVENOUS | Status: AC
  Administered 2016-04-01: 50 meq
  Filled 2016-04-01: qty 50

## 2016-04-01 MED ORDER — CALCIUM CHLORIDE 10 % IV SOLN: 1.0000 g | Freq: Once | INTRAVENOUS | Status: DC

## 2016-04-01 MED ORDER — SODIUM CHLORIDE 0.9 % IV SOLN
30.0000 meq | Freq: Once | INTRAVENOUS | Status: DC
  Filled 2016-04-01: qty 15

## 2016-04-01 MED ORDER — SODIUM BICARBONATE 8.4 % IV SOLN: 50.0000 meq | Freq: Once | INTRAVENOUS | Status: AC

## 2016-04-01 MED ORDER — CALCIUM CHLORIDE 10 % IV SOLN: 1.0000 g | Freq: Once | INTRAVENOUS | Status: AC

## 2016-04-01 MED ORDER — CALCIUM CHLORIDE 10 % IV SOLN
INTRAVENOUS | Status: AC
  Administered 2016-04-01: 1000 mg
  Filled 2016-04-01: qty 10

## 2016-04-01 MED ORDER — NEPRO/CARBSTEADY PO LIQD
1000.0000 mL | ORAL | Status: DC
  Administered 2016-04-01: 1000 mL via ORAL
  Filled 2016-04-01 (×2): qty 1000

## 2016-04-01 MED ORDER — SODIUM CHLORIDE 0.9 % IV SOLN
Freq: Once | INTRAVENOUS | Status: AC
  Administered 2016-04-01: 12:00:00 via INTRAVENOUS

## 2016-04-01 MED ORDER — CALCIUM CHLORIDE 10 % IV SOLN
1.0000 g | Freq: Once | INTRAVENOUS | Status: AC
  Administered 2016-04-01: 1 g via INTRAVENOUS

## 2016-04-01 MED ORDER — FUROSEMIDE 10 MG/ML IJ SOLN
80.0000 mg | Freq: Once | INTRAMUSCULAR | Status: AC
  Administered 2016-04-01: 80 mg via INTRAVENOUS
  Filled 2016-04-01: qty 8

## 2016-04-01 MED ORDER — MAGNESIUM SULFATE 2 GM/50ML IV SOLN
INTRAVENOUS | Status: AC
  Administered 2016-04-01: 2 g
  Filled 2016-04-01: qty 50

## 2016-04-01 MED ORDER — METHYLPREDNISOLONE SODIUM SUCC 125 MG IJ SOLR
60.0000 mg | Freq: Two times a day (BID) | INTRAMUSCULAR | Status: DC
  Administered 2016-04-01 – 2016-04-03 (×4): 60 mg via INTRAVENOUS
  Filled 2016-04-01 (×4): qty 2

## 2016-04-01 MED ORDER — ALBUMIN HUMAN 5 % IV SOLN
INTRAVENOUS | Status: AC
  Administered 2016-04-01: 12.5 g
  Filled 2016-04-01: qty 250

## 2016-04-01 MED ORDER — SODIUM BICARBONATE 8.4 % IV SOLN
50.0000 meq | Freq: Once | INTRAVENOUS | Status: AC
  Administered 2016-04-01: 50 meq via INTRAVENOUS
  Filled 2016-04-01: qty 50

## 2016-04-01 NOTE — Progress Notes (Signed)
Pt placed flat to be cleaned for bowel movement. Large cuff leak noted. RT called to bedside to assess. CCMD at bedside. Bronch performed and tube exchanged. Pt desaturated during procedure but recovered quickly with recruitment. No CXR needed per Dr. Nelda Marseille with utilization of bronchoscope for placement verification. Pt hypertensive after procedure. Levophed weaned to maintain MAPs less than 90. Pt tolerated peri care.  Will continue to monitor pt closely.

## 2016-04-01 NOTE — Procedures (Signed)
Bronchoscopy Procedure Note Julez Huseby [id number] 1945/06/26  Procedure: Bronchoscopy Indications: Diagnostic evaluation of the airways  Procedure Details Consent: Unable to obtain consent because of emergent medical necessity. Time Out: Verified patient identification, verified procedure, site/side was marked, verified correct patient position, special equipment/implants available, medications/allergies/relevent history reviewed, required imaging and test results available.  Performed  In preparation for procedure, patient was given 100% FiO2 and bronchoscope lubricated. Sedation: Propofol and fentanyl  Airway entered and the following bronchi were examined: RUL, RML, RLL, LUL, LLL and Bronchi.   ETT was in the right spot but cuff was punctured Bronchoscope removed.  , Patient placed back on 100% FiO2 at conclusion of procedure.    Patient reintubated and ETT was inspected bronchoscopically and is 3 cm above carina.  Evaluation Hemodynamic Status: BP stable throughout; O2 sats: stable throughout Patient's Current Condition: stable Specimens:  None Complications: No apparent complications Patient did tolerate procedure well.   Jennet Maduro 04/11/2016

## 2016-04-01 NOTE — Progress Notes (Signed)
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 Patient currently on 100% FiO2 with 10 of PEEP. Chest x-ray shows no significant effusions and mild-moderate edema. With high dose sedation-anesthesia for seizures his mean arterial pressure has decreased and he is back on vasopressin and norepinephrine. Cardiac index is 2.1 and mixed venous saturation 58%. Patient AV paced with underlying junctional slow rhythm.  No seizure activity for the past 12 hours. Pupils are equal Extremities are warm Balloon pump remains at 1-1 augmentation with pressure 100-1 10  Objective: Vital signs in last 24 hours: Temp:  [97.2 F (36.2 C)-99.5 F (37.5 C)] 98.1 F (36.7 C) (03/19 0915) Pulse Rate:  [90] 90 (03/19 0747) Cardiac Rhythm: A-V Sequential paced (03/19 0800) Resp:  [12-24] 18 (03/19 0915) BP: (86-113)/(35-72) 97/64 (03/19 0900) SpO2:  [90 %-94 %] 92 % (03/19 0845) Arterial Line BP: (68-123)/(31-45) 105/38 (03/19 0915) FiO2 (%):  [100 %] 100 % (03/19 0748) Weight:  [212 lb 11.9 oz (96.5 kg)] 212 lb 11.9 oz (96.5 kg) (03/19 0800)  Hemodynamic parameters for last 24 hours: PAP: (44-57)/(24-33) 47/25 CVP:  [21 mmHg-23 mmHg] 22 mmHg CO:  [3.3 L/min-4 L/min] 3.4 L/min CI:  [1.9 L/min/m2-2.3 L/min/m2] 2 L/min/m2  Intake/Output from previous day: 03/18 0701 - 03/19 0700 In: 5979.7 [I.V.:5024.7; NG/GT:150; IV Piggyback:805] Out: 2380 [Urine:1595; Emesis/NG output:100; Drains:125; Chest Tube:560] Intake/Output this shift: Total I/O In: 426.4 [I.V.:396.4; NG/GT:30] Out: 200 [Urine:150; Chest Tube:50]  Physical exam  Sedated on ventilator Total body edema, head,  perineum, extremities Coarse breath sounds bilaterally No air leak from chest tubes with minimal drainage Abdomen soft with distant breath sounds   Lab Results:  Recent Labs  03/31/16 0500  03/31/16 1319 03/20/2016 0318  WBC 13.6*  --   --  8.5  HGB 11.3*  < > 9.9* 10.2*  HCT 33.6*  < > 29.0* 30.4*  PLT 63*  --   --  57*  < > = values in this interval not displayed. BMET:  Recent Labs  03/31/16 0500  03/31/16 1319 03/23/2016 0318  NA 135  < > 136 133*  K 3.8  < > 3.6 3.9  CL 99*  < > 97* 97*  CO2 22  --   --  23  GLUCOSE 218*  < > 154* 116*  BUN 29*  < > 28* 29*  CREATININE 2.80*  < > 2.90* 2.97*  CALCIUM 6.4*  --   --  6.4*  < > = values in this interval not displayed.  PT/INR: No results for input(s): LABPROT, INR in the last 72 hours. ABG    Component Value Date/Time   PHART 7.239 (L) 03/31/2016 0306   HCO3 22.2 03/17/2016 0306   TCO2 24 03/24/2016 0306   ACIDBASEDEF 5.0 (H) 03/21/2016 0306   O2SAT 58.5 03/25/2016 0450   CBG (last 3)   Recent Labs  03/31/16 2008 03/16/2016 0005 04/10/2016 0820  GLUCAP 125* 105* 120*    Assessment/Plan: S/P Procedure(s) (LRB): STERNAL WOUND IRRIGATION (N/A) WOUND VAC CHANGE (N/A) Continue to attempt diuresis so that RV edema will improve and sternum can be closed Postoperative acute on chronic renal failure Postoperative acute RV systolic heart failure Seizure disorder, acute on chronic Acute respiratory insufficiency requiring  prolonged ventilator support Patient  Sister . Douglas Mccoy has been updated on patient's  Condition. She understands that the prognosis for recovery and  meaningful neurologic recovery is guarded.  LOS: 10 days    Douglas Mccoy 04/07/2016

## 2016-04-01 NOTE — Progress Notes (Signed)
RT note: Sputum sample obtained and sent down to main lab without complications.   

## 2016-04-01 NOTE — Progress Notes (Signed)
EEG maint complete. No skin breakdown. Continue to monitor 

## 2016-04-01 NOTE — Op Note (Signed)
NAME:  RIGDON, MACOMBER                    ACCOUNT NO.:  MEDICAL RECORD NO.:  [id number]  LOCATION:                                 FACILITY:  PHYSICIAN:  Ivin Poot, M.D.  DATE OF BIRTH:  Aug 17, 1945  DATE OF PROCEDURE:  03/16/2016 DATE OF DISCHARGE:                              OPERATIVE REPORT   OPERATIONS: 1. Mediastinal irrigation. 2. Replacement of wound VAC system.  SURGEON:  Ivin Poot, M.D.  ANESTHESIA:  General.  PREOPERATIVE DIAGNOSIS:  Open chest covered with Esmarch dressing and wound VAC because of body edema and right ventricular dysfunction.  POSTOPERATIVE DIAGNOSIS:  Open chest covered with Esmarch dressing and wound VAC because of body edema and right ventricular dysfunction.  OPERATIVE PROCEDURE:  The patient was brought directly from the ICU to the operating room on postop day 1 for sternal irrigation and wound VAC change.  The procedure had been discussed in detail with the patient's sister, Jerrye Noble, and informed consent obtained.  The patient was placed supine on the operating table, and the Anesthesia team under intensive and invasive monitoring, administered general anesthesia and the patient remained stable.  The previously placed wound VAC system and Esmarch sponge were examined.  The wound VAC system was removed.  The Esmarch was left in place.  The chest and 1 of the 3 chest tubes was prepped and draped as a sterile field.  A proper time-out was performed.  The Esmarch dressing was removed.  There was a mild amount of clotted and non-clotted blood in the mediastinum.  This was removed.  The mediastinum was gently irrigated with vancomycin irrigation.  There was no active bleeding.  There was some generalized oozing from the sternal edge and mediastinal fat.  The RV appeared to be functioning.  Both vein grafts were patent with good flow.  The aortic repair with the Hemashield graft was intact.  Two new mediastinal drains using the  Bard catheters were placed anteriorly and posteriorly and brought out through the old chest tube sites and secured to the skin.  The previously placed right pleural tube was left intact.  The Esmarch sheet was then secured to the skin using running 3-0 Prolene.  Over the Esmarch, the wound VAC sponge and wound VAC plastic sterile drapes and Ioban drapes were placed.  This resulted in good compression of the wound VAC.  The patient remained stable.  The patient was then transported to his ICU bed and taken back to the ICU for further postoperative care and monitoring.     Ivin Poot, M.D.     PV/MEDQ  D:  04/04/2016  T:  04/10/2016  Job:  [id number]

## 2016-04-01 NOTE — Procedures (Signed)
Intubation Procedure Note Douglas Mccoy [id number] 02-Jun-1945  Procedure: Intubation Indications: Respiratory insufficiency  Procedure Details Consent: Unable to obtain consent because of emergent medical necessity. Time Out: Verified patient identification, verified procedure, site/side was marked, verified correct patient position, special equipment/implants available, medications/allergies/relevent history reviewed, required imaging and test results available.  Performed  Maximum sterile technique was used including gloves, hand hygiene and mask.  ETT exchanged over a tube changer    Evaluation Hemodynamic Status: BP stable throughout; O2 sats: Desaturated to 70 during reintubation but recovered quickly. Patient's Current Condition: stable Complications: No apparent complications Patient did tolerate procedure well. Chest X-ray ordered to verify placement.  CXR: pending.   Jennet Maduro 03/19/2016

## 2016-04-01 NOTE — Care Management Note (Signed)
Case Management Note  Patient Details  Name: Douglas Mccoy MRN: [id number] Date of Birth: [birth year]  Subjective/Objective:     NCM spoke to pt and states he goes to [place]. He has RW and cane at home. He currently lives in the [place] housing. Will need taxi voucher at [place]. Will consult with CSW for transportation. Pt states he uses [facility] to his [facility] appts. Pt does not have a neb machine.   PCP [facility]  3/19 Tomi Bamberger RN, BSN- Redo CABG x 2, complicated by aortic adhesions with intraoperative bleeding. conts on vent with open chest with vac in place, conts on pressors and abx.  NCM will cont to follow for dc needs.               Action/Plan:   Expected Discharge Date:                  Expected Discharge Plan:  [place]  In-House Referral:     Discharge planning Services  CM Consult  Post Acute Care Choice:    Choice offered to:     DME Arranged:    DME Agency:     HH Arranged:    [place] Agency:     Status of Service:  In process, will continue to follow  If discussed at Long Length of Stay Meetings, dates discussed:    Additional Comments:  Douglas Mayo, RN 04/02/2016, 9:51 PM

## 2016-04-01 NOTE — Progress Notes (Addendum)
Neurology Progress Note  I am assuming neurologic care of this patient. I reviewed his chart in detail. This is summarized as follows.  In brief, this is a 71 year old man with multiple chronic medical issues who was admitted on 04/09/2016 for a redo CABG which was performed on 04/04/2016. CABG was being performed in hopes of treating the patient's syncopal episodes which were believed to be due to severe ischemic cardiomyopathy after extensive neurologic and and cardiac evaluations. The surgery was felt to be high risk but the patient wanted to proceed.   Unfortunately, he had significant complications during surgery. According to the operative note, he developed catastrophic bleeding from the aorta which was discovered to be fused to the inner table of his sternum. Once hemostasis was obtained, it was noted that his aorta was very friable and thin. As a result, the decision was made to proceed with hypothermic circulatory arrest rather than crossclamping the delicate aorta. After his bypass grafts were placed, the aorta was reconstructed and repaired with a graft. Due to his blood loss and development of severe coagulopathy, he received substantial transfusions of platelets, FFP, cryoprecipitate, and PRBCs. An aortic balloon pump was placed and he was transferred to the intensive care unit. He was returned to the operating room on 04/04/2016 for mediastinal irrigation and replacement of his wound VAC.  According to notes, he was noted to have some twitching of his face postoperatively. This became more profound and on 03/30/16 neurology consultation was obtained. On examination, he was noted to have "vigorous twitching of the left face and left gaze deviation and blinking." Due to concern for status epilepticus, he was given a load of IV Keppra and had his baseline dose increased to 1000 mg every 12 hours. Continuous EEG recording was initiated. He continued to have intermittent episodes of seizure activity with  frequent periodic discharges on EEG. 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 He was on a Versed drip for sedation and propofol was added. His Keppra was increased to 1500 mg twice daily with the addition of Vimpat 200 mg twice daily and Depacon 500 mg 3 times daily. His propofol was titrated to burst suppression on the EEG. On 03/31/16, the neurologist noted that he is having very infrequent bursts on the bedside EEG and decreased his Versed drip from 30 mg per hour to 20 mg per hour. He has had some episodes of bradycardia with heart rate in the 50s and 60s associated with some hypotension. His nurse this morning reports no additional clinical seizure activity has been observed.  Medications reviewed and reconciled.   Pertinent meds: Versed infusion 15 mg/hour Propofol infusion 20 mg/kg/minute Vasopressin infusion 0.03 units/minute Epinephrine infusion 20 mcg/minute Norepinephrine infusion 32 mcg/minute Depacon 500 mg IV every 8 hours Keppra 1500 mg IV every 24 hours Vimpat 200 mg IV every 12 hours Gabapentin 600 mg twice daily  Current Meds:   Current Facility-Administered Medications:  .  0.45 % sodium chloride infusion, , Intravenous, Continuous PRN, Erin R Barrett, PA-C, Last Rate: 10 mL/hr at 03/27/2016 0800 .  0.9 %  sodium chloride infusion, 250 mL, Intravenous, Continuous, Erin R Barrett, PA-C, Last Rate: 1 mL/hr at 04/06/2016 0522, 250 mL at 04/07/2016 0522 .  0.9 %  sodium chloride infusion, , Intravenous, Continuous, Erin R Barrett, PA-C, Last Rate: 10 mL/hr at 03/15/2016 0800 .  0.9 %  sodium chloride infusion, , Intravenous, Once, Ivin Poot, MD .  0.9 %  sodium chloride infusion, , Intravenous, Once,  Ivin Poot, MD .  0.9 %  sodium chloride infusion, , Intravenous, Once, Laretta Alstrom, CRNA .  acetaminophen (TYLENOL) tablet 1,000 mg, 1,000 mg, Oral, Q6H **OR** acetaminophen (TYLENOL) solution 1,000 mg, 1,000 mg, Per Tube, Q6H, Erin R Barrett, PA-C, 1,000 mg  at 03/14/2016 0539 .  acetaminophen (TYLENOL) solution 650 mg, 650 mg, Per Tube, Once **OR** acetaminophen (TYLENOL) suppository 650 mg, 650 mg, Rectal, Once, Erin R Barrett, PA-C .  acyclovir (ZOVIRAX) 200 MG capsule 200 mg, 200 mg, Oral, BID, Lezlie Octave Black, NP, 200 mg at 03/31/16 2133 .  albuterol (PROVENTIL) (2.5 MG/3ML) 0.083% nebulizer solution 2.5 mg, 2.5 mg, Inhalation, Q6H PRN, Jonetta Osgood, MD .  [EXPIRED] amiodarone (NEXTERONE PREMIX) 360-4.14 MG/200ML-% (1.8 mg/mL) IV infusion, 60 mg/hr, Intravenous, Continuous, Stopped at 03/30/16 1525 **FOLLOWED BY** amiodarone (NEXTERONE PREMIX) 360-4.14 MG/200ML-% (1.8 mg/mL) IV infusion, 30 mg/hr, Intravenous, Continuous, Melrose Nakayama, MD, Last Rate: 16.7 mL/hr at 04/12/2016 0800, 30 mg/hr at 03/16/2016 0800 .  aspirin EC tablet 325 mg, 325 mg, Oral, Daily **OR** aspirin chewable tablet 324 mg, 324 mg, Per Tube, Daily, Erin R Barrett, PA-C, 324 mg at 03/31/16 1000 .  atorvastatin (LIPITOR) tablet 80 mg, 80 mg, Oral, q1800, Eileen Stanford, PA-C, 80 mg at 03/31/16 1806 .  bisacodyl (DULCOLAX) EC tablet 10 mg, 10 mg, Oral, Daily **OR** bisacodyl (DULCOLAX) suppository 10 mg, 10 mg, Rectal, Daily, Erin R Barrett, PA-C, 10 mg at 03/30/16 0931 .  cefTAZidime (FORTAZ) 2 g in dextrose 5 % 50 mL IVPB, 2 g, Intravenous, Q24H, Ivin Poot, MD, 2 g at 03/31/16 1943 .  chlorhexidine gluconate (MEDLINE KIT) (PERIDEX) 0.12 % solution 15 mL, 15 mL, Mouth Rinse, BID, Ivin Poot, MD, 15 mL at 03/31/2016 0800 .  Chlorhexidine Gluconate Cloth 2 % PADS 6 each, 6 each, Topical, Daily, Ivin Poot, MD, 6 each at 03/31/16 2131 .  dexmedetomidine (PRECEDEX) 200 MCG/50ML (4 mcg/mL) infusion, 0-0.7 mcg/kg/hr, Intravenous, Continuous, Erin R Barrett, PA-C, Stopped at 03/31/16 0000 .  docusate sodium (COLACE) capsule 200 mg, 200 mg, Oral, Daily, Erin R Barrett, PA-C .  EPINEPHrine (ADRENALIN) 4 mg in dextrose 5 % 250 mL (0.016 mg/mL) infusion, 0-20 mcg/min,  Intravenous, Titrated, Ivin Poot, MD, Last Rate: 75 mL/hr at 04/11/2016 0800, 20 mcg/min at 04/05/2016 0800 .  fluticasone (FLONASE) 50 MCG/ACT nasal spray 2 spray, 2 spray, Each Nare, Daily PRN, Radene Gunning, NP .  furosemide (LASIX) 250 mg in dextrose 5 % 250 mL (1 mg/mL) infusion, 15 mg/hr, Intravenous, Continuous, Melrose Nakayama, MD, Last Rate: 15 mL/hr at 03/15/2016 0800, 15 mg/hr at 04/07/2016 0800 .  furosemide (LASIX) injection 80 mg, 80 mg, Intravenous, Once, Ivin Poot, MD .  gabapentin (NEURONTIN) tablet 600 mg, 600 mg, Oral, BID, Jonetta Osgood, MD, 600 mg at 03/31/16 2131 .  insulin aspart (novoLOG) injection 0-24 Units, 0-24 Units, Subcutaneous, Q4H, Ivin Poot, MD, 2 Units at 03/31/16 2014 .  insulin detemir (LEVEMIR) injection 15 Units, 15 Units, Subcutaneous, BID, Melrose Nakayama, MD, 15 Units at 03/31/16 2133 .  lacosamide (VIMPAT) 200 mg in sodium chloride 0.9 % 25 mL IVPB, 200 mg, Intravenous, Q12H, Sallyanne Havers, MD, 200 mg at 03/31/16 2300 .  lactated ringers infusion, , Intravenous, Continuous, Erin R Barrett, PA-C, Stopped at 04/08/2016 0800 .  lactated ringers infusion, , Intravenous, Continuous, Erin R Barrett, PA-C, Last Rate: 10 mL/hr at 03/30/2016 0800 .  levalbuterol (XOPENEX) nebulizer solution 1.25  mg, 1.25 mg, Nebulization, Q6H, Ivin Poot, MD, 1.25 mg at 03/20/2016 0748 .  levETIRAcetam (KEPPRA) 1,500 mg in sodium chloride 0.9 % 100 mL IVPB, 1,500 mg, Intravenous, Q24H, Sallyanne Havers, MD, 1,500 mg at 03/31/16 0930 .  levothyroxine (SYNTHROID, LEVOTHROID) tablet 50 mcg, 50 mcg, Oral, QAC breakfast, Lezlie Octave Black, NP, 50 mcg at 03/31/16 0800 .  LORazepam (ATIVAN) injection 1 mg, 1 mg, Intravenous, Q4H PRN, Melrose Nakayama, MD, 1 mg at 03/30/16 3212 .  MEDLINE mouth rinse, 15 mL, Mouth Rinse, QID, Ivin Poot, MD, 15 mL at 04/10/2016 0439 .  metoCLOPramide (REGLAN) injection 10 mg, 10 mg, Intravenous, Q6H, Ivin Poot, MD, 10 mg at 03/27/2016  0539 .  metoprolol (LOPRESSOR) injection 2.5-5 mg, 2.5-5 mg, Intravenous, Q2H PRN, Erin R Barrett, PA-C .  metoprolol tartrate (LOPRESSOR) tablet 12.5 mg, 12.5 mg, Oral, BID **OR** metoprolol tartrate (LOPRESSOR) 25 mg/10 mL oral suspension 12.5 mg, 12.5 mg, Per Tube, BID, Erin R Barrett, PA-C .  midazolam (VERSED) 250 mg in sodium chloride 0.9 % 250 mL (1 mg/mL) infusion, 15 mg/hr, Intravenous, Continuous, Myrle Sheng, MD, Last Rate: 10 mL/hr at 03/27/2016 0800, 10 mg/hr at 03/22/2016 0800 .  midazolam (VERSED) injection 2 mg, 2 mg, Intravenous, Q1H PRN, Erin R Barrett, PA-C, 2 mg at 03/30/16 1930 .  milrinone (PRIMACOR) 20 MG/100 ML (0.2 mg/mL) infusion, 0.375 mcg/kg/min (Order-Specific), Intravenous, Continuous, Ivin Poot, MD, Last Rate: 7.4 mL/hr at 03/17/2016 0800, 0.375 mcg/kg/min at 03/22/2016 0800 .  morphine 2 MG/ML injection 2-5 mg, 2-5 mg, Intravenous, Q1H PRN, Erin R Barrett, PA-C, 2 mg at 03/31/16 0400 .  mupirocin ointment (BACTROBAN) 2 % 1 application, 1 application, Nasal, BID, Ivin Poot, MD, 1 application at 24/82/50 2134 .  nitroGLYCERIN 50 mg in dextrose 5 % 250 mL (0.2 mg/mL) infusion, 0-100 mcg/min, Intravenous, Titrated, Erin R Barrett, PA-C .  norepinephrine (LEVOPHED) 16 mg in dextrose 5 % 250 mL (0.064 mg/mL) infusion, 0-40 mcg/min, Intravenous, Titrated, Melrose Nakayama, MD, Last Rate: 26.3 mL/hr at 04/04/2016 0800, 28 mcg/min at 04/05/2016 0800 .  ondansetron (ZOFRAN) injection 4 mg, 4 mg, Intravenous, Q6H PRN, Erin R Barrett, PA-C .  oxyCODONE (Oxy IR/ROXICODONE) immediate release tablet 5-10 mg, 5-10 mg, Oral, Q3H PRN, Erin R Barrett, PA-C .  pantoprazole (PROTONIX) EC tablet 40 mg, 40 mg, Oral, Daily, Erin R Barrett, PA-C .  propofol (DIPRIVAN) 1000 MG/100ML infusion, 5-80 mcg/kg/min, Intravenous, Titrated, Myrle Sheng, MD, Last Rate: 10.5 mL/hr at 03/20/2016 0800, 20 mcg/kg/min at 04/05/2016 0800 .  sertraline (ZOLOFT) tablet 50 mg, 50 mg, Oral, Daily,  Lezlie Octave Black, NP, 50 mg at 03/31/16 0370 .  sodium chloride flush (NS) 0.9 % injection 3 mL, 3 mL, Intravenous, Q12H, Erin R Barrett, PA-C, 3 mL at 03/31/16 2134 .  sodium chloride flush (NS) 0.9 % injection 3 mL, 3 mL, Intravenous, PRN, Erin R Barrett, PA-C .  valproate (DEPACON) 500 mg in dextrose 5 % 50 mL IVPB, 500 mg, Intravenous, Q8H, Greta Doom, MD, 500 mg at 03/20/2016 0540 .  vasopressin (PITRESSIN) 40 Units in sodium chloride 0.9 % 250 mL (0.16 Units/mL) infusion, 0.03 Units/min, Intravenous, Continuous, Melrose Nakayama, MD, Last Rate: 11.3 mL/hr at 03/27/2016 0800, 0.03 Units/min at 03/19/2016 0800  Objective:  Temp:  [97.2 F (36.2 C)-99.5 F (37.5 C)] 98.1 F (36.7 C) (03/19 0800) Pulse Rate:  [90] 90 (03/19 0336) Resp:  [12-24] 18 (03/19 0800) BP: (86-113)/(35-72) 97/65 (03/19 0800)  SpO2:  [90 %-94 %] 90 % (03/19 0800) Arterial Line BP: (68-123)/(31-45) 103/44 (03/19 0800) FiO2 (%):  [100 %] 100 % (03/19 0748) Weight:  [96.5 kg (212 lb 11.9 oz)] 96.5 kg (212 lb 11.9 oz) (03/19 0800)  General: WD obese elderly Caucasian man lying in ICU bed. He is intubated and sedated. There is no eye opening, either spontaneously or with stimulation. He does not follow any commands.  HEENT: Neck is supple without lymphadenopathy. ETT, OGT in place. Right IJ line in place. Sclerae are anicteric with mild edema. There is no conjunctival injection.  CV: Regular, distant, no obvious murmur. Carotid pulses are 2+ and symmetric with no bruits.  Lungs: CTAB on anterior exam. Ventilated. Extremities: No cyanosis. Distal pulses 1+. Patchy ecchymosis on the knees. Neuro: MS: As noted above.  CN: Pupils are equal and reactive from 3-->2 mm bilaterally. He does not blink to visual threat. His eyes are conjugated with no forced deviation and no nystagmus. He has a weak corneal on the left. The right corneal appears to be absent. His face appears to be grossly symmetric but is partly obscured by  tubes and tape. No gag with endotracheal tube manipulation. The remainder of his cranial nerves cannot be accurately assessed as he is unable to participate with the examination.  Motor: Normal bulk. He is flaccid throughout. No spontaneous movements of the extremities were observed. No seizure or other abnormal motor activity was seen. Sensation: No response to nailbed pressure 4. No response to supraorbital pressure on either side.  DTRs: 2+, symmetric with the exception of absent ankle jerks bilaterally. Toes are mute bilaterally.   Coordination and gait: These cannot be assessed as the patient is unable to participate with the examination.   Labs: Lab Results  Component Value Date   WBC 8.5 03/15/2016   HGB 10.2 (L) 04/08/2016   HCT 30.4 (L) 03/27/2016   PLT 57 (L) 03/26/2016   GLUCOSE 116 (H) 03/16/2016   CHOL 68 03/16/2016   TRIG 97 03/30/2016   HDL 31 (L) 04/05/2016   LDLCALC 23 04/12/2016   ALT 48 04/04/2016   AST 72 (H) 04/06/2016   NA 133 (L) 03/22/2016   K 3.9 03/26/2016   CL 97 (L) 03/31/2016   CREATININE 2.97 (H) 03/20/2016   BUN 29 (H) 03/27/2016   CO2 23 04/08/2016   TSH 5.763 (H) 03/19/2016   INR 1.23 04/09/2016   HGBA1C 8.4 (H) 03/27/2016   CBC Latest Ref Rng & Units 04/10/2016 03/31/2016 03/31/2016  WBC 4.0 - 10.5 K/uL 8.5 - -  Hemoglobin 13.0 - 17.0 g/dL 10.2(L) 9.9(L) 7.1(L)  Hematocrit 39.0 - 52.0 % 30.4(L) 29.0(L) 21.0(L)  Platelets 150 - 400 K/uL 57(L) - -    Lab Results  Component Value Date   HGBA1C 8.4 (H) 03/27/2016   Lab Results  Component Value Date   ALT 48 04/07/2016   AST 72 (H) 03/24/2016   ALKPHOS 75 04/05/2016   BILITOT 1.1 03/26/2016    Radiology:  There is no neuro imaging for review  Other diagnostic studies:  The EEG was reviewed in real time at the bedside for several minutes. He has rare bursts of theta/delta activity superimposed upon global background suppression. No evidence of seizure was seen on the recording.  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 Seizures in this case may have been provoked by relative cerebral hypoperfusion in the setting of, dated bypass surgery. Consideration should also  be given to the possibility of an acute intracranial event such as stroke or hemorrhage. This point, it appears that his seizures are been controlled for approximately 36 hours. It would be reasonable to decrease his sedation and follow his EEG for reemergence of seizures. I will wean him off of his Versed and observe EEG. Continue Keppra, Vimpat, Depacon, propofol. He has had some episodes of bradycardia and hypotension which could be in part due to some of his seizure medications. Will continue to follow and adjust medications and doses as appropriate depending upon how he responds to reducing sedation. Continue seizure precautions. Continue long-term EEG recording for now.  No family was present at the bedside.  This patient is critically ill and at significant risk of neurological worsening, death and care requires constant monitoring of vital signs, hemodynamics,respiratory and cardiac monitoring, neurological assessment, discussion with family, other specialists and medical decision making of high complexity. A total of 50 minutes of critical care time was spent on this case.   Melba Coon, MD [facility]

## 2016-04-01 NOTE — Progress Notes (Signed)
Dr. Prescott Gum paged regarding 1200 ABG results and continued increase of Levophed to obtain MAP goal >65. Platelets infusing. Order received for 60 mg Solu-Medrol Q 12H starting now. Will continue to monitor pt closely.

## 2016-04-01 NOTE — CV Procedure (Signed)
Electroencephalogram report- LTM  Ordering Physician : Wendee Beavers, MD    Beginning date and time: 03/31/2016 0930AM Ending date and time:  04/02/2016 0730 hours  Day of study: 2   MENTAL STATUS (per technician's notes): Intubated. Sedated. Unresponsive.  HISTORY: This 24 hours of intensive EEG monitoring with simultaneous video monitoring was performed for this patient with unresponsiveness.  Patient is now intubated and sedated.  This EEG was requested to rule out subclinical electrographic seizures.  TECHNICAL DESCRIPTION:  The study consists of a continuous 16-channel multi-montage digital video EEG recording with twenty-one electrodes placed according to the International 10-20 System. Additional leads included eye leads, true temporal leads (T1, T2), and an EKG lead. Activation procedures were not done due to mental status.  REPORT: The EEG consists of burst suppression pattern, with 10-15 seconds of suppression followed by 2-3 seconds of bursts.  No electrographic seizures were seen.  There were no pushbutton activations events during this recording.   INTERPRETATION: This is an abnormal EEG due to: 1) Bursts suppression pattern  Clinical Correlation: This 24 hours of continuous EEG monitoring with simultaneous video monitoring preformed  for this patient who is intubated and sedated c/w burst/suppression pattern, likely secondary to sedating medications. No electrographic seizures were seen.

## 2016-04-01 NOTE — Consult Note (Signed)
PULMONARY / CRITICAL CARE MEDICINE   Name: Douglas Mccoy MRN: [id number] DOB: 30-Dec-1945    ADMISSION DATE:  03/22/2016 CONSULTATION DATE:  03/26/2016  REFERRING MD:  Dr. Prescott Gum   CHIEF COMPLAINT:  Acute Hypoxemic Respiratory Failure   HISTORY OF PRESENT ILLNESS:   71 y/o M admitted on 3/8 with recurrent syncopal episodes.  He has an extensive medical history to include myeloma s/p bone marrow transplantation on maintenance chemotherapy at the [facility], HTN, Anemia, CKD, chronic thrombocytopenia.  He was evaluated by Neurology and Cardiology and felt to have ischemic cardiomyopathy as etiology of his syncopal episodes.  CVTS was consulted and the patient underwent re-do CABG on 0/08 which was complicated by sternal adhesions with significant bleeding and cardiac arrest.  He was returned to ICU with an open chest / VAC in place and on multiple vasopressors.  Course further complicated by suspected seizures vs myoclonus.  He developed increased O2 needs and PCCM consulted for evaluation 3/19.     PAST MEDICAL HISTORY :  He  has a past medical history of Anemia; Anxiety; Arthritis; Bradycardia; CAD (coronary artery disease); Chronic back pain; Chronic bronchitis ([place]); Chronic kidney disease; COPD (chronic obstructive pulmonary disease) ([place]); Depression; GERD (gastroesophageal reflux disease); Heart murmur; History of fractured vertebra; History of hiatal hernia; History of stem cell transplant ([place]); Hyperlipidemia; Hypertension; Hypotension; Hypothyroidism; Multiple myeloma ([place]) (2013); Myocardial infarction; Seizures ([place]) (2017); Shingles; Sleep apnea; Stroke [facility]); and Type II diabetes mellitus ([place]).  PAST SURGICAL HISTORY: He  has a past surgical history that includes Carotid angioplasty (Right); Tonsillectomy (~ 1950); Laparoscopic cholecystectomy; Coronary artery bypass graft (2004); Coronary/Graft Angiography (N/A, 04/08/2016); Aortogram (03/31/2016); Coronary artery bypass graft (N/A,  04/11/2016); TEE without cardioversion (N/A, 04/07/2016); Sternal wound debridement (N/A, 6/76/1950); and Application if wound vac (N/A, 04/09/2016).  Allergies  Allergen Reactions  . No Known Allergies     No current facility-administered medications on file prior to encounter.    No current outpatient prescriptions on file prior to encounter.    FAMILY HISTORY:  His indicated that his mother is deceased. He indicated that his father is deceased. He indicated that the status of his neg hx is unknown.    SOCIAL HISTORY: He  reports that he quit smoking about 28 years ago. His smoking use included Cigarettes. He has a 70.00 pack-year smoking history. He has never used smokeless tobacco. He reports that he does not drink alcohol or use drugs.  REVIEW OF SYSTEMS:  Unable to complete as patient is altered on mechanical ventilation.   SUBJECTIVE:  RN reports concern for blown ETT cuff.   VITAL SIGNS: BP 93/61   Pulse 90   Temp 98.2 F (36.8 C)   Resp 20   Ht 5' 5" (1.651 m)   Wt 212 lb 11.9 oz (96.5 kg)   SpO2 91%   BMI 35.40 kg/m   HEMODYNAMICS: PAP: (44-57)/(25-33) 52/28 CVP:  [22 mmHg-23 mmHg] 23 mmHg CO:  [3.3 L/min-4 L/min] 3.5 L/min CI:  [1.9 L/min/m2-2.3 L/min/m2] 2 L/min/m2  VENTILATOR SETTINGS: Vent Mode: PRVC FiO2 (%):  [100 %] 100 % Set Rate:  [18 bmp-20 bmp] 20 bmp Vt Set:  [500 mL] 500 mL PEEP:  [10 cmH20-12 cmH20] 10 cmH20 Pressure Support:  [5 cmH20] 5 cmH20 Plateau Pressure:  [35 cmH20-43 cmH20] 41 cmH20  INTAKE / OUTPUT: I/O last 3 completed shifts: In: 9282.6 [I.V.:7517.6; Blood:670; Other:30; NG/GT:210; IV Piggyback:855] Out: 3190 [Urine:2265; Emesis/NG output:150; Drains:125; Chest Tube:650]  PHYSICAL EXAMINATION: General:  Critically  ill appearing male on vent, multiple vasopressors  Neuro:  sedate HEENT:  ETT, R IJ line c/d/i Cardiovascular:  s1s2 distant tones, IABP mechanical noise, mediastinal chest tubes, open chest with VAC in  place Lungs:  Even/non-labored on vent, coarse breath sounds, faint basilar crackles  Abdomen:  Obese/soft Musculoskeletal:  No acute deformities  Skin:  Warm/dry, generalized edema, scrotal bruising/edema  LABS:  BMET  Recent Labs Lab 03/30/16 0356  03/31/16 0500 03/31/16 1312 03/31/16 1319 04/10/2016 0318  NA 140  < > 135 143 136 133*  K 4.1  < > 3.8 2.4* 3.6 3.9  CL 105  < > 99* 107 97* 97*  CO2 23  --  22  --   --  23  BUN 26*  < > 29* 20 28* 29*  CREATININE 2.50*  < > 2.80* 1.90* 2.90* 2.97*  GLUCOSE 175*  < > 218* 119* 154* 116*  < > = values in this interval not displayed.  Electrolytes  Recent Labs Lab 03/30/16 0356 03/31/16 0500 03/27/2016 0318  CALCIUM 7.2* 6.4* 6.4*  MG 1.1* 1.1* 1.4*    CBC  Recent Labs Lab 03/30/16 0356  03/31/16 0500 03/31/16 1312 03/31/16 1319 03/15/2016 0318  WBC 7.4  --  13.6*  --   --  8.5  HGB 9.6*  < > 11.3* 7.1* 9.9* 10.2*  HCT 27.6*  < > 33.6* 21.0* 29.0* 30.4*  PLT 40*  --  63*  --   --  57*  < > = values in this interval not displayed.  Coag's  Recent Labs Lab 03/30/2016 1800 04/09/2016 2008 03/17/2016 0408  APTT 116* 93* 40*  INR 4.44* 1.60 1.23    Sepsis Markers No results for input(s): LATICACIDVEN, PROCALCITON, O2SATVEN in the last 168 hours.  ABG  Recent Labs Lab 04/04/2016 0306 04/10/2016 0944 03/18/2016 1206  PHART 7.239* 7.255* 7.291*  PCO2ART 52.0* 52.9* 49.2*  PO2ART 62.0* 65.0* 64.0*    Liver Enzymes  Recent Labs Lab 03/30/16 0356 03/31/16 0500 04/13/2016 0318  AST 151* 129* 72*  ALT 67* 72* 48  ALKPHOS 38 60 75  BILITOT 1.6* 1.0 1.1  ALBUMIN 2.4* 2.3* 2.4*    Cardiac Enzymes No results for input(s): TROPONINI, PROBNP in the last 168 hours.  Glucose  Recent Labs Lab 03/31/16 1206 03/31/16 1625 03/31/16 2008 03/27/2016 0005 04/06/2016 0820 03/20/2016 1140  GLUCAP 148* 83 125* 105* 120* 120*    Imaging Dg Chest Port 1 View  Result Date: 04/07/2016 CLINICAL DATA:  Intubation.   Pulmonary edema . EXAM: PORTABLE CHEST 1 VIEW COMPARISON:  03/31/2016 . FINDINGS: Endotracheal tube, NG tube, Swan-Ganz catheter, bilateral chest tubes in stable position. Prior CABG. Cardiomegaly with diffuse bilateral pulmonary infiltrates and small left pleural effusion. No pneumothorax . IMPRESSION: 1.  Lines and tubes in stable position. 2. Prior CABG. Cardiomegaly with diffuse bilateral pulmonary infiltrates and small left pleural effusion consistent with CHF. No significant change. 3. Basilar atelectasis. Electronically Signed   By: Marcello Moores  Register   On: 04/12/2016 07:15   Dg Abd Portable 1v  Result Date: 04/12/2016 CLINICAL DATA:  Feeding tube placement. EXAM: PORTABLE ABDOMEN - 1 VIEW COMPARISON:  Chest x-ray 04/04/2016.  CT 03/26/2016 . FINDINGS: Feeding tube noted with tip projected over the distal stomach/proximal duodenum. No bowel distention. Aortoiliac atherosclerotic vascular calcification. IMPRESSION: Feeding tube noted with its tip projected over the stomach/proximal duodenum. No bowel distention. Electronically Signed   By: Marcello Moores  Register   On: 03/17/2016 13:36  STUDIES:    CULTURES: Sputum 3/19 >>  ANTIBIOTICS: Acyclovir 3/8 >>  Tressie Ellis 3/18 x1 Cefuroxime 3/19 >>  SIGNIFICANT EVENTS: 3/15  Admit for CABG x2, intraop bleeding due to aortic adhesions  LINES/TUBES: ETT 3/15 >>  IABP 3/15 >>  R Rad Aline 3/15 >>  R IJ Swan 3/15 >>   DISCUSSION: 71 y/o M admitted for re-do CABG x2, complicated by aortic adhesions with intraoperative bleeding returned to ICU on vent with open chest / VAC, IABP and hemodynamic instability.   ASSESSMENT / PLAN:  PULMONARY A: Acute Hypoxic Respiratory Failure  ARDS - PaO2/FiO2 ratio compatible with moderate ARDS OSA P:   PRVC 8 cc/kg  Wean PEEP / FiO2 for sats > 92% Daily CXR  PRN albuterol ETT changed over bronch per Dr. 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 with Intraoperative Bleeding Acute RV Systolic Heart Failure - postoperative Hx of Syncope thought related to Ischemic Cardiomyopathy Hx of HTN, HLD P:  Vasoactive gtt's per CVTS Post-operative care per CVTS Continue lasix gtt Monitor I/O's closely  Wound VAC in place, will be returning to OR once stabilized Continue ASA, lipitor  RENAL A:   Acute on Chronic Renal Failure  P:   Trend BMP / urinary output Replace electrolytes as indicated Avoid nephrotoxic agents, ensure adequate renal perfusion   GASTROINTESTINAL A:   At Risk Malnutrition  P:   NPO  SUP: PPI  TF per nutrition  Colace  HEMATOLOGIC A:   Acute Blood Loss Anemia  Hx Multiple Myeloma s/p Bone Marrow Transplant on Maintenance Chemotherapy P:  Trend CBC Monitor for bleeding Transfuse for Hbg <7  INFECTIOUS A:   Open Chest Wound  P:   Monitor for infection  Trend CBC/WBC, fever curve  ENDOCRINE A:   DM II    Hypothyroidism  P:   SSI  Continue synthroid  NEUROLOGIC A:   Seizure Disorder - acute seizures vs myoclonus  Peripheral Neuropathy P:   RASS goal: -3 to -4 Seizure precautions  Continuous EEG Continue keppra, vimpat, valproate, neurontin Neurology following  FAMILY  - Updates: No family at bedside 3/19.    - Inter-disciplinary family meet or Palliative Care meeting due by: 3/26   NP CC Time: 30 minutes   Noe Gens, NP-C [facility] Pgr: [phone] or if no answer [phone] 03/26/2016, 2:52 PM  Attending Note:  71 year old male with extensive PMH who presented for a CABG whose aorta was adhered to the sternum and during sternotomy the patient's aorta was torn and patient suffered a cardiac arrest.  Patient was resuscitated and blood products were given.  Procedure was complete and aorta repaired.  Patient was brought out to the ICU but developed ARDS and PCCM was consulted.  ARDS likely a combination of chest trauma, TRALI or pulmonary edema.   PCCM was consulted for vent management.  Patient remains on high dose pressors as well.  During exam, the patient was noted to be loosing Tv and bubbles were eminating from his mouth.  Concern for extubation.  Bronchoscopy performed, ETT in good position but cuff was punctured.  ETT was changed with adequate recovery.  Patient desaturated but did respond to a recruitment maneuver.  Given that response, increased PEEP to 14 and patient's O2 sat improved from 88 to 97%.  Ideally would like to diurese but patient is too hypotensive for that.  Will increase RR for to  address acidosis.  Renal function also deteriorating will need to discuss CRRT.  Patient's sister wishes for all interventions at this time.  The patient is critically ill with multiple organ systems failure and requires high complexity decision making for assessment and support, frequent evaluation and titration of therapies, application of advanced monitoring technologies and extensive interpretation of multiple databases.   Critical Care Time devoted to patient care services described in this note is  45  Minutes. This time reflects time of care of this signee Dr Jennet Maduro. This critical care time does not reflect procedure time, or teaching time or supervisory time of PA/NP/Med student/Med Resident etc but could involve care discussion time.  Rush Farmer, M.D. [facility] Pulmonary/Critical Care Medicine. Pager: [phone]. After hours pager: [phone].

## 2016-04-01 NOTE — Progress Notes (Signed)
EVENING ROUNDS NOTE :     [address]       [place] [address]             [phone]                 3 Days Post-Op Procedure(s) (LRB): STERNAL WOUND IRRIGATION (N/A) WOUND VAC CHANGE (N/A)  Total Length of Stay:  LOS: 10 days  BP 117/64   Pulse 90   Temp (!) 96.8 F (36 C)   Resp (!) 26   Ht 5\' 5"  (1.651 m)   Wt 212 lb 11.9 oz (96.5 kg)   SpO2 97%   BMI 35.40 kg/m   .Intake/Output      03/19 0701 - 03/20 0700   I.V. (mL/kg) 2344.5 (24.3)   Blood 230   NG/GT 86.5   IV Piggyback 165   Total Intake(mL/kg) 2826 (29.3)   Urine (mL/kg/hr) 1250 (0.8)   Emesis/NG output 600 (0.4)   Drains 25 (0)   Chest Tube 280 (0.2)   Total Output 2155   Net +671         . sodium chloride 10 mL/hr at 04/05/2016 1900  . sodium chloride 250 mL (03/19/2016 0522)  . sodium chloride 10 mL/hr at 03/17/2016 1900  . EPINEPHrine 4 mg in dextrose 5% 250 mL infusion (16 mcg/mL) 20 mcg/min (03/21/2016 2145)  . furosemide (LASIX) infusion 15 mg/hr (03/14/2016 2100)  . lactated ringers Stopped (03/30/2016 0800)  . lactated ringers 10 mL/hr at 03/21/2016 1900  . midazolam (VERSED) infusion Stopped (03/21/2016 0900)  . milrinone 0.375 mcg/kg/min (04/02/2016 2100)  . norepinephrine (LEVOPHED) Adult infusion 6 mcg/min (03/22/2016 2100)  . propofol (DIPRIVAN) infusion 10 mcg/kg/min (03/26/2016 2100)  . vasopressin (PITRESSIN) infusion - *FOR SHOCK* Stopped (03/31/2016 1815)     Lab Results  Component Value Date   WBC 8.5 03/30/2016   HGB 10.2 (L) 03/24/2016   HCT 30.0 (L) 03/27/2016   PLT 57 (L) 03/24/2016   GLUCOSE 129 (H) 04/05/2016   CHOL 68 04/11/2016   TRIG 97 03/30/2016   HDL 31 (L) 03/26/2016   LDLCALC 23 03/20/2016   ALT 48 04/04/2016   AST 72 (H) 03/30/2016   NA 131 (L) 03/24/2016   K 3.7 03/18/2016   CL 92 (L) 04/06/2016   CREATININE 3.40 (H) 04/10/2016   BUN 30 (H) 04/02/2016   CO2 23 03/16/2016   TSH 5.763 (H) 03/19/2016   INR 1.23 04/04/2016   HGBA1C 8.4 (H) 03/27/2016    Unresponsive, but BP up on decreasing levaphed  Douglas Isaac MD  Beeper [phone] Office [phone] 03/31/2016 10:59 PM

## 2016-04-01 NOTE — Anesthesia Postprocedure Evaluation (Signed)
Anesthesia Post Note  Patient: Douglas Mccoy  Procedure(s) Performed: Procedure(s) (LRB): REDO CORONARY ARTERY BYPASS GRAFTING x 2 -SVG to OM -SVG to PDA, ENDOSCOPIC HARVEST GREATER SAPHENOUS VEIN -Left Leg AXILLARY CANNULATION, HYPOTHERMIC CIRCULATORY ARREST, REPAIR OF ASCENDING AORTIC INJURY -28 Hemashield Graft, Insertion of Balloon Pump (N/A) TRANSESOPHAGEAL ECHOCARDIOGRAM (TEE) (N/A)  Patient location during evaluation: SICU Anesthesia Type: General Level of consciousness: sedated Pain management: pain level controlled Vital Signs Assessment: post-procedure vital signs reviewed and stable Respiratory status: patient remains intubated per anesthesia plan Cardiovascular status: stable Anesthetic complications: no Comments: Taken to ICU, patient critically ill, in asystole/PEA for 40 minutes before starting CPB. Multiple high dose pressors running. Patient will require massive transfusion protocol most likely for DIC       Last Vitals:  Vitals:   03/20/2016 1900 04/11/2016 1915  BP: (!) 108/55   Pulse:    Resp: (!) 26 (!) 26  Temp: 37.2 C 37.2 C    Last Pain:  Vitals:   03/17/2016 1915  TempSrc: Core (Comment)  PainSc:                  Douglas Mccoy S

## 2016-04-01 NOTE — Progress Notes (Signed)
PVT updated about 1600 CO/CI, previous measurements, and current drip amounts. Orders received to wean Vasopressin to off and utilize Levo for BP to maintain MAP goal >65. Will continue to monitor pt closely.

## 2016-04-02 ENCOUNTER — Inpatient Hospital Stay (HOSPITAL_COMMUNITY): Payer: Medicare Other | Admitting: Certified Registered"

## 2016-04-02 ENCOUNTER — Encounter (HOSPITAL_COMMUNITY): Payer: Self-pay | Admitting: Certified Registered"

## 2016-04-02 ENCOUNTER — Encounter (HOSPITAL_COMMUNITY): Admission: EM | Disposition: E | Payer: Self-pay | Source: Home / Self Care | Attending: Internal Medicine

## 2016-04-02 ENCOUNTER — Inpatient Hospital Stay (HOSPITAL_COMMUNITY): Payer: Medicare Other

## 2016-04-02 DIAGNOSIS — G9340 Encephalopathy, unspecified: Secondary | ICD-10-CM

## 2016-04-02 DIAGNOSIS — J9601 Acute respiratory failure with hypoxia: Secondary | ICD-10-CM

## 2016-04-02 DIAGNOSIS — J80 Acute respiratory distress syndrome: Secondary | ICD-10-CM

## 2016-04-02 HISTORY — PX: STERNAL WOUND DEBRIDEMENT: SHX1058

## 2016-04-02 HISTORY — PX: APPLICATION OF WOUND VAC: SHX5189

## 2016-04-02 HISTORY — PX: STERNAL CLOSURE: SHX6203

## 2016-04-02 LAB — POCT I-STAT, CHEM 8
BUN: 45 mg/dL — ABNORMAL HIGH (ref 6–20)
CREATININE: 3.5 mg/dL — AB (ref 0.61–1.24)
Calcium, Ion: 0.84 mmol/L — CL (ref 1.15–1.40)
Chloride: 92 mmol/L — ABNORMAL LOW (ref 101–111)
GLUCOSE: 104 mg/dL — AB (ref 65–99)
HEMATOCRIT: 25 % — AB (ref 39.0–52.0)
HEMOGLOBIN: 8.5 g/dL — AB (ref 13.0–17.0)
Potassium: 3.8 mmol/L (ref 3.5–5.1)
Sodium: 131 mmol/L — ABNORMAL LOW (ref 135–145)
TCO2: 23 mmol/L (ref 0–100)

## 2016-04-02 LAB — POCT I-STAT 3, ART BLOOD GAS (G3+)
ACID-BASE DEFICIT: 2 mmol/L (ref 0.0–2.0)
Acid-base deficit: 4 mmol/L — ABNORMAL HIGH (ref 0.0–2.0)
BICARBONATE: 22.9 mmol/L (ref 20.0–28.0)
Bicarbonate: 21.4 mmol/L (ref 20.0–28.0)
O2 SAT: 94 %
O2 Saturation: 98 %
PH ART: 7.372 (ref 7.350–7.450)
TCO2: 23 mmol/L (ref 0–100)
TCO2: 24 mmol/L (ref 0–100)
pCO2 arterial: 37.8 mmHg (ref 32.0–48.0)
pCO2 arterial: 39.4 mmHg (ref 32.0–48.0)
pH, Arterial: 7.357 (ref 7.350–7.450)
pO2, Arterial: 68 mmHg — ABNORMAL LOW (ref 83.0–108.0)
pO2, Arterial: 111 mmHg — ABNORMAL HIGH (ref 83.0–108.0)

## 2016-04-02 LAB — COMPREHENSIVE METABOLIC PANEL
ALT: 40 U/L (ref 17–63)
AST: 65 U/L — ABNORMAL HIGH (ref 15–41)
Albumin: 2.2 g/dL — ABNORMAL LOW (ref 3.5–5.0)
Alkaline Phosphatase: 86 U/L (ref 38–126)
Anion gap: 16 — ABNORMAL HIGH (ref 5–15)
BUN: 35 mg/dL — ABNORMAL HIGH (ref 6–20)
CO2: 21 mmol/L — ABNORMAL LOW (ref 22–32)
Calcium: 7.2 mg/dL — ABNORMAL LOW (ref 8.9–10.3)
Chloride: 92 mmol/L — ABNORMAL LOW (ref 101–111)
Creatinine, Ser: 3.22 mg/dL — ABNORMAL HIGH (ref 0.61–1.24)
GFR calc Af Amer: 21 mL/min — ABNORMAL LOW (ref >60)
GFR calc non Af Amer: 18 mL/min — ABNORMAL LOW (ref >60)
Glucose, Bld: 141 mg/dL — ABNORMAL HIGH (ref 65–99)
Potassium: 3.7 mmol/L (ref 3.5–5.1)
Sodium: 129 mmol/L — ABNORMAL LOW (ref 135–145)
Total Bilirubin: 1 mg/dL (ref 0.3–1.2)
Total Protein: 4.5 g/dL — ABNORMAL LOW (ref 6.5–8.1)

## 2016-04-02 LAB — BPAM PLATELET PHERESIS
Blood Product Expiration Date: 201803202359
ISSUE DATE / TIME: 201803191159
Unit Type and Rh: 6200

## 2016-04-02 LAB — GLUCOSE, CAPILLARY
GLUCOSE-CAPILLARY: 104 mg/dL — AB (ref 65–99)
GLUCOSE-CAPILLARY: 107 mg/dL — AB (ref 65–99)
Glucose-Capillary: 147 mg/dL — ABNORMAL HIGH (ref 65–99)
Glucose-Capillary: 159 mg/dL — ABNORMAL HIGH (ref 65–99)

## 2016-04-02 LAB — COOXEMETRY PANEL
Carboxyhemoglobin: 0.9 % (ref 0.5–1.5)
Carboxyhemoglobin: 1.3 % (ref 0.5–1.5)
Methemoglobin: 1.5 % (ref 0.0–1.5)
Methemoglobin: 1.2 % (ref 0.0–1.5)
O2 Saturation: 69.8 %
O2 Saturation: 52.6 %
Total hemoglobin: 9.9 g/dL — ABNORMAL LOW (ref 12.0–16.0)
Total hemoglobin: 12.3 g/dL (ref 12.0–16.0)

## 2016-04-02 LAB — POCT I-STAT 7, (LYTES, BLD GAS, ICA,H+H)
Acid-base deficit: 4 mmol/L — ABNORMAL HIGH (ref 0.0–2.0)
Bicarbonate: 21.7 mmol/L (ref 20.0–28.0)
Calcium, Ion: 0.92 mmol/L — ABNORMAL LOW (ref 1.15–1.40)
HEMATOCRIT: 30 % — AB (ref 39.0–52.0)
HEMOGLOBIN: 10.2 g/dL — AB (ref 13.0–17.0)
O2 Saturation: 98 %
PCO2 ART: 39.1 mmHg (ref 32.0–48.0)
POTASSIUM: 3.6 mmol/L (ref 3.5–5.1)
Sodium: 129 mmol/L — ABNORMAL LOW (ref 135–145)
TCO2: 23 mmol/L (ref 0–100)
pH, Arterial: 7.351 (ref 7.350–7.450)
pO2, Arterial: 104 mmHg (ref 83.0–108.0)

## 2016-04-02 LAB — PREPARE PLATELET PHERESIS: Unit division: 0

## 2016-04-02 LAB — CBC
HCT: 28.1 % — ABNORMAL LOW (ref 39.0–52.0)
Hemoglobin: 9.7 g/dL — ABNORMAL LOW (ref 13.0–17.0)
MCH: 27.4 pg (ref 26.0–34.0)
MCHC: 34.5 g/dL (ref 30.0–36.0)
MCV: 79.4 fL (ref 78.0–100.0)
Platelets: 83 10*3/uL — ABNORMAL LOW (ref 150–400)
RBC: 3.54 MIL/uL — ABNORMAL LOW (ref 4.22–5.81)
RDW: 16.1 % — ABNORMAL HIGH (ref 11.5–15.5)
WBC: 6.8 10*3/uL (ref 4.0–10.5)

## 2016-04-02 LAB — PREPARE RBC (CROSSMATCH)

## 2016-04-02 LAB — MAGNESIUM: Magnesium: 1.7 mg/dL (ref 1.7–2.4)

## 2016-04-02 SURGERY — DEBRIDEMENT, WOUND, STERNUM
Anesthesia: General | Site: Chest

## 2016-04-02 MED ORDER — AMIODARONE HCL IN DEXTROSE 360-4.14 MG/200ML-% IV SOLN
30.0000 mg/h | INTRAVENOUS | Status: DC
  Administered 2016-04-03: 30 mg/h via INTRAVENOUS
  Filled 2016-04-02: qty 200

## 2016-04-02 MED ORDER — LEVALBUTEROL HCL 1.25 MG/0.5ML IN NEBU
1.2500 mg | INHALATION_SOLUTION | Freq: Four times a day (QID) | RESPIRATORY_TRACT | Status: DC
  Administered 2016-04-03 (×2): 1.25 mg via RESPIRATORY_TRACT
  Filled 2016-04-02 (×2): qty 0.5

## 2016-04-02 MED ORDER — AMIODARONE HCL IN DEXTROSE 360-4.14 MG/200ML-% IV SOLN
INTRAVENOUS | Status: AC
  Administered 2016-04-02: 60 mg/h
  Filled 2016-04-02: qty 200

## 2016-04-02 MED ORDER — VANCOMYCIN HCL IN DEXTROSE 750-5 MG/150ML-% IV SOLN
750.0000 mg | Freq: Once | INTRAVENOUS | Status: AC
  Administered 2016-04-02: 750 mg via INTRAVENOUS
  Filled 2016-04-02: qty 150

## 2016-04-02 MED ORDER — VECURONIUM BROMIDE 10 MG IV SOLR
INTRAVENOUS | Status: AC
  Filled 2016-04-02: qty 10

## 2016-04-02 MED ORDER — SODIUM BICARBONATE 8.4 % IV SOLN
INTRAVENOUS | Status: DC | PRN
  Administered 2016-04-02: 50 meq via INTRAVENOUS

## 2016-04-02 MED ORDER — NEPRO/CARBSTEADY PO LIQD
1000.0000 mL | ORAL | Status: DC
  Administered 2016-04-02 (×2): 1000 mL via ORAL
  Filled 2016-04-02 (×3): qty 1000

## 2016-04-02 MED ORDER — MIDAZOLAM HCL 2 MG/2ML IJ SOLN
INTRAMUSCULAR | Status: AC
  Filled 2016-04-02: qty 2

## 2016-04-02 MED ORDER — AMIODARONE HCL IN DEXTROSE 360-4.14 MG/200ML-% IV SOLN
60.0000 mg/h | INTRAVENOUS | Status: AC
  Administered 2016-04-02 (×2): 60 mg/h via INTRAVENOUS

## 2016-04-02 MED ORDER — SODIUM BICARBONATE 8.4 % IV SOLN
INTRAVENOUS | Status: AC
  Filled 2016-04-02: qty 50

## 2016-04-02 MED ORDER — VANCOMYCIN HCL 1000 MG IV SOLR
INTRAVENOUS | Status: AC
  Administered 2016-04-02: 1000 mL
  Filled 2016-04-02: qty 1000

## 2016-04-02 MED ORDER — SUFENTANIL CITRATE 50 MCG/ML IV SOLN
INTRAVENOUS | Status: AC
  Filled 2016-04-02: qty 1

## 2016-04-02 MED ORDER — SUFENTANIL CITRATE 50 MCG/ML IV SOLN
INTRAVENOUS | Status: DC | PRN
  Administered 2016-04-02: 10 ug via INTRAVENOUS

## 2016-04-02 MED ORDER — SODIUM CHLORIDE 0.9 % IR SOLN
Status: DC | PRN
  Administered 2016-04-02: 1

## 2016-04-02 MED ORDER — VECURONIUM BROMIDE 10 MG IV SOLR
INTRAVENOUS | Status: DC | PRN
  Administered 2016-04-02: 3 mg via INTRAVENOUS
  Administered 2016-04-02: 2 mg via INTRAVENOUS

## 2016-04-02 MED ORDER — SODIUM CHLORIDE 0.9 % IJ SOLN
OROMUCOSAL | Status: DC | PRN
  Administered 2016-04-02 (×2): 1 mL via TOPICAL

## 2016-04-02 MED ORDER — AMIODARONE LOAD VIA INFUSION
150.0000 mg | Freq: Once | INTRAVENOUS | Status: AC
  Administered 2016-04-02: 150 mg via INTRAVENOUS
  Filled 2016-04-02: qty 83.34

## 2016-04-02 MED ORDER — PROPOFOL 10 MG/ML IV BOLUS
INTRAVENOUS | Status: AC
  Filled 2016-04-02: qty 20

## 2016-04-02 SURGICAL SUPPLY — 73 items
ATTRACTOMAT 16X20 MAGNETIC DRP (DRAPES) IMPLANT
BAG DECANTER FOR FLEXI CONT (MISCELLANEOUS) ×4 IMPLANT
BENZOIN TINCTURE PRP APPL 2/3 (GAUZE/BANDAGES/DRESSINGS) ×4 IMPLANT
BLADE SAW SAG 29X58X.64 (BLADE) IMPLANT
BLADE SURG 10 STRL SS (BLADE) IMPLANT
BLADE SURG 15 STRL LF DISP TIS (BLADE) IMPLANT
BLADE SURG 15 STRL SS (BLADE)
BNDG GAUZE ELAST 4 BULKY (GAUZE/BANDAGES/DRESSINGS) IMPLANT
CANISTER SUCT 3000ML PPV (MISCELLANEOUS) ×4 IMPLANT
CATH FOLEY 2WAY SLVR  5CC 16FR (CATHETERS)
CATH FOLEY 2WAY SLVR 5CC 16FR (CATHETERS) IMPLANT
CATH THORACIC 28FR RT ANG (CATHETERS) IMPLANT
CATH THORACIC 36FR (CATHETERS) IMPLANT
CATH THORACIC 36FR RT ANG (CATHETERS) IMPLANT
CLIP TI WIDE RED SMALL 24 (CLIP) ×4 IMPLANT
CONN Y 3/8X3/8X3/8  BEN (MISCELLANEOUS)
CONN Y 3/8X3/8X3/8 BEN (MISCELLANEOUS) IMPLANT
CONT SPEC 4OZ CLIKSEAL STRL BL (MISCELLANEOUS) IMPLANT
COVER SURGICAL LIGHT HANDLE (MISCELLANEOUS) IMPLANT
DRAPE LAPAROSCOPIC ABDOMINAL (DRAPES) ×4 IMPLANT
DRAPE SLUSH/WARMER DISC (DRAPES) IMPLANT
DRAPE WARM FLUID 44X44 (DRAPE) IMPLANT
DRSG AQUACEL AG ADV 3.5X14 (GAUZE/BANDAGES/DRESSINGS) ×4 IMPLANT
DRSG PAD ABDOMINAL 8X10 ST (GAUZE/BANDAGES/DRESSINGS) IMPLANT
ELECT REM PT RETURN 9FT ADLT (ELECTROSURGICAL) ×4
ELECTRODE REM PT RTRN 9FT ADLT (ELECTROSURGICAL) ×2 IMPLANT
GAUZE SPONGE 4X4 12PLY STRL (GAUZE/BANDAGES/DRESSINGS) IMPLANT
GAUZE SPONGE 4X4 12PLY STRL LF (GAUZE/BANDAGES/DRESSINGS) ×4 IMPLANT
GAUZE XEROFORM 5X9 LF (GAUZE/BANDAGES/DRESSINGS) IMPLANT
GLOVE BIO SURGEON STRL SZ7.5 (GLOVE) ×8 IMPLANT
GLOVE BIOGEL PI IND STRL 6 (GLOVE) ×6 IMPLANT
GLOVE BIOGEL PI INDICATOR 6 (GLOVE) ×6
GOWN STRL REUS W/ TWL LRG LVL3 (GOWN DISPOSABLE) ×6 IMPLANT
GOWN STRL REUS W/TWL LRG LVL3 (GOWN DISPOSABLE) ×6
HANDPIECE INTERPULSE COAX TIP (DISPOSABLE)
HEMOSTAT POWDER SURGIFOAM 1G (HEMOSTASIS) ×8 IMPLANT
HEMOSTAT SURGICEL 2X14 (HEMOSTASIS) IMPLANT
KIT BASIN OR (CUSTOM PROCEDURE TRAY) ×4 IMPLANT
KIT ROOM TURNOVER OR (KITS) ×4 IMPLANT
KIT SUCTION CATH 14FR (SUCTIONS) IMPLANT
NS IRRIG 1000ML POUR BTL (IV SOLUTION) ×8 IMPLANT
PACK CHEST (CUSTOM PROCEDURE TRAY) ×4 IMPLANT
PAD ARMBOARD 7.5X6 YLW CONV (MISCELLANEOUS) ×8 IMPLANT
PIN SAFETY STERILE (MISCELLANEOUS) IMPLANT
RUBBERBAND STERILE (MISCELLANEOUS) IMPLANT
SET HNDPC FAN SPRY TIP SCT (DISPOSABLE) IMPLANT
SOLUTION BETADINE 4OZ (MISCELLANEOUS) IMPLANT
SPONGE LAP 18X18 X RAY DECT (DISPOSABLE) ×4 IMPLANT
STAPLER VISISTAT 35W (STAPLE) ×4 IMPLANT
STRAP MONTGOMERY 1.25X11-1/8 (MISCELLANEOUS) IMPLANT
SUT ETHILON 2 0 FS 18 (SUTURE) ×12 IMPLANT
SUT ETHILON 3 0 FSL (SUTURE) IMPLANT
SUT ETHILON 3 0 PS 1 (SUTURE) ×8 IMPLANT
SUT SILK  1 MH (SUTURE)
SUT SILK 1 MH (SUTURE) IMPLANT
SUT STEEL 6MS V (SUTURE) IMPLANT
SUT STEEL STERNAL CCS#1 18IN (SUTURE) IMPLANT
SUT STEEL SZ 6 DBL 3X14 BALL (SUTURE) IMPLANT
SUT STERNA BAND STERNOTOMY (SUTURE) IMPLANT
SUT VIC AB 1 CTX 18 (SUTURE) ×12 IMPLANT
SUT VIC AB 1 CTX 36 (SUTURE) ×4
SUT VIC AB 1 CTX36XBRD ANBCTR (SUTURE) ×4 IMPLANT
SUT VIC AB 2-0 CTX 27 (SUTURE) ×12 IMPLANT
SUT VIC AB 3-0 X1 27 (SUTURE) ×8 IMPLANT
SWAB COLLECTION DEVICE MRSA (MISCELLANEOUS) ×4 IMPLANT
SYR 5ML LL (SYRINGE) IMPLANT
SYSTEM SAHARA CHEST DRAIN ATS (WOUND CARE) IMPLANT
TAPE CLOTH SURG 4X10 WHT LF (GAUZE/BANDAGES/DRESSINGS) ×4 IMPLANT
TOWEL OR 17X24 6PK STRL BLUE (TOWEL DISPOSABLE) ×4 IMPLANT
TOWEL OR 17X26 10 PK STRL BLUE (TOWEL DISPOSABLE) IMPLANT
TRAY FOLEY W/METER SILVER 16FR (SET/KITS/TRAYS/PACK) IMPLANT
TUBE ANAEROBIC SPECIMEN COL (MISCELLANEOUS) IMPLANT
WATER STERILE IRR 1000ML POUR (IV SOLUTION) ×4 IMPLANT

## 2016-04-02 NOTE — Anesthesia Postprocedure Evaluation (Signed)
Anesthesia Post Note  Patient: Douglas Mccoy  Procedure(s) Performed: Procedure(s) (LRB): STERNAL WOUND DEBRIDEMENT (N/A) REMOVAL OF WOUND VAC (N/A) WOUND CLOSURE (N/A)  Patient location during evaluation: SICU Anesthesia Type: General Level of consciousness: sedated Pain management: pain level controlled Vital Signs Assessment: post-procedure vital signs reviewed and stable Respiratory status: patient remains intubated per anesthesia plan Cardiovascular status: stable Anesthetic complications: no       Last Vitals:  Vitals:   03/20/2016 1222 03/27/2016 1223  BP:    Pulse:    Resp: (!) 0 (!) 0  Temp: 36.6 C 36.6 C    Last Pain:  Vitals:   04/02/16 0800  TempSrc: Core (Comment)  PainSc:                  [name]

## 2016-04-02 NOTE — Transfer of Care (Signed)
Immediate Anesthesia Transfer of Care Note  Patient: Douglas Mccoy  Procedure(s) Performed: Procedure(s): STERNAL WOUND DEBRIDEMENT (N/A) REMOVAL OF WOUND VAC (N/A) WOUND CLOSURE (N/A)  Patient Location: ICU  Anesthesia Type:General  Level of Consciousness: unresponsive and Patient remains intubated per anesthesia plan  Airway & Oxygen Therapy: Patient remains intubated per anesthesia plan and Patient placed on Ventilator (see vital sign flow sheet for setting)  Post-op Assessment: Report given to RN and Post -op Vital signs reviewed and stable  Post vital signs: Reviewed and stable  Last Vitals:  Vitals:   03/30/2016 0744 04/09/2016 0800  BP:  110/69  Pulse:  80  Resp: (!) 23 (!) 32  Temp: 36.9 C 36.9 C    Last Pain:  Vitals:   04/06/2016 0800  TempSrc: Core (Comment)  PainSc:          Complications: No apparent anesthesia complications

## 2016-04-02 NOTE — Progress Notes (Signed)
Neurology Progress Note  Subjective: The patient remains critically ill in the ICU on 2 pressors. He remains ventilator dependent. He was taken back to the operating room this morning for irrigation and closure of his sternal wound. He has not had any further clinical seizure activity reported. EEG continued to show burst suppression and this was discontinued earlier today. He is now off of Vimpat, which was stopped due to potential cardiovascular side effects. The patient is unable to participate in view of systems because he remains comatose. He is presently off all sedation.  Medications reviewed and reconciled.   Pertinent meds: Versed infusion stopped at 0900 on 03/18/2016 Propofol infusion stopped at 0810 on 04/06/2016 Epinephrine infusion 15 mcg/minute Furosemide infusion 15 mg/h Milrinone infusion 0.375 mcg/kg/m Norepinephrine infusion 3 mcg/minute Depacon 500 mg IV every 8 hours Keppra 1500 mg IV every 24 hours Gabapentin 600 mg twice daily  Current Meds:   Current Facility-Administered Medications:  .  0.45 % sodium chloride infusion, , Intravenous, Continuous PRN, Erin R Barrett, PA-C, Last Rate: 10 mL/hr at 03/18/2016 1500 .  0.9 %  sodium chloride infusion, 250 mL, Intravenous, Continuous, Erin R Barrett, PA-C, Last Rate: 1 mL/hr at 04/08/2016 0522, 250 mL at 04/04/2016 0522 .  0.9 %  sodium chloride infusion, , Intravenous, Continuous, Erin R Barrett, PA-C, Last Rate: 20 mL/hr at 04/08/2016 1500 .  0.9 %  sodium chloride infusion, , Intravenous, Once, Ivin Poot, MD .  0.9 %  sodium chloride infusion, , Intravenous, Once, Ivin Poot, MD .  0.9 %  sodium chloride infusion, , Intravenous, Once, Laretta Alstrom, CRNA .  acetaminophen (TYLENOL) tablet 1,000 mg, 1,000 mg, Oral, Q6H **OR** acetaminophen (TYLENOL) solution 1,000 mg, 1,000 mg, Per Tube, Q6H, Erin R Barrett, PA-C, 1,000 mg at 03/31/2016 1320 .  acetaminophen (TYLENOL) solution 650 mg, 650 mg, Per Tube, Once **OR**  acetaminophen (TYLENOL) suppository 650 mg, 650 mg, Rectal, Once, Erin R Barrett, PA-C .  acyclovir (ZOVIRAX) 200 MG capsule 200 mg, 200 mg, Oral, BID, Lezlie Octave Black, NP, 200 mg at 04/11/2016 2210 .  albuterol (PROVENTIL) (2.5 MG/3ML) 0.083% nebulizer solution 2.5 mg, 2.5 mg, Inhalation, Q6H PRN, Jonetta Osgood, MD .  aspirin EC tablet 325 mg, 325 mg, Oral, Daily **OR** aspirin chewable tablet 324 mg, 324 mg, Per Tube, Daily, Erin R Barrett, PA-C, 324 mg at 03/31/16 1000 .  atorvastatin (LIPITOR) tablet 80 mg, 80 mg, Oral, q1800, Eileen Stanford, PA-C, 80 mg at 03/31/16 1806 .  bisacodyl (DULCOLAX) EC tablet 10 mg, 10 mg, Oral, Daily **OR** bisacodyl (DULCOLAX) suppository 10 mg, 10 mg, Rectal, Daily, Erin R Barrett, PA-C, 10 mg at 03/27/2016 1045 .  cefTAZidime (FORTAZ) 2 g in dextrose 5 % 50 mL IVPB, 2 g, Intravenous, Q24H, Ivin Poot, MD, 2 g at 03/22/2016 1815 .  chlorhexidine gluconate (MEDLINE KIT) (PERIDEX) 0.12 % solution 15 mL, 15 mL, Mouth Rinse, BID, Ivin Poot, MD, 15 mL at 04/12/2016 2000 .  Chlorhexidine Gluconate Cloth 2 % PADS 6 each, 6 each, Topical, Daily, Ivin Poot, MD, 6 each at 04/08/2016 1000 .  docusate sodium (COLACE) capsule 200 mg, 200 mg, Oral, Daily, Erin R Barrett, PA-C .  EPINEPHrine (ADRENALIN) 4 mg in dextrose 5 % 250 mL (0.016 mg/mL) infusion, 0-20 mcg/min, Intravenous, Titrated, Ivin Poot, MD, Last Rate: 56.3 mL/hr at 03/26/2016 1501, 15 mcg/min at 03/30/2016 1501 .  feeding supplement (NEPRO CARB STEADY) liquid 1,000 mL, 1,000 mL, Oral,  Q24H, Ivin Poot, MD, Last Rate: 20 mL/hr at 03/20/2016 1502, 1,000 mL at 03/31/2016 1502 .  fluticasone (FLONASE) 50 MCG/ACT nasal spray 2 spray, 2 spray, Each Nare, Daily PRN, Radene Gunning, NP .  furosemide (LASIX) 250 mg in dextrose 5 % 250 mL (1 mg/mL) infusion, 15 mg/hr, Intravenous, Continuous, Melrose Nakayama, MD, Last Rate: 15 mL/hr at 04/07/2016 1500, 15 mg/hr at 03/14/2016 1500 .  gabapentin (NEURONTIN)  tablet 600 mg, 600 mg, Oral, BID, Jonetta Osgood, MD, 600 mg at 03/23/2016 2210 .  insulin aspart (novoLOG) injection 0-24 Units, 0-24 Units, Subcutaneous, Q4H, Ivin Poot, MD, Stopped at 03/30/2016 0800 .  insulin detemir (LEVEMIR) injection 15 Units, 15 Units, Subcutaneous, BID, Melrose Nakayama, MD, 15 Units at 03/20/2016 2210 .  lacosamide (VIMPAT) 200 mg in sodium chloride 0.9 % 25 mL IVPB, 200 mg, Intravenous, Q12H, Sallyanne Havers, MD, Stopped at 03/27/2016 1000 .  lactated ringers infusion, , Intravenous, Continuous, Erin R Barrett, PA-C, Stopped at 03/27/2016 0800 .  lactated ringers infusion, , Intravenous, Continuous, Erin R Barrett, PA-C, Last Rate: 10 mL/hr at 03/17/2016 1500 .  levalbuterol (XOPENEX) nebulizer solution 1.25 mg, 1.25 mg, Nebulization, Q6H, Ivin Poot, MD, 1.25 mg at 03/16/2016 1435 .  levETIRAcetam (KEPPRA) 1,500 mg in sodium chloride 0.9 % 100 mL IVPB, 1,500 mg, Intravenous, Q24H, Sallyanne Havers, MD, 1,500 mg at 03/31/2016 0930 .  levothyroxine (SYNTHROID, LEVOTHROID) tablet 50 mcg, 50 mcg, Oral, QAC breakfast, Lezlie Octave Black, NP, 50 mcg at 03/31/16 0800 .  LORazepam (ATIVAN) injection 1 mg, 1 mg, Intravenous, Q4H PRN, Melrose Nakayama, MD, 1 mg at 03/30/16 3500 .  MEDLINE mouth rinse, 15 mL, Mouth Rinse, QID, Ivin Poot, MD, 15 mL at 04/10/2016 1047 .  methylPREDNISolone sodium succinate (SOLU-MEDROL) 125 mg/2 mL injection 60 mg, 60 mg, Intravenous, Q12H, Ivin Poot, MD, 60 mg at 04/05/2016 1320 .  metoCLOPramide (REGLAN) injection 10 mg, 10 mg, Intravenous, Q6H, Ivin Poot, MD, 10 mg at 04/05/2016 1320 .  metoprolol (LOPRESSOR) injection 2.5-5 mg, 2.5-5 mg, Intravenous, Q2H PRN, Erin R Barrett, PA-C .  metoprolol tartrate (LOPRESSOR) tablet 12.5 mg, 12.5 mg, Oral, BID **OR** metoprolol tartrate (LOPRESSOR) 25 mg/10 mL oral suspension 12.5 mg, 12.5 mg, Per Tube, BID, Erin R Barrett, PA-C .  midazolam (VERSED) injection 2 mg, 2 mg, Intravenous, Q1H PRN, Erin R Barrett,  PA-C, 2 mg at 03/30/16 1930 .  milrinone (PRIMACOR) 20 MG/100 ML (0.2 mg/mL) infusion, 0.375 mcg/kg/min (Order-Specific), Intravenous, Continuous, Ivin Poot, MD, Last Rate: 7.4 mL/hr at 03/22/2016 1500, 0.375 mcg/kg/min at 03/27/2016 1500 .  morphine 2 MG/ML injection 2-5 mg, 2-5 mg, Intravenous, Q1H PRN, Erin R Barrett, PA-C, 2 mg at 04/11/2016 2237 .  mupirocin ointment (BACTROBAN) 2 % 1 application, 1 application, Nasal, BID, Ivin Poot, MD, 1 application at 93/81/82 2148 .  norepinephrine (LEVOPHED) 16 mg in dextrose 5 % 250 mL (0.064 mg/mL) infusion, 0-40 mcg/min, Intravenous, Titrated, Melrose Nakayama, MD, Last Rate: 2.8 mL/hr at 04/06/2016 1500, 3 mcg/min at 03/17/2016 1500 .  ondansetron (ZOFRAN) injection 4 mg, 4 mg, Intravenous, Q6H PRN, Erin R Barrett, PA-C .  oxyCODONE (Oxy IR/ROXICODONE) immediate release tablet 5-10 mg, 5-10 mg, Oral, Q3H PRN, Erin R Barrett, PA-C .  pantoprazole (PROTONIX) injection 40 mg, 40 mg, Intravenous, Q24H, Ivin Poot, MD, 40 mg at 04/08/2016 1320 .  propofol (DIPRIVAN) 1000 MG/100ML infusion, 5-80 mcg/kg/min, Intravenous, Titrated, Ivin Poot, MD, Stopped at  03/31/2016 0800 .  sertraline (ZOLOFT) tablet 50 mg, 50 mg, Oral, Daily, Lezlie Octave Black, NP, 50 mg at 03/31/16 8413 .  sodium chloride flush (NS) 0.9 % injection 3 mL, 3 mL, Intravenous, Q12H, Erin R Barrett, PA-C, 3 mL at 03/31/16 2134 .  sodium chloride flush (NS) 0.9 % injection 3 mL, 3 mL, Intravenous, PRN, Erin R Barrett, PA-C .  valproate (DEPACON) 500 mg in dextrose 5 % 50 mL IVPB, 500 mg, Intravenous, Q8H, Greta Doom, MD, 500 mg at 04/04/2016 1458 .  vasopressin (PITRESSIN) 40 Units in sodium chloride 0.9 % 250 mL (0.16 Units/mL) infusion, 0.03 Units/min, Intravenous, Continuous, Melrose Nakayama, MD, Stopped at 03/14/2016 1815  Objective:  Temp:  [96.3 F (35.7 C)-99.1 F (37.3 C)] 97.7 F (36.5 C) (03/20 1500) Pulse Rate:  [80] 80 (03/20 1117) Resp:  [0-32] 0 (03/20  1500) BP: (92-131)/(47-85) 126/81 (03/20 1500) SpO2:  [94 %-100 %] 95 % (03/20 1500) Arterial Line BP: (94-174)/(32-55) 140/44 (03/20 1500) FiO2 (%):  [75 %-100 %] 75 % (03/20 1435) Weight:  [97.7 kg (215 lb 6.2 oz)] 97.7 kg (215 lb 6.2 oz) (03/20 0500)  General: WD obese elderly Caucasian man lying in ICU bed. He is intubated on multiple drips, no sedation. There is no eye opening, either spontaneously or with stimulation. He does not follow any commands. He is diffusely edematous. HEENT: Neck is supple without lymphadenopathy. ETT, OGT in place. Right IJ line in place. Sclerae are anicteric with significant edema. There is no conjunctival injection. His tongue is severely edematous and protrudes from his mouth. CV: Regular, distant, no obvious murmur. Carotid pulses are 2+ and symmetric with no bruits.  Lungs: CTAB on anterior exam. Ventilated. Extremities: No cyanosis. Distal pulses 1+. Patchy ecchymosis on the knees. Diffuse edema. Neuro: MS: As noted above.  CN: Pupils are equal and reactive from 3-->2 mm bilaterally. He does not blink to visual threat. His eyes are mildly disconjugate with no forced deviation and no nystagmus. His left corneal appears intact with a markedly diminished response on the right. His face appears to be grossly symmetric but is partly obscured by tubes and tape. No gag with endotracheal tube manipulation. The remainder of his cranial nerves cannot be accurately assessed as he is unable to participate with the examination.  Motor: Normal bulk. He is flaccid throughout. No spontaneous movements of the extremities were observed. No seizure or other abnormal motor activity was seen. Sensation: No response to nailbed pressure 4. No response to supraorbital pressure on either side. No response to central pain. DTRs: 2+, symmetric with the exception of absent ankle jerks bilaterally. Toes are mute bilaterally.   Coordination and gait: These cannot be assessed as the  patient is unable to participate with the examination.   Labs: Lab Results  Component Value Date   WBC 6.8 03/27/2016   HGB 10.2 (L) 03/16/2016   HCT 30.0 (L) 03/30/2016   PLT 83 (L) 04/10/2016   GLUCOSE 141 (H) 04/07/2016   CHOL 68 03/15/2016   TRIG 97 03/30/2016   HDL 31 (L) 04/09/2016   LDLCALC 23 03/31/2016   ALT 40 03/22/2016   AST 65 (H) 03/19/2016   NA 129 (L) 03/20/2016   K 3.6 03/26/2016   CL 92 (L) 04/06/2016   CREATININE 3.22 (H) 03/20/2016   BUN 35 (H) 04/11/2016   CO2 21 (L) 03/17/2016   TSH 5.763 (H) 03/19/2016   INR 1.23 03/31/2016   HGBA1C 8.4 (H) 03/27/2016  CBC Latest Ref Rng & Units 04/13/2016 03/15/2016 03/15/2016  WBC 4.0 - 10.5 K/uL - 6.8 -  Hemoglobin 13.0 - 17.0 g/dL 10.2(L) 9.7(L) 10.2(L)  Hematocrit 39.0 - 52.0 % 30.0(L) 28.1(L) 30.0(L)  Platelets 150 - 400 K/uL - 83(L) -    Lab Results  Component Value Date   HGBA1C 8.4 (H) 03/27/2016   Lab Results  Component Value Date   ALT 40 03/24/2016   AST 65 (H) 03/19/2016   ALKPHOS 86 03/14/2016   BILITOT 1.0 03/22/2016    Radiology:  There is no neuro imaging for review  Other diagnostic studies:  Continuous EEG from 0 733/19/18 02/21/1931/20/18 showed burst suppression with some isolated right frontal sharp discharges seen within the bursts on an intermittent basis. No organized seizure activity was described. The EEG showed no reactivity with poor spontaneous variability.  A/P:   1. Status epilepticus: This is acute, no reported history of seizures. This has resolved. Seizures in this case may have been provoked by relative cerebral hypoperfusion in the setting of bypass surgery. Additional considerations include acute ischemic stroke, intracranial hemorrhage, and anoxic brain injury. Unfortunately, he has not been stable enough to go to radiology for any neuro imaging. If his condition allows, imaging of the brain may be helpful in offering additional prognostic information to assist with  clinical decision making moving forward. He has not had any evidence of return of seizures off of sedation and EEG continued to show burst suppression. As such, continuous EEG has been stopped due to concerns for skin breakdown from electrodes. Vimpat was stopped because of potential cardiac side effects with bradycardia and hypotension.  Continue Keppra and Depacon.   2. Acute encephalopathy: This is most likely multifactorial in etiology with potential contributions from status epilepticus, possible acute CNS insult (infarction, hemorrhage, anoxic brain injury all possible though not clearly substantiated at this time), age, co-morbid illness, severity of medical illness, medications/polypharmacy, metabolic derangement. If he stabilizes enough, neuroimaging would be helpful at some point in order to evaluate for any possible CNS insult and would start with CT scan of the head without contrast. Continue to optimize metabolic status as you are. Continue to treat any potential infection as needed. Minimize the use of opiates, benzos or any medication with strong anticholinergic properties as much as possible.   His neurologic prognosis appears to be poor, particularly in light of his numerous critical medical issues. I will continue to follow along with you. Please call if any urgent questions or concerns arise.  No family was present at the bedside at the time of my visit. Per notes, he has a sister who is been making decisions but he was not been to see the patient during this admission.  This patient is critically ill and at significant risk of neurological worsening, death and care requires constant monitoring of vital signs, hemodynamics,respiratory and cardiac monitoring, neurological assessment, discussion with family, other specialists and medical decision making of high complexity. A total of 30 minutes of critical care time was spent on this case.   Melba Coon, MD [facility]

## 2016-04-02 NOTE — Progress Notes (Signed)
Plan chest irrigation and possible closure on Douglas Mccoy The patient was examined and preop studies reviewed. There has been no change from the prior exam and the patient is ready for surgery.

## 2016-04-02 NOTE — Procedures (Signed)
LTM-EEG Report  HISTORY: Continuous video-EEG monitoring performed for 71 year old with mental status, concern for seizures. ACQUISITION: International 10-20 system for electrode placement; 18 channels with additional eyes linked to ipsilateral ears and EKG. Additional T1-T2 electrodes were used. Continuous video recording obtained.   EEG NUMBER:  MEDICATIONS:  Day 1: VPA, LEV, LCM, midaz, propofol  DAY #1: from 0730 04/01/17 to 0730 04/02/17  BACKGROUND: An overall low voltage, discontinuing was recording consistent with a burst-suppression pattern. There were periods of low voltage depression lasting 5-20 seconds and intervening bursts of low to medium voltage irregular 1-4 Hz activity lasting 1-3 seconds. Sharp discharges in the right frontal regions were seen in the bursts at times.  There were no seizures. There was no evidence of reactivity.  There was poor spontaneous variability. There was EKG/pulse artifact throughout the recording.  EPILEPTIFORM/PERIODIC ACTIVITY: none SEIZURES: none EVENTS: none EKG: no significant arrhythmia  SUMMARY: This was an markedly abnormal continuous video EEG due to a burst-suppression pattern. There were no seizures seen.  Some isolated sharp discharges were seen in the right frontal regions at times.

## 2016-04-02 NOTE — Progress Notes (Addendum)
[facility] EVENING ICU PROGRESS NOTE                   [address]            [place] [address]          [phone]   Day of Surgery Procedure(s) (LRB): STERNAL WOUND DEBRIDEMENT (N/A) REMOVAL OF WOUND VAC (N/A) WOUND CLOSURE (N/A)  Total Length of Stay:  LOS: 11 days   Subjective: Intubated/sedated  Objective: Vital signs in last 24 hours: Temp:  [96.3 F (35.7 C)-99.1 F (37.3 C)] 96.6 F (35.9 C) (03/20 2045) Pulse Rate:  [80-89] 89 (03/20 1836) Cardiac Rhythm: A-V Sequential paced (03/20 1950) Resp:  [0-32] 0 (03/20 2045) BP: (89-131)/(47-91) 128/79 (03/20 2000) SpO2:  [91 %-100 %] 94 % (03/20 2045) Arterial Line BP: (106-174)/(32-60) 157/50 (03/20 2045) FiO2 (%):  [70 %-100 %] 70 % (03/20 2000) Weight:  [97.7 kg (215 lb 6.2 oz)] 97.7 kg (215 lb 6.2 oz) (03/20 0500)  Filed Weights   03/31/16 0600 04/11/2016 0800 03/26/2016 0500  Weight: 90.1 kg (198 lb 10.2 oz) 96.5 kg (212 lb 11.9 oz) 97.7 kg (215 lb 6.2 oz)    Weight change:    Hemodynamic parameters for last 24 hours: PAP: (36-58)/(15-40) 39/18 CVP:  [17 mmHg-37 mmHg] 17 mmHg CO:  [2.8 L/min-3.3 L/min] 3 L/min CI:  [1.6 L/min/m2-1.9 L/min/m2] 1.8 L/min/m2  Intake/Output from previous day: 03/19 0701 - 03/20 0700 In: 4507.9 [I.V.:3796.4; Blood:230; NG/GT:176.5; IV Piggyback:275] Out: 2725 [Urine:1750; Emesis/NG output:600; Drains:25; Chest Tube:350]  Intake/Output this shift: Total I/O In: 480.8 [I.V.:155.8; Blood:305; NG/GT:20] Out: 155 [Urine:135; Chest Tube:20]  Current Meds: Scheduled Meds: . sodium chloride   Intravenous Once  . sodium chloride   Intravenous Once  . sodium chloride   Intravenous Once  . acetaminophen  1,000 mg Oral Q6H   Or  . acetaminophen (TYLENOL) oral liquid 160 mg/5 mL  1,000 mg Per Tube Q6H  . acetaminophen (TYLENOL) oral liquid 160 mg/5 mL  650 mg Per Tube Once   Or  . acetaminophen  650 mg Rectal Once  . acyclovir  200 mg Oral BID  . aspirin EC  325 mg  Oral Daily   Or  . aspirin  324 mg Per Tube Daily  . atorvastatin  80 mg Oral q1800  . bisacodyl  10 mg Oral Daily   Or  . bisacodyl  10 mg Rectal Daily  . cefTAZidime (FORTAZ)  IV  2 g Intravenous Q24H  . chlorhexidine gluconate (MEDLINE KIT)  15 mL Mouth Rinse BID  . Chlorhexidine Gluconate Cloth  6 each Topical Daily  . docusate sodium  200 mg Oral Daily  . feeding supplement (NEPRO CARB STEADY)  1,000 mL Oral Q24H  . gabapentin  600 mg Oral BID  . insulin aspart  0-24 Units Subcutaneous Q4H  . insulin detemir  15 Units Subcutaneous BID  . lacosamide (VIMPAT) IV  200 mg Intravenous Q12H  . [START ON 04/16/16] levalbuterol  1.25 mg Nebulization Q6H  . levETIRAcetam  1,500 mg Intravenous Q24H  . levothyroxine  50 mcg Oral QAC breakfast  . mouth rinse  15 mL Mouth Rinse QID  . methylPREDNISolone (SOLU-MEDROL) injection  60 mg Intravenous Q12H  . metoprolol tartrate  12.5 mg Oral BID   Or  . metoprolol tartrate  12.5 mg Per Tube BID  . mupirocin ointment  1 application Nasal BID  . pantoprazole (PROTONIX) IV  40 mg Intravenous Q24H  .  sertraline  50 mg Oral Daily  . sodium chloride flush  3 mL Intravenous Q12H  . valproate sodium  500 mg Intravenous Q8H   Continuous Infusions: . sodium chloride 10 mL/hr at 03/17/2016 1900  . sodium chloride 250 mL (04/13/2016 0522)  . sodium chloride 20 mL/hr at 03/20/2016 1900  . amiodarone 60 mg/hr (03/31/2016 2027)   Followed by  . amiodarone    . EPINEPHrine 4 mg in dextrose 5% 250 mL infusion (16 mcg/mL) 14 mcg/min (04/04/2016 2015)  . furosemide (LASIX) infusion 15 mg/hr (03/24/2016 2000)  . lactated ringers Stopped (03/14/2016 0800)  . lactated ringers 10 mL/hr at 03/24/2016 1900  . milrinone 0.375 mcg/kg/min (03/17/2016 2000)  . norepinephrine (LEVOPHED) Adult infusion 3 mcg/min (03/24/2016 2000)  . propofol (DIPRIVAN) infusion Stopped (04/10/2016 0800)  . vasopressin (PITRESSIN) infusion - *FOR SHOCK* Stopped (04/04/2016 1815)   PRN Meds:.sodium  chloride, albuterol, fluticasone, LORazepam, metoprolol, midazolam, morphine injection, ondansetron (ZOFRAN) IV, oxyCODONE, sodium chloride flush   Lab Results: CBC: Recent Labs  03/19/2016 0318  04/05/2016 0413 03/30/2016 0908 03/27/2016 1625  WBC 8.5  --  6.8  --   --   HGB 10.2*  < > 9.7* 10.2* 8.5*  HCT 30.4*  < > 28.1* 30.0* 25.0*  PLT 57*  --  83*  --   --   < > = values in this interval not displayed. BMET:  Recent Labs  04/07/2016 0318  03/16/2016 0413 03/26/2016 0908 03/20/2016 1625  NA 133*  < > 129* 129* 131*  K 3.9  < > 3.7 3.6 3.8  CL 97*  < > 92*  --  92*  CO2 23  --  21*  --   --   GLUCOSE 116*  < > 141*  --  104*  BUN 29*  < > 35*  --  45*  CREATININE 2.97*  < > 3.22*  --  3.50*  CALCIUM 6.4*  --  7.2*  --   --   < > = values in this interval not displayed.  CMET: Lab Results  Component Value Date   WBC 6.8 03/20/2016   HGB 8.5 (L) 04/12/2016   HCT 25.0 (L) 03/26/2016   PLT 83 (L) 03/18/2016   GLUCOSE 104 (H) 04/10/2016   CHOL 68 04/09/2016   TRIG 97 03/30/2016   HDL 31 (L) 04/13/2016   LDLCALC 23 03/14/2016   ALT 40 03/18/2016   AST 65 (H) 04/11/2016   NA 131 (L) 04/04/2016   K 3.8 03/25/2016   CL 92 (L) 04/12/2016   CREATININE 3.50 (H) 03/17/2016   BUN 45 (H) 03/27/2016   CO2 21 (L) 04/09/2016   TSH 5.763 (H) 03/19/2016   INR 1.23 04/04/2016   HGBA1C 8.4 (H) 03/27/2016      PT/INR: No results for input(s): LABPROT, INR in the last 72 hours. Radiology: Dg Chest Port 1 View  Result Date: 03/19/2016 CLINICAL DATA:  Post CABG EXAM: PORTABLE CHEST 1 VIEW COMPARISON:  03/14/2016 FINDINGS: Cardiomegaly again noted. Stable endotracheal and NG tube position. Right Swan-Ganz catheter is unchanged in position. Stable chest tubes position. Bilateral streaky airspace disease left greater than right suspicious for asymmetric pulmonary edema. Less likely superimposed pneumonia. Probable trace bilateral pleural effusion. No pneumothorax. IMPRESSION: Stable support  apparatus. No pneumothorax.Stable chest tubes position. Bilateral streaky airspace disease left greater than right suspicious for asymmetric pulmonary edema. Less likely superimposed pneumonia. Probable trace bilateral pleural effusion. Electronically Signed   By: Orlean Bradford.D.  On: 03/24/2016 08:06     Assessment/Plan: S/P Procedure(s) (LRB): STERNAL WOUND DEBRIDEMENT (N/A) REMOVAL OF WOUND VAC (N/A) WOUND CLOSURE (N/A)  Intubated/sedated. + cough earlier per nursing. Hemodynamically stable on Epi 22mg. Norepi 329m, Milrinone 0.37552mkg/min, Lasix 44m45m and Amio 60mg24mne burst of atrial fibrillation earlier however was rate controlled. Now NSR in the 80s. Chest tube drainage tapering. Hemoglobin dropped to 8.5, one unit given. Balloon pump 1:1. +BM.      TessaElgie Collard/2018 9:39 PM

## 2016-04-02 NOTE — Progress Notes (Signed)
Day of Surgery Procedure(s) (LRB): STERNAL WOUND DEBRIDEMENT (N/A) REMOVAL OF WOUND VAC (N/A) WOUND CLOSURE (N/A) Subjective: Patient with marginal improvement in oxygenation and BP pressor requirements since yesterday Creat has stabilized at 3.2, urine 40-60 cc/ hr Pupils equal , reactive but no other response with weaning off sedation After reviewing situation with patients sister Humberto Seals will continue current level of support to allow sedation to wear off- if no neurologic signs of recovery tomorrow (POD #6) will place DNR asnd direct care to comfort measures  patient returned to OR on vent for irrigation of open chest and skin closure of wound. Both bypass grafts patent. RV with moderate dysfunction Objective: Vital signs in last 24 hours: Temp:  [96.3 F (35.7 C)-99.1 F (37.3 C)] 98.4 F (36.9 C) (03/20 0800) Pulse Rate:  [80-90] 80 (03/20 0800) Cardiac Rhythm: A-V Sequential paced (03/20 0800) Resp:  [0-32] 32 (03/20 0800) BP: (92-128)/(53-81) 110/69 (03/20 0800) SpO2:  [81 %-100 %] 97 % (03/20 0800) Arterial Line BP: (92-174)/(32-54) 119/39 (03/20 0744) FiO2 (%):  [75 %-100 %] 75 % (03/20 0800) Weight:  [215 lb 6.2 oz (97.7 kg)] 215 lb 6.2 oz (97.7 kg) (03/20 0500)  Hemodynamic parameters for last 24 hours: PAP: (39-65)/(24-40) 43/25 CVP:  [18 mmHg-37 mmHg] 20 mmHg CO:  [2.8 L/min-3.5 L/min] 3.2 L/min CI:  [1.6 L/min/m2-2 L/min/m2] 1.8 L/min/m2  Intake/Output from previous day: 03/19 0701 - 03/20 0700 In: 4507.9 [I.V.:3796.4; Blood:230; NG/GT:176.5; IV Piggyback:275] Out: 2725 [Urine:1750; Emesis/NG output:600; Drains:25; Chest Tube:350] Intake/Output this shift: Total I/O In: 137.6 [I.V.:137.6] Out: 50 [Blood:50]       Exam    General-edematous, nonresponsive on vent   Lungs- coarse breath sounds   Cor-A-V paced, no murmur , gallop   Abdomen-  Distended soft, non-tender   Extremities - warm, non-tender, severe edema   Neuro- pupils  normal but no response  to stimulus  Lab Results:  Recent Labs  04/07/2016 0318  04/04/2016 0413 03/28/2016 0908  WBC 8.5  --  6.8  --   HGB 10.2*  < > 9.7* 10.2*  HCT 30.4*  < > 28.1* 30.0*  PLT 57*  --  83*  --   < > = values in this interval not displayed. BMET:  Recent Labs  03/30/2016 0318 04/07/2016 1551 04/08/2016 0413 04/04/2016 0908  NA 133* 131* 129* 129*  K 3.9 3.7 3.7 3.6  CL 97* 92* 92*  --   CO2 23  --  21*  --   GLUCOSE 116* 129* 141*  --   BUN 29* 30* 35*  --   CREATININE 2.97* 3.40* 3.22*  --   CALCIUM 6.4*  --  7.2*  --     PT/INR: No results for input(s): LABPROT, INR in the last 72 hours. ABG    Component Value Date/Time   PHART 7.351 03/29/2016 0908   HCO3 21.7 04/10/2016 0908   TCO2 23 04/07/2016 0908   ACIDBASEDEF 4.0 (H) 04/10/2016 0908   O2SAT 98.0 03/14/2016 0908   CBG (last 3)   Recent Labs  04/11/2016 2045 03/22/2016 0012 04/08/2016 0406  GLUCAP 139* 159* 147*    Assessment/Plan: S/P Procedure(s) (LRB): STERNAL WOUND DEBRIDEMENT (N/A) REMOVAL OF WOUND VAC (N/A) WOUND CLOSURE (N/A) Diuresis monitor neuro status with withdrawl of sedation Leave IABP 1:1  augmentation  LOS: 11 days    Tharon Aquas Trigt III 03/22/2016

## 2016-04-02 NOTE — Brief Op Note (Signed)
03/20/2016 - 03/25/2016  10:00 AM  PATIENT:  Douglas Mccoy  71 y.o. male  PRE-OPERATIVE DIAGNOSIS:  OPEN STERNUM, POST REDO CABG  POST-OPERATIVE DIAGNOSIS: OPEN  STERNUM  S/P, POST REDO CABG  PROCEDURE:  Procedure(s): STERNAL WOUND DEBRIDEMENT (N/A) REMOVAL OF WOUND VAC (N/A) WOUND CLOSURE (N/A)  SURGEON:  Surgeon(s) and Role:    * Ivin Poot, MD - Primary  PHYSICIAN ASSISTANT:   ASSISTANTS: none   ANESTHESIA:   general  EBL:  Total I/O In: 137.6 [I.V.:137.6] Out: -   BLOOD ADMINISTERED:none  DRAINS: none   LOCAL MEDICATIONS USED:  NONE  SPECIMEN:  Excision sternal clot  DISPOSITION OF SPECIMEN:  microbiology- wound culture  COUNTS:  correct  TOURNIQUET:  * No tourniquets in log *  DICTATION: .Dragon Dictation  PLAN OF CARE: return to 2 [place] ICU  PATIENT DISPOSITION:  ICU - intubated and critically ill.   Delay start of Pharmacological VTE agent (>24hrs) due to surgical blood loss or risk of bleeding: yes

## 2016-04-02 NOTE — Progress Notes (Signed)
Spoke to Dr. Servando Snare regarding hypertension, current  Drips (Levo 2, Epi 20), and CI. Order received to titrate Epi as needed for BP. Will continue to closely monitor. Richarda Blade RN

## 2016-04-02 NOTE — Op Note (Signed)
NAME:  Douglas Mccoy, Douglas Mccoy                    ACCOUNT NO.:  MEDICAL RECORD NO.:  [id number]  LOCATION:                                 FACILITY:  PHYSICIAN:  Ivin Poot, M.D.  DATE OF BIRTH:  Apr 05, 1945  DATE OF PROCEDURE:  03/26/2016 DATE OF DISCHARGE:                              OPERATIVE REPORT   OPERATION: 1. Sternal wound irrigation and debridement (excisional). 2. Closure of skin and subcu tissue over sternal wound.  SURGEON:  Ivin Poot, M.D.  PREOPERATIVE DIAGNOSIS:  Open sternal wound after redo coronary artery bypass grafting with right ventricular dysfunction.  POSTOPERATIVE DIAGNOSIS:  Open sternal wound after redo coronary artery bypass grafting with right ventricular dysfunction.  ANESTHESIA:  General.  OPERATIVE PROCEDURE:  The patient was brought directly from the ICU on the ventilator and balloon pump to the operating room.  The procedure of sternal irrigation and skin closure was discussed with the patient's sister, Jerrye Noble, including the benefits, alternatives, and risks. Informed consent was obtained.  The patient was transferred to the operating room table and remained stable.  The previously placed wound VAC over the Esmarch sternal dressing was removed.  Chest tubes were left in place.  The chest was prepped and draped as a sterile field.  A proper time-out was performed.  The Esmarch sheet was removed.  The wound was inspected.  There was minimal amount of clotted blood and fibrinous material.  This was all irrigated and removed.  There was no active bleeding.  The 2 vein grafts were patent with good flow.  The graft to reconstruct the ascending aorta was intact.  The chest tubes were irrigated and cleared of any clot.  The skin on each side of the sternum was then undermined.  A wound culture had been obtained.  When the skin was mobilized, the subcutaneous tissue was closed with interrupted #1 Vicryl.  The skin was closed with a  running 2-0 nylon and sterile dressing was applied.  The patient remained hemodynamically stable.  The patient was transferred back to the ICU on the ventilator and balloon pump.  Total blood loss was minimal.     Ivin Poot, M.D.     PV/MEDQ  D:  03/20/2016  T:  03/16/2016  Job:  [id number]

## 2016-04-02 NOTE — Anesthesia Preprocedure Evaluation (Addendum)
Anesthesia Evaluation  Patient identified by MRN, date of birth, ID band Patient unresponsive    Reviewed: Allergy & Precautions, NPO status , Patient's Chart, lab work & pertinent test results  Airway Mallampati: Intubated       Dental   Pulmonary sleep apnea , COPD, former smoker,    breath sounds clear to auscultation       Cardiovascular hypertension, + CAD and + CABG   Rhythm:regular Rate:Normal  Pt on levophed, milrinone, epinephrine drips    Neuro/Psych Seizures -,  PSYCHIATRIC DISORDERS Anxiety Depression CVA    GI/Hepatic hiatal hernia, GERD  ,  Endo/Other  diabetes, Type 2Hypothyroidism   Renal/GU Renal InsufficiencyRenal disease     Musculoskeletal  (+) Arthritis ,   Abdominal   Peds  Hematology  (+) anemia ,   Anesthesia Other Findings   Reproductive/Obstetrics                            Anesthesia Physical Anesthesia Plan  ASA: IV  Anesthesia Plan: General   Post-op Pain Management:    Induction: Intravenous  Airway Management Planned: Oral ETT  Additional Equipment: Arterial line, CVP and PA Cath  Intra-op Plan:   Post-operative Plan: Post-operative intubation/ventilation  Informed Consent: I have reviewed the patients History and Physical, chart, labs and discussed the procedure including the risks, benefits and alternatives for the proposed anesthesia with the patient or authorized representative who has indicated his/her understanding and acceptance.     Plan Discussed with: CRNA, Anesthesiologist and Surgeon  Anesthesia Plan Comments:         Anesthesia Quick Evaluation

## 2016-04-02 NOTE — Consult Note (Signed)
PULMONARY / CRITICAL CARE MEDICINE   Name: Douglas Mccoy MRN: [id number] DOB: August 31, 1945    ADMISSION DATE:  03/20/2016 CONSULTATION DATE:  04/07/2016  REFERRING MD:  Dr. Prescott Gum   CHIEF COMPLAINT:  Acute Hypoxemic Respiratory Failure   HISTORY OF PRESENT ILLNESS:   71 y/o M admitted on 3/8 with recurrent syncopal episodes.  He has an extensive medical history to include myeloma s/p bone marrow transplantation on maintenance chemotherapy at the [facility], HTN, Anemia, CKD, chronic thrombocytopenia.  He was evaluated by Neurology and Cardiology and felt to have ischemic cardiomyopathy as etiology of his syncopal episodes.  CVTS was consulted and the patient underwent re-do CABG on 6/14 which was complicated by sternal adhesions with significant bleeding and cardiac arrest.  He was returned to ICU with an open chest / VAC in place and on multiple vasopressors.  Course further complicated by suspected seizures vs myoclonus.  He developed increased O2 needs and PCCM consulted for evaluation 3/19.    SUBJECTIVE:  Poor neuro response  VITAL SIGNS: BP 94/65   Pulse 90   Temp 98.6 F (37 C)   Resp (!) 26   Ht 5' 5"  (1.651 m)   Wt 97.7 kg (215 lb 6.2 oz)   SpO2 96%   BMI 35.84 kg/m   HEMODYNAMICS: PAP: (39-65)/(24-40) 49/31 CVP:  [18 mmHg-37 mmHg] 24 mmHg CO:  [2.8 L/min-3.5 L/min] 3.2 L/min CI:  [1.6 L/min/m2-2 L/min/m2] 1.9 L/min/m2  VENTILATOR SETTINGS: Vent Mode: PRVC FiO2 (%):  [75 %-100 %] 75 % Set Rate:  [18 bmp-26 bmp] 26 bmp Vt Set:  [500 mL] 500 mL PEEP:  [10 cmH20-14 cmH20] 14 cmH20 Plateau Pressure:  [39 cmH20-43 cmH20] 39 cmH20  INTAKE / OUTPUT: I/O last 3 completed shifts: In: 7012.7 [I.V.:6191.2; Blood:230; Other:30; NG/GT:236.5; IV Piggyback:325] Out: 43 [Urine:2610; Emesis/NG output:700; Drains:50; Chest Tube:720]  PHYSICAL EXAMINATION: General:  Critically ill appearing male on vent, unresposnove Neuro:  Per sluggish, no corneal's, no dolls eyes, no  movement oain stimuli HEENT:  ETT, R IJ line c/d/i Cardiovascular:  s1s2 distant tones, IABP mechanical noise, mediastinal chest tubes, open chest with VAC in place- clean Lungs:  Coarse  Abdomen:  Obese/soft, low BS Musculoskeletal:  No acute deformities  Skin:  Warm/dry, generalized edema, scrotal bruising/edema  LABS:  BMET  Recent Labs Lab 03/31/16 0500  04/08/2016 0318 03/27/2016 1551 04/09/2016 0413  NA 135  < > 133* 131* 129*  K 3.8  < > 3.9 3.7 3.7  CL 99*  < > 97* 92* 92*  CO2 22  --  23  --  21*  BUN 29*  < > 29* 30* 35*  CREATININE 2.80*  < > 2.97* 3.40* 3.22*  GLUCOSE 218*  < > 116* 129* 141*  < > = values in this interval not displayed.  Electrolytes  Recent Labs Lab 03/31/16 0500 04/11/2016 0318 04/08/2016 0413  CALCIUM 6.4* 6.4* 7.2*  MG 1.1* 1.4* 1.7    CBC  Recent Labs Lab 03/31/16 0500  03/27/2016 0318 04/11/2016 1551 03/17/2016 0413  WBC 13.6*  --  8.5  --  6.8  HGB 11.3*  < > 10.2* 10.2* 9.7*  HCT 33.6*  < > 30.4* 30.0* 28.1*  PLT 63*  --  57*  --  83*  < > = values in this interval not displayed.  Coag's  Recent Labs Lab 03/31/2016 1800 04/11/2016 2008 03/31/2016 0408  APTT 116* 93* 40*  INR 4.44* 1.60 1.23    Sepsis Markers No  results for input(s): LATICACIDVEN, PROCALCITON, O2SATVEN in the last 168 hours.  ABG  Recent Labs Lab 03/14/2016 0944 04/04/2016 1206 04/13/2016 0127  PHART 7.255* 7.291* 7.357  PCO2ART 52.9* 49.2* 37.8  PO2ART 65.0* 64.0* 68.0*    Liver Enzymes  Recent Labs Lab 03/31/16 0500 04/10/2016 0318 03/31/2016 0413  AST 129* 72* 65*  ALT 72* 48 40  ALKPHOS 60 75 86  BILITOT 1.0 1.1 1.0  ALBUMIN 2.3* 2.4* 2.2*    Cardiac Enzymes No results for input(s): TROPONINI, PROBNP in the last 168 hours.  Glucose  Recent Labs Lab 03/24/2016 0820 03/31/2016 1140 03/22/2016 1547 03/23/2016 2045 04/13/2016 0012 03/31/2016 0406  GLUCAP 120* 120* 124* 139* 159* 147*    Imaging Dg Abd Portable 1v  Result Date: 04/10/2016 CLINICAL  DATA:  Feeding tube placement. EXAM: PORTABLE ABDOMEN - 1 VIEW COMPARISON:  Chest x-ray 04/04/2016.  CT 03/26/2016 . FINDINGS: Feeding tube noted with tip projected over the distal stomach/proximal duodenum. No bowel distention. Aortoiliac atherosclerotic vascular calcification. IMPRESSION: Feeding tube noted with its tip projected over the stomach/proximal duodenum. No bowel distention. Electronically Signed   By: Marcello Moores  Register   On: 03/16/2016 13:36     STUDIES:    CULTURES: Sputum 3/19 >>  ANTIBIOTICS: Acyclovir 3/8 >>  Tressie Ellis 3/18 x1 Cefuroxime 3/19 >>  SIGNIFICANT EVENTS: 3/15  Admit for CABG x2, intraop bleeding due to aortic adhesions, code, acls  LINES/TUBES: ETT 3/15 >>  IABP 3/15 >>  R Rad Aline 3/15 >>  R IJ Swan 3/15 >>   DISCUSSION: 71 y/o M admitted for re-do CABG x2, complicated by aortic adhesions with intraoperative bleeding returned to ICU on vent with open chest / VAC, IABP and hemodynamic instability.   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 - postoperative Hx of Syncope thought related to Ischemic Cardiomyopathy Hx of HTN, HLD shock P:  Vasoactive gtt's per CVTS Post-operative care per CVTS Continue lasix gtt, may need increase Monitor I/O's closely  Wound VAC in place, will be returning to OR today per cvts  RENAL A:   Acute on Chronic Renal Failure  ATN P:   Lasix per cvts, consider  increase Chem in am   GASTROINTESTINAL A:   At Risk Malnutrition  P:   NPO  SUP: PPI  TF on hold Colace  HEMATOLOGIC A:   Acute Blood Loss Anemia  Thrombocytopenia consumption Hx Multiple Myeloma s/p Bone Marrow Transplant on Maintenance Chemotherapy P:  Trend CBC for plat No Tx needed  INFECTIOUS A:   Open Chest Wound  P:   For chest closure today  ENDOCRINE A:   DM II    Hypothyroidism  P:   SSI  Continue synthroid  NEUROLOGIC A:   Seizure Disorder - acute seizures vs myoclonus  Peripheral Neuropathy Anoxic injury - prognosis appears poor for functional recovery, may advance to brain death P:   RASS goal:  0, dc prop Seizure precautions  Continuous EEG reviewed with neuro Continue keppra, vimpat, valproate, neurontin Neurology following I don't see role CT head unless for prognosis, and would avoid transport with epi dosing now IF loses pupil reflex, would not be able to apnea, would need eeg / TCD assessment for brain death dx if warrented after family updates  FAMILY  - Updates: No family at bedside 3/20    - Inter-disciplinary family meet or Palliative Care meeting due by: 3/26  ccm time 35 min   Lavon Paganini. Titus Mould, MD, [place] Pgr: [facility]

## 2016-04-02 NOTE — Progress Notes (Signed)
Hand-off given to [place] without incident.

## 2016-04-02 NOTE — Progress Notes (Signed)
vLTM EEG complete. No skin breakdown 

## 2016-04-02 NOTE — OR Nursing (Signed)
0945 4x4 gauze and medipore tape placed over stapled surgical site on right shoulder area.

## 2016-04-03 ENCOUNTER — Encounter (HOSPITAL_COMMUNITY): Payer: Self-pay | Admitting: Cardiothoracic Surgery

## 2016-04-03 ENCOUNTER — Inpatient Hospital Stay (HOSPITAL_COMMUNITY): Payer: Medicare Other

## 2016-04-03 LAB — BPAM RBC
BLOOD PRODUCT EXPIRATION DATE: 201804062359
Blood Product Expiration Date: 201804062359
Blood Product Expiration Date: 201804102359
Blood Product Expiration Date: 201804122359
ISSUE DATE / TIME: 201803180012
ISSUE DATE / TIME: 201803180227
ISSUE DATE / TIME: 201803201812
ISSUE DATE / TIME: 201803210511
UNIT TYPE AND RH: 6200
UNIT TYPE AND RH: 6200
UNIT TYPE AND RH: 6200
Unit Type and Rh: 6200

## 2016-04-03 LAB — TYPE AND SCREEN
ABO/RH(D): A POS
ANTIBODY SCREEN: NEGATIVE
UNIT DIVISION: 0
UNIT DIVISION: 0
Unit division: 0
Unit division: 0

## 2016-04-03 LAB — POCT I-STAT 3, ART BLOOD GAS (G3+)
ACID-BASE EXCESS: 2 mmol/L (ref 0.0–2.0)
BICARBONATE: 25.7 mmol/L (ref 20.0–28.0)
O2 Saturation: 96 %
PH ART: 7.445 (ref 7.350–7.450)
TCO2: 27 mmol/L (ref 0–100)
pCO2 arterial: 37.6 mmHg (ref 32.0–48.0)
pO2, Arterial: 80 mmHg — ABNORMAL LOW (ref 83.0–108.0)

## 2016-04-03 LAB — TRIGLYCERIDES: Triglycerides: 60 mg/dL (ref <150)

## 2016-04-03 LAB — COMPREHENSIVE METABOLIC PANEL
ALT: 39 U/L (ref 17–63)
AST: 79 U/L — ABNORMAL HIGH (ref 15–41)
Albumin: 1.9 g/dL — ABNORMAL LOW (ref 3.5–5.0)
Alkaline Phosphatase: 92 U/L (ref 38–126)
Anion gap: 16 — ABNORMAL HIGH (ref 5–15)
BUN: 46 mg/dL — ABNORMAL HIGH (ref 6–20)
CO2: 22 mmol/L (ref 22–32)
Calcium: 6.3 mg/dL — CL (ref 8.9–10.3)
Chloride: 91 mmol/L — ABNORMAL LOW (ref 101–111)
Creatinine, Ser: 3.41 mg/dL — ABNORMAL HIGH (ref 0.61–1.24)
GFR calc Af Amer: 19 mL/min — ABNORMAL LOW (ref >60)
GFR calc non Af Amer: 17 mL/min — ABNORMAL LOW (ref >60)
Glucose, Bld: 104 mg/dL — ABNORMAL HIGH (ref 65–99)
Potassium: 3.6 mmol/L (ref 3.5–5.1)
Sodium: 129 mmol/L — ABNORMAL LOW (ref 135–145)
Total Bilirubin: 1.1 mg/dL (ref 0.3–1.2)
Total Protein: 4.3 g/dL — ABNORMAL LOW (ref 6.5–8.1)

## 2016-04-03 LAB — CBC WITH DIFFERENTIAL/PLATELET
Basophils Absolute: 0 10*3/uL (ref 0.0–0.1)
Basophils Relative: 0 %
Eosinophils Absolute: 0 10*3/uL (ref 0.0–0.7)
Eosinophils Relative: 0 %
HCT: 27.9 % — ABNORMAL LOW (ref 39.0–52.0)
Hemoglobin: 9.6 g/dL — ABNORMAL LOW (ref 13.0–17.0)
Lymphocytes Relative: 4 %
Lymphs Abs: 0.2 10*3/uL — ABNORMAL LOW (ref 0.7–4.0)
MCH: 27 pg (ref 26.0–34.0)
MCHC: 34.4 g/dL (ref 30.0–36.0)
MCV: 78.6 fL (ref 78.0–100.0)
Monocytes Absolute: 0.4 10*3/uL (ref 0.1–1.0)
Monocytes Relative: 8 %
Neutro Abs: 4.4 10*3/uL (ref 1.7–7.7)
Neutrophils Relative %: 87 %
Platelets: 70 10*3/uL — ABNORMAL LOW (ref 150–400)
RBC: 3.55 MIL/uL — ABNORMAL LOW (ref 4.22–5.81)
RDW: 15.4 % (ref 11.5–15.5)
WBC: 5.1 10*3/uL (ref 4.0–10.5)

## 2016-04-03 LAB — CULTURE, RESPIRATORY W GRAM STAIN: Special Requests: NORMAL

## 2016-04-03 LAB — GLUCOSE, CAPILLARY
GLUCOSE-CAPILLARY: 111 mg/dL — AB (ref 65–99)
Glucose-Capillary: 100 mg/dL — ABNORMAL HIGH (ref 65–99)
Glucose-Capillary: 121 mg/dL — ABNORMAL HIGH (ref 65–99)
Glucose-Capillary: 127 mg/dL — ABNORMAL HIGH (ref 65–99)

## 2016-04-03 LAB — COOXEMETRY PANEL
Carboxyhemoglobin: 0.9 % (ref 0.5–1.5)
Methemoglobin: 1.3 % (ref 0.0–1.5)
O2 Saturation: 77 %
Total hemoglobin: 10.3 g/dL — ABNORMAL LOW (ref 12.0–16.0)

## 2016-04-03 LAB — MAGNESIUM: Magnesium: 1.7 mg/dL (ref 1.7–2.4)

## 2016-04-03 MED ORDER — SODIUM CHLORIDE 0.9 % IV SOLN
10.0000 mg/h | INTRAVENOUS | Status: DC
  Administered 2016-04-03: 10 mg/h via INTRAVENOUS
  Filled 2016-04-03: qty 10

## 2016-04-03 MED ORDER — MIDAZOLAM BOLUS VIA INFUSION (WITHDRAWAL LIFE SUSTAINING TX)
5.0000 mg | INTRAVENOUS | Status: DC | PRN
  Administered 2016-04-03: 5 mg via INTRAVENOUS
  Administered 2016-04-03: 15 mg via INTRAVENOUS
  Administered 2016-04-03: 20 mg via INTRAVENOUS
  Administered 2016-04-03: 5 mg via INTRAVENOUS
  Filled 2016-04-03: qty 20

## 2016-04-03 MED ORDER — MORPHINE BOLUS VIA INFUSION
5.0000 mg | INTRAVENOUS | Status: DC | PRN
  Administered 2016-04-03: 8 mg via INTRAVENOUS
  Administered 2016-04-03 (×2): 5 mg via INTRAVENOUS
  Administered 2016-04-03: 15 mg via INTRAVENOUS
  Administered 2016-04-03 (×2): 20 mg via INTRAVENOUS
  Administered 2016-04-03: 10 mg via INTRAVENOUS
  Filled 2016-04-03: qty 20

## 2016-04-03 MED ORDER — SODIUM CHLORIDE 0.9 % IV SOLN
10.0000 mg/h | INTRAVENOUS | Status: DC
  Administered 2016-04-03: 12 mg/h via INTRAVENOUS
  Filled 2016-04-03: qty 20

## 2016-04-03 MED ORDER — SODIUM CHLORIDE 0.9 % IV SOLN
1.0000 g | Freq: Once | INTRAVENOUS | Status: AC
  Administered 2016-04-03: 1 g via INTRAVENOUS
  Filled 2016-04-03: qty 10

## 2016-04-03 MED ORDER — MORPHINE SULFATE (PF) 4 MG/ML IV SOLN: 2.0000 mg | INTRAVENOUS | Status: DC | PRN

## 2016-04-03 MED ORDER — SODIUM CHLORIDE 0.9 % IV SOLN
10.0000 mg/h | INTRAVENOUS | Status: DC
  Administered 2016-04-03: 10 mg/h via INTRAVENOUS
  Filled 2016-04-03: qty 5
  Filled 2016-04-03: qty 10

## 2016-04-05 LAB — AEROBIC CULTURE W GRAM STAIN (SUPERFICIAL SPECIMEN): Culture: NO GROWTH

## 2016-04-05 LAB — AEROBIC CULTURE  (SUPERFICIAL SPECIMEN)

## 2016-04-06 LAB — TYPE AND SCREEN
ABO/RH(D): A POS
Antibody Screen: NEGATIVE
Unit division: 0

## 2016-04-06 LAB — BPAM RBC
Blood Product Expiration Date: 201804122359
Unit Type and Rh: 6200

## 2016-04-06 LAB — CULTURE, BLOOD (SINGLE): Culture: NO GROWTH

## 2016-04-14 NOTE — Discharge Summary (Signed)
Douglas Mccoy, Douglas Mccoy NO.:  [id number]  MEDICAL RECORD NO.:  [id number]  LOCATION:                                FACILITY:  [facility]  PHYSICIAN:  Ivin Poot, M.D.  DATE OF BIRTH:  1945/04/29  DATE OF ADMISSION:  03/24/2016 DATE OF DISCHARGE:  04-13-16                              DISCHARGE SUMMARY   DISCHARGE-DEATH SUMMARY  DATE OF DEATH:  Apr 13, 2016.  ADMISSION DIAGNOSES: 1. Recurrent syncope and hypotension-cardiogenic etiology. 2. History of coronary artery disease, status post coronary artery     bypass graft x4 at [facility]. 3. Severe recurrent coronary artery disease with occlusion of all vein     grafts, patent left IMA graft with preserved LV systolic function. 4. Type 2 diabetes. 5. Chronic obstructive pulmonary disease, history of remote smoking. 6. History of cerebrovascular accident with preoperative seizure     disorder, on Keppra. 7. History of multiple myeloma, status post chemotherapy and bone     marrow transplant at the [facility] in 2015, with maintenance     chemotherapy for persistent myeloma. 8. Chronic thrombocytopenia.  Baseline platelet count 60,000 due to     chronic chemotherapy. 9. History of transient acute renal failure.  OPERATIONS AND PROCEDURES: 1. Cardiac catheterization March 25, 2016, by Dr. Nona Dell     demonstrating severe recurrent coronary artery disease with     occlusion of all vein grafts, patent left IMA to a small LAD and     graftable vessels in the circumflex and RCA distribution. 2. Cardiothoracic surgical consultation March 26, 2016, by Dr. Prescott Gum. 3. Echocardiogram, March 26, 2016. 4. Redo coronary artery bypass grafting x2 with endoscopic harvest of     left leg greater saphenous vein, replacement of ascending aorta     with 28 mm Hemashield straight graft under hypothermic circulatory     arrest with antegrade cerebral perfusion and right axillary artery     cannulation for  cardiopulmonary bypass. 5. Wound complex placed via right femoral artery. 6. Return to OR for sternal wound irrigation. March 29, 2016. 7. Neurology consultation, March 30, 2016. 8. Sternal wound closure, April 02, 2016. 9. The patient was made DNR, comfort care initiated on 04-13-2016.  HOSPITAL COURSE:  The patient is a 71 year old Caucasian male who was admitted to the emergency department for recurrent episodes of syncope and hypotension felt to be due to cardiogenic etiology.  The patient had presented to the emergency department with similar problems on three separate occasions within the past 2 weeks and had previously presented to the hospital with similar complaints at least twice on other occasions in the past 2 months.  The patient was admitted and Cardiology consultation was obtained.  A stress test showed an ischemic response with severe inferolateral ischemia with stress.  The patient underwent cardiac catheterization by Dr. Claiborne Billings, which showed severe recurrent CAD. Native RCA was 100% occluded, native LAD was 100% occluded, and native circumflex was 90% stenotic.  The left IMA to the LAD-diagonal was open, but was small target vessels.  The patient's ejection fraction was 55%. By echocardiogram, there was only mild LV  dysfunction with mild mitral regurgitation and aortic insufficiency.  The patient had possible targets for redo CBG, was ambulatory and living independently in a [place] sponsored homeless shelter.  A cardiothoracic surgical evaluation was requested.  I reviewed the patient's echocardiogram, cardiac catheterization, brain CT scan, and PFTs. Carotid duplex scans have been performed, which showed no significant carotid artery stenosis.  Because the patient had severe recurrent symptoms of syncope with documented severe coronary artery disease with adequate remaining saphenous vein conduit by vein mapping and an adequate functional status, I felt he was a  potential candidate for redo CABG, but at high risk due to his comorbid medical problems.  These included chronic thrombocytopenia from his maintenance chemotherapy for multiple myeloma.  The patient had been followed by the [facility] hematologist for his multiple myeloma and was reportedly stable with maintenance chemotherapy.  The patient was given platelet transfusion prior to surgery and then prepared for high risk redo CABG.  I reviewed the patient's preoperative chest x-ray and ordered a CT scan to determine the proximity of the cardiac structures to the sternum for required redo sternotomy.  It appeared there was adequate space between the right ventricle and the posterior table of the sternum.  The ascending aorta, which was minimally enlarged, appeared to be close to the posterior table of the sternum.  The patient was brought to the operating room on March 15. Transesophageal echo probe was placed, which documented preoperative diagnosis of preserved LV function with mild MR and mild AI.  Prior to sternotomy, the patient had an arterial graft-cannula anastomosed to the subclavian artery for access to cardiopulmonary bypass.  A 5-French micro introducer was placed into the right femoral vein and a guidewire placed into the superior vena cava under echo guidance for the need for emergency cardiopulmonary bypass.  The previous sternal incision was opened and the sternal wires were removed.  The oscillating saw was then used to carefully perform redo sternotomy first dividing the outer table of the sternum and then the inner table of the sternum.  During the final stage of dividing the inner table of the sternum, in the mid upper portion of the sternal incision, there was severe bleeding.  Pressure was applied to the bleeding site, the patient was heparinized, and he was placed on cardiopulmonary bypass through the axillary artery arterial inflow cannula and a long  23-25-French 2-stage venous cannula from the right femoral vein.  While on bypass, flows were maintained, but the patient remained hypotensive as flow into the arch from the arterial cannula exited the site of bleeding in the ascending aorta  Further rapid dissection to expose the ascending aorta to control the bleeding with pledgeted sutures and to expose the right atrium for better venous drainage  was performed.  A second venous cannula was placed in the right atrium and connected to the heart-lung machine.  Better exposure of the aorta allowed direct repair of theascending aorta with several pledgeted Prolene sutures.  After blood pressure was normalized, further dissection of the anterior mediastinum was carried out to identify the previously placed vein grafts, the patent left IMA, which was identified and preserved, and the anterior aspect of the right ventricle and the anterolateral aspect of the left ventricle.  The patient was then cooled to prepare for hypothermic circulatory arrest as I felt it was not safe to clamp the aorta due to its fragile and tenuous nature and risk for bleeding.  We then proceeded with redo bypass graft  to the distal RCA and circumflex with good saphenous vein, which was harvested from the left leg.  The patient also had the ascending aorta clearly replaced from the sinotubular junction to the proximal arch with a 28-mm Hemashield graft.  The patient was rewarmed and reperfused.  Attempt at weaning off bypass was unsuccessful, so the patient had a balloon pump placed and along with balloon pump augmentation and moderate doses of inotropes, the patient was separated from cardiopulmonary bypass.  The patient had a significant coagulopathy and required multiple units of blood product transfusion.  The right ventricle was edematous and chest closure was not possible, so an Esmarch sterile dressing was placed over the mediastinum with a wound VAC on top of the  Esmarch.  The patient then returned to the ICU.  The first 12 hours, the patient's coagulopathy was successfully treated with blood products and factors.  The patient's hemodynamics improved overnight and urine output remained adequate.  The patient's oxygenation was adequate, but marginal.  Increased levels of PEEP and FiO2 were required.  The day after surgery, the patient had normal sized pupils, which were reactive.  There was no other response to stimulation, although the patient was heavily sedated with open chest.  The patient remained hemodynamically stable on balloon pump and moderate levels of inotropes with adequate urine output and borderline, but adequate pulmonary function.  Because of an evidence of hematoma and the open chest, the patient was returned to the operating room for sternal washout on March 16.  The sternum was left open because of the edema of the heart.  The vein grafts were noted and were patent.  The Hemashield graft was intact and hemostatic.  On the next day, the patient developed facial seizure activity and Neurology was consulted.  The patient was given high dose of antiseizure medication after EEG showed evidence of status epilepticus.  Over the next 2 days, the seizures responded to medical therapy.  The patient otherwise, however, remain unresponsive. Pulmonary, renal function, and hemodynamics showed some improvement, although the patient was still dependent on the ventilator, inotropes and balloon pump, and a Lasix drip.  On April 02, 2016, the patient was turned to the operating room for sternal washout again.  The heart size was improved and generalized edema was improved.  The skin and fascia were closed over the sternal wound.  Cardiac function appeared to be stable.  The patient was then returned back to the ICU.  Sedation and all pain medications were stopped for 24 hours.  The patient still had no response to stimulus.  The patient had  reflexes; however, with no response to stimulus and multisystem organ failure, discussion with the patient's sister and family resulted in a DNR policy.  This was then progressed to comfort care and withdrawal of support.  The patient was terminally extubated on 04-30-16, in the presence of his family and with their consent and understanding.  He expired the afternoon of 30-Apr-2016.  FINAL DIAGNOSES: 1. Severe recurrent coronary artery disease with preserved left     ventricular function, but recurrent symptomatic syncope and     hypertension. 2. History of seizure disorder and previous stroke. 3. History of multiple myeloma and chronic thrombocytopenia. 4. Diabetes mellitus. 5. Postoperative multiorgan failure and status epilepticus. 6. 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     Ivin Poot, M.D.     PV/MEDQ  D:  04/07/2016  T:  04/07/2016  Job:  [id number]

## 2016-04-14 NOTE — Progress Notes (Signed)
1 Day Post-Op Procedure(s) (LRB): STERNAL WOUND DEBRIDEMENT (N/A) REMOVAL OF WOUND VAC (N/A) WOUND CLOSURE (N/A) Subjective: No neurologic response to stimuli > 24 hrs off pain meds, sedation- pupils still normal Some improvement in pulmonary, renal function Discussed situation woth patients sister Douglas Mccoy and the patient has been made DNR The patients family has not yet seen the patient postop and wishes to do so- will continue current level of support but not withdraw until family decide they are ready  Objective: Vital signs in last 24 hours: Temp:  [95.9 F (35.5 C)-99.9 F (37.7 C)] 97.7 F (36.5 C) (03/21 0930) Pulse Rate:  [80-89] 87 (03/21 0729) Cardiac Rhythm: Atrial fibrillation (03/21 0800) Resp:  [0-33] 0 (03/21 0930) BP: (89-146)/(47-91) 120/73 (03/21 0900) SpO2:  [89 %-100 %] 95 % (03/21 0930) Arterial Line BP: (106-170)/(33-64) 157/45 (03/21 0930) FiO2 (%):  [60 %-100 %] 60 % (03/21 0729) Weight:  [212 lb 4.9 oz (96.3 kg)] 212 lb 4.9 oz (96.3 kg) (03/21 0500)  Hemodynamic parameters for last 24 hours: PAP: (33-49)/(14-31) 43/14 CVP:  [16 mmHg-54 mmHg] 17 mmHg CO:  [2.9 L/min-3.6 L/min] 3.2 L/min CI:  [1.7 L/min/m2-2.1 L/min/m2] 1.9 L/min/m2  Intake/Output from previous day: 03/20 0701 - 03/21 0700 In: 4088.2 [I.V.:3353.2; Blood:335; NG/GT:180; IV Piggyback:220] Out: 2556 [Urine:1285; Emesis/NG output:500; Stool:1; Blood:50; Chest Tube:720] Intake/Output this shift: Total I/O In: 141 [I.V.:141] Out: 285 [Urine:275; Chest Tube:10]       Exam    General-  Peaceful non responsive on vent   Lungs- clear with scattered rales, wheezes   Cor- afib, no murmur   Abdomen- soft, non-tender   Extremities - warm, non-tender, severel edema   Neuro- non responsive   Lab Results:  Recent Labs  04/12/2016 0413  03/23/2016 1625 04-16-2016 0400  WBC 6.8  --   --  5.1  HGB 9.7*  < > 8.5* 9.6*  HCT 28.1*  < > 25.0* 27.9*  PLT 83*  --   --  70*  < > = values in this  interval not displayed. BMET:  Recent Labs  04/05/2016 0413  03/19/2016 1625 Apr 16, 2016 0400  NA 129*  < > 131* 129*  K 3.7  < > 3.8 3.6  CL 92*  --  92* 91*  CO2 21*  --   --  22  GLUCOSE 141*  --  104* 104*  BUN 35*  --  45* 46*  CREATININE 3.22*  --  3.50* 3.41*  CALCIUM 7.2*  --   --  6.3*  < > = values in this interval not displayed.  PT/INR: No results for input(s): LABPROT, INR in the last 72 hours. ABG    Component Value Date/Time   PHART 7.445 04-16-2016 0434   HCO3 25.7 04-16-2016 0434   TCO2 27 Apr 16, 2016 0434   ACIDBASEDEF 2.0 04/05/2016 1044   O2SAT 96.0 2016-04-16 0434   CBG (last 3)   Recent Labs  16-Apr-2016 0026 2016/04/16 0413 2016-04-16 0807  GLUCAP 100* 127* 111*    Assessment/Plan: S/P Procedure(s) (LRB): STERNAL WOUND DEBRIDEMENT (N/A) REMOVAL OF WOUND VAC (N/A) WOUND CLOSURE (N/A) Diuresis DNR, do not escalate care- discussed with patients sister   LOS: 12 days    Tharon Aquas Trigt III Apr 16, 2016

## 2016-04-14 NOTE — Progress Notes (Signed)
I have had extensive discussions with family sisters, brother in law and nephew. We discussed patients current circumstances and organ failures. We also discussed patient's prior wishes under circumstances such as this. Family has decided to NOT perform resuscitation if arrest and they insist now for comfort care based on his prior wishes and poor quality outcome. Sons x 2 in [place] wil NOT want to visit cvts in agreement per note and RN  Lavon Paganini. Titus Mould, MD, [place] Pgr: [facility]

## 2016-04-14 NOTE — Progress Notes (Signed)
[facility] Progress Note Patient Name: Douglas Mccoy DOB: [birth year] MRN: [id number]   Date of Service  04-23-16  HPI/Events of Note  Ca++ = 6.3 and Albumin = 1.9. Ca++ corrects to 7.98 which is still low.   eICU Interventions  Will replace Ca++.     Intervention Category Major Interventions: Electrolyte abnormality - evaluation and management  Sommer,Steven Eugene 2016-04-23, 5:32 AM

## 2016-04-14 NOTE — Progress Notes (Signed)
Nutrition Follow Up/Brief Note  Chart reviewed. Pt now transitioning to comfort care.  No further nutrition interventions warranted at this time.  Please re-consult as needed.   Katie Keysha Damewood, RD, LDN Pager #: [phone] After-Hours Pager #: [phone]    

## 2016-04-14 NOTE — Progress Notes (Signed)
   2016/04/27 1115  Clinical Encounter Type  Visited With Patient and family together  Visit Type Initial  Referral From Nurse  Consult/Referral To Chaplain  Spiritual Encounters  Spiritual Needs Prayer;Emotional;Grief support  Stress Factors  Family Stress Factors Major life changes;Other (Comment) (end of life)  Pt is a 71 yr old white male, Veteran, end of life.  Chaplain offered prayer, emotional support, active listening, ministry of presence. Family also heard a prayer from family pastor via phone during visit. Advised to contact [facility] if further need assistance.   Chaplain Alan Drummer A. Hilja Kintzel [phone]

## 2016-04-14 NOTE — Progress Notes (Signed)
Neurology Progress Note  Subjective: There have been no significant overnight events. He remains comatose off of sedation. He continues to require vasopressor support. No further seizure activity has been observed. He has not shown any evidence of neurologic improvement. He is now DNR after Dr. Lucianne Lei Trigt's discussion with his sister. Per RN, the patient's sister and son want to come visit him, after which he may be transitioned to comfort measures.   The patient is comatose and unable to participate in ROS.   Medications reviewed and reconciled.   Pertinent meds: Versed infusion stopped at 0900 on 04/13/2016 Propofol infusion stopped at 0810 on 03/27/2016 Amiodarone infusion 30 mg/hr Epinephrine infusion 15 mcg/minute Furosemide infusion 15 mg/h Milrinone infusion 0.375 mcg/kg/m Norepinephrine infusion 4 mcg/minute Depacon 500 mg IV every 8 hours Keppra 1500 mg IV every 24 hours Gabapentin 600 mg twice daily  Current Meds:   Current Facility-Administered Medications:  .  0.45 % sodium chloride infusion, , Intravenous, Continuous PRN, Erin R Barrett, PA-C, Last Rate: 10 mL/hr at 04/13/2016 1900 .  0.9 %  sodium chloride infusion, 250 mL, Intravenous, Continuous, Erin R Barrett, PA-C, Last Rate: 1 mL/hr at 03/31/2016 0522, 250 mL at 03/27/2016 0522 .  0.9 %  sodium chloride infusion, , Intravenous, Continuous, Erin R Barrett, PA-C, Last Rate: 20 mL/hr at 04/09/2016 1900 .  acyclovir (ZOVIRAX) 200 MG capsule 200 mg, 200 mg, Oral, BID, Lezlie Octave Black, NP, 200 mg at 04/10/2016 0909 .  albuterol (PROVENTIL) (2.5 MG/3ML) 0.083% nebulizer solution 2.5 mg, 2.5 mg, Inhalation, Q6H PRN, Jonetta Osgood, MD .  Margrett Rud amiodarone (NEXTERONE) 1.8 mg/mL load via infusion 150 mg, 150 mg, Intravenous, Once, 150 mg at 03/19/2016 1801 **FOLLOWED BY** [EXPIRED] amiodarone (NEXTERONE PREMIX) 360-4.14 MG/200ML-% (1.8 mg/mL) IV infusion, 60 mg/hr, Intravenous, Continuous, Last Rate: 33.3 mL/hr at 03/27/2016 2200, 60 mg/hr at  03/31/2016 2200 **FOLLOWED BY** amiodarone (NEXTERONE PREMIX) 360-4.14 MG/200ML-% (1.8 mg/mL) IV infusion, 30 mg/hr, Intravenous, Continuous, Ivin Poot, MD, Last Rate: 16.7 mL/hr at 04-10-16 0800, 30 mg/hr at 04-10-2016 0800 .  aspirin EC tablet 325 mg, 325 mg, Oral, Daily **OR** aspirin chewable tablet 324 mg, 324 mg, Per Tube, Daily, Erin R Barrett, PA-C, 324 mg at 04/10/2016 0909 .  atorvastatin (LIPITOR) tablet 80 mg, 80 mg, Oral, q1800, Eileen Stanford, PA-C, 80 mg at 03/23/2016 1807 .  cefTAZidime (FORTAZ) 2 g in dextrose 5 % 50 mL IVPB, 2 g, Intravenous, Q24H, Ivin Poot, MD, 2 g at 03/30/2016 1815 .  chlorhexidine gluconate (MEDLINE KIT) (PERIDEX) 0.12 % solution 15 mL, 15 mL, Mouth Rinse, BID, Ivin Poot, MD, 15 mL at 04/10/16 0800 .  EPINEPHrine (ADRENALIN) 4 mg in dextrose 5 % 250 mL (0.016 mg/mL) infusion, 0-20 mcg/min, Intravenous, Titrated, Ivin Poot, MD, Last Rate: 56.3 mL/hr at 10-Apr-2016 0833, 15 mcg/min at 04/10/2016 0833 .  feeding supplement (NEPRO CARB STEADY) liquid 1,000 mL, 1,000 mL, Oral, Q24H, Ivin Poot, MD, Last Rate: 20 mL/hr at 04/09/2016 1900, 1,000 mL at 04/11/2016 1900 .  fluticasone (FLONASE) 50 MCG/ACT nasal spray 2 spray, 2 spray, Each Nare, Daily PRN, Radene Gunning, NP .  furosemide (LASIX) 250 mg in dextrose 5 % 250 mL (1 mg/mL) infusion, 15 mg/hr, Intravenous, Continuous, Melrose Nakayama, MD, Last Rate: 15 mL/hr at 04/10/16 0800, 15 mg/hr at 04/10/2016 0800 .  insulin aspart (novoLOG) injection 0-24 Units, 0-24 Units, Subcutaneous, Q4H, Ivin Poot, MD, 2 Units at Apr 10, 2016 [phone] .  insulin detemir (  LEVEMIR) injection 15 Units, 15 Units, Subcutaneous, BID, Melrose Nakayama, MD, 15 Units at 05/01/2016 [phone] .  lactated ringers infusion, , Intravenous, Continuous, Erin R Barrett, PA-C, Stopped at 04/07/2016 0800 .  lactated ringers infusion, , Intravenous, Continuous, Erin R Barrett, PA-C, Last Rate: 10 mL/hr at 05/01/2016 0800 .  levalbuterol (XOPENEX)  nebulizer solution 1.25 mg, 1.25 mg, Nebulization, Q6H, Ivin Poot, MD, 1.25 mg at 05-01-2016 3354 .  levETIRAcetam (KEPPRA) 1,500 mg in sodium chloride 0.9 % 100 mL IVPB, 1,500 mg, Intravenous, Q24H, Sallyanne Havers, MD, 1,500 mg at May 01, 2016 0910 .  levothyroxine (SYNTHROID, LEVOTHROID) tablet 50 mcg, 50 mcg, Oral, QAC breakfast, Radene Gunning, NP, 50 mcg at 05/01/16 0909 .  LORazepam (ATIVAN) injection 1 mg, 1 mg, Intravenous, Q4H PRN, Melrose Nakayama, MD, 1 mg at 03/30/16 5625 .  MEDLINE mouth rinse, 15 mL, Mouth Rinse, QID, Ivin Poot, MD, 15 mL at 2016-05-01 0400 .  methylPREDNISolone sodium succinate (SOLU-MEDROL) 125 mg/2 mL injection 60 mg, 60 mg, Intravenous, Q12H, Ivin Poot, MD, 60 mg at May 01, 2016 0029 .  metoprolol (LOPRESSOR) injection 2.5-5 mg, 2.5-5 mg, Intravenous, Q2H PRN, Erin R Barrett, PA-C .  midazolam (VERSED) injection 2 mg, 2 mg, Intravenous, Q1H PRN, Erin R Barrett, PA-C, 2 mg at 03/30/16 1930 .  milrinone (PRIMACOR) 20 MG/100 ML (0.2 mg/mL) infusion, 0.375 mcg/kg/min (Order-Specific), Intravenous, Continuous, Ivin Poot, MD, Last Rate: 7.4 mL/hr at 2016/05/01 0800, 0.375 mcg/kg/min at 2016/05/01 0800 .  morphine 2 MG/ML injection 2-5 mg, 2-5 mg, Intravenous, Q1H PRN, Erin R Barrett, PA-C, 2 mg at May 01, 2016 0949 .  norepinephrine (LEVOPHED) 16 mg in dextrose 5 % 250 mL (0.064 mg/mL) infusion, 0-40 mcg/min, Intravenous, Titrated, Melrose Nakayama, MD, Last Rate: 3.8 mL/hr at 05/01/2016 1000, 4 mcg/min at 05-01-2016 1000 .  ondansetron (ZOFRAN) injection 4 mg, 4 mg, Intravenous, Q6H PRN, Erin R Barrett, PA-C .  oxyCODONE (Oxy IR/ROXICODONE) immediate release tablet 5-10 mg, 5-10 mg, Oral, Q3H PRN, Erin R Barrett, PA-C .  pantoprazole (PROTONIX) injection 40 mg, 40 mg, Intravenous, Q24H, Ivin Poot, MD, 40 mg at 04/10/2016 1320 .  propofol (DIPRIVAN) 1000 MG/100ML infusion, 5-80 mcg/kg/min, Intravenous, Titrated, Ivin Poot, MD, Stopped at 03/14/2016 0800 .   sertraline (ZOLOFT) tablet 50 mg, 50 mg, Oral, Daily, Lezlie Octave Black, NP, 50 mg at 2016/05/01 0910 .  sodium chloride flush (NS) 0.9 % injection 3 mL, 3 mL, Intravenous, Q12H, Erin R Barrett, PA-C, 3 mL at 03/31/16 2134 .  sodium chloride flush (NS) 0.9 % injection 3 mL, 3 mL, Intravenous, PRN, Erin R Barrett, PA-C .  valproate (DEPACON) 500 mg in dextrose 5 % 50 mL IVPB, 500 mg, Intravenous, Q8H, Greta Doom, MD, 500 mg at 2016/05/01 0600  Objective:  Temp:  [95.9 F (35.5 C)-99.9 F (37.7 C)] 97.5 F (36.4 C) (03/21 1015) Pulse Rate:  [80-89] 87 (03/21 0729) Resp:  [0-33] 0 (03/21 1015) BP: (89-146)/(52-91) 117/77 (03/21 1000) SpO2:  [89 %-100 %] 96 % (03/21 1015) Arterial Line BP: (106-170)/(33-71) 152/52 (03/21 1015) FiO2 (%):  [60 %-100 %] 60 % (03/21 0729) Weight:  [96.3 kg (212 lb 4.9 oz)] 96.3 kg (212 lb 4.9 oz) (03/21 0500)  General: WD obese elderly Caucasian man lying in ICU bed. He is intubated on multiple drips, no sedation. He is completely unresponsive to verbal, tactile, and noxious stimulation. There is no eye opening, either spontaneously or with stimulation. He does not follow any commands. He  is diffusely edematous. HEENT: Neck is supple without lymphadenopathy. ETT, OGT in place. Right IJ line in place. Sclerae are anicteric with significant edema. There is no conjunctival injection. His tongue is severely edematous and protrudes from his mouth. CV: Regular, distant, no obvious murmur. Carotid pulses are 2+ and symmetric with no bruits.  Lungs: CTAB on anterior exam. Ventilated. Extremities: No cyanosis. Distal pulses 1+. Patchy ecchymosis on the knees. Diffuse edema. Neuro: MS: As noted above.  CN: Pupils are equal and reactive from 3-->2 mm bilaterally. He does not blink to visual threat. His eyes are mildly disconjugate with no forced deviation and no nystagmus. Corneals are intact and symmetric. His face appears to be grossly symmetric but is partly obscured by  tubes and tape. No gag with endotracheal tube manipulation. The remainder of his cranial nerves cannot be accurately assessed as he is unable to participate with the examination.  Motor: Normal bulk. He is flaccid throughout. No spontaneous movements of the extremities were observed. No seizure or other abnormal motor activity was seen. Sensation: No response to nailbed pressure 4. No response to supraorbital pressure on either side. No response to central pain. DTRs: Trace throughout, limited by edema in the arms. Toes are mute bilaterally.   Coordination and gait: These cannot be assessed as the patient is unable to participate with the examination.   Labs: Lab Results  Component Value Date   WBC 5.1 04/20/2016   HGB 9.6 (L) 04-20-2016   HCT 27.9 (L) Apr 20, 2016   PLT 70 (L) Apr 20, 2016   GLUCOSE 104 (H) 04-20-16   CHOL 68 04/08/2016   TRIG 60 04/20/2016   HDL 31 (L) 04/04/2016   LDLCALC 23 03/30/2016   ALT 39 04-20-2016   AST 79 (H) Apr 20, 2016   NA 129 (L) 04-20-16   K 3.6 04-20-2016   CL 91 (L) 04/20/16   CREATININE 3.41 (H) 04-20-2016   BUN 46 (H) 04-20-2016   CO2 22 April 20, 2016   TSH 5.763 (H) 03/19/2016   INR 1.23 03/24/2016   HGBA1C 8.4 (H) 03/27/2016   CBC Latest Ref Rng & Units 04/20/16 03/26/2016 03/16/2016  WBC 4.0 - 10.5 K/uL 5.1 - -  Hemoglobin 13.0 - 17.0 g/dL 9.6(L) 8.5(L) 10.2(L)  Hematocrit 39.0 - 52.0 % 27.9(L) 25.0(L) 30.0(L)  Platelets 150 - 400 K/uL 70(L) - -    Lab Results  Component Value Date   HGBA1C 8.4 (H) 03/27/2016   Lab Results  Component Value Date   ALT 39 April 20, 2016   AST 79 (H) 04-20-2016   ALKPHOS 92 April 20, 2016   BILITOT 1.1 04-20-16    Radiology:  There is no neuro imaging for review   A/P:   1. Status epilepticus: This has resolved. Seizures in this case may have been provoked by relative cerebral hypoperfusion in the setting of bypass surgery. Additional considerations include acute ischemic stroke, intracranial  hemorrhage, and anoxic brain injury but imaging has not been possible as he has not been stable enough. Continue Keppra and Depacon.   2. Acute encephalopathy: This is most likely multifactorial in etiology with potential contributions from status epilepticus, possible acute CNS insult (infarction, hemorrhage, anoxic brain injury all possible though not clearly substantiated at this time), age, co-morbid illness, severity of medical illness, medications/polypharmacy, metabolic derangement. If he stabilizes enough, neuroimaging would be helpful at some point in order to evaluate for any possible CNS insult and would start with CT scan of the head without contrast. Continue to optimize metabolic status as you are.  Continue to treat any potential infection as needed. Minimize the use of opiates, benzos or any medication with strong anticholinergic properties as much as possible.   His neurologic prognosis appears to be poor, particularly in light of his numerous critical medical issues. Per notes and d/w RN, withdrawal of life-sustaining measures is being considered once the patient's family has had the chance to visit. I think this is appropriate given poor prognosis.   No family was present at the bedside at the time of my visit. Per notes, he has a sister who is been making decisions but he was not been to see the patient during this admission.  This patient is critically ill and at significant risk of neurological worsening, death and care requires constant monitoring of vital signs, hemodynamics,respiratory and cardiac monitoring, neurological assessment, discussion with family, other specialists and medical decision making of high complexity. A total of 30 minutes of critical care time was spent on this case.   Melba Coon, MD [facility]

## 2016-04-14 NOTE — Progress Notes (Signed)
CRITICAL VALUE ALERT  Critical value received:  Calcium 6.3  Date of notification:  April 07, 2016  Time of notification:  0510  Critical value read back: yes  Nurse who received alert:  A. Darcel Smalling RN  MD notified (1st page):  Dr. Oletta Darter [facility] RN in [place] made aware of value. Will pass on to Dr. Oletta Darter for orders.

## 2016-04-14 NOTE — Consult Note (Signed)
PULMONARY / CRITICAL CARE MEDICINE   Name: Douglas Mccoy MRN: [id number] DOB: 05/13/1945    ADMISSION DATE:  03/14/2016 CONSULTATION DATE:  03/16/2016  REFERRING MD:  Dr. Prescott Gum   CHIEF COMPLAINT:  Acute Hypoxemic Respiratory Failure   HISTORY OF PRESENT ILLNESS:   71 y/o M admitted on 3/8 with recurrent syncopal episodes.  He has an extensive medical history to include myeloma s/p bone marrow transplantation on maintenance chemotherapy at the [facility], HTN, Anemia, CKD, chronic thrombocytopenia.  He was evaluated by Neurology and Cardiology and felt to have ischemic cardiomyopathy as etiology of his syncopal episodes.  CVTS was consulted and the patient underwent re-do CABG on 0/71 which was complicated by sternal adhesions with significant bleeding and cardiac arrest.  He was returned to ICU with an open chest / VAC in place and on multiple vasopressors.  Course further complicated by suspected seizures vs myoclonus.  He developed increased O2 needs and PCCM consulted for evaluation 3/19.    SUBJECTIVE:  Skin closed Some reduction epi, levo Some myoclonis face  VITAL SIGNS: BP 105/67 (BP Location: Left Arm)   Pulse 87   Temp 98.2 F (36.8 C)   Resp (!) 0   Ht 5' 5" (1.651 m)   Wt 96.3 kg (212 lb 4.9 oz)   SpO2 94%   BMI 35.33 kg/m   HEMODYNAMICS: PAP: (33-49)/(15-31) 45/18 CVP:  [16 mmHg-54 mmHg] 17 mmHg CO:  [2.9 L/min-3.6 L/min] 3.2 L/min CI:  [1.7 L/min/m2-2.1 L/min/m2] 1.9 L/min/m2  VENTILATOR SETTINGS: Vent Mode: PRVC FiO2 (%):  [60 %-100 %] 60 % Set Rate:  [32 bmp] 32 bmp Vt Set:  [430 mL] 430 mL PEEP:  [12 cmH20-14 cmH20] 12 cmH20 Plateau Pressure:  [29 cmH20-37 cmH20] 29 cmH20  INTAKE / OUTPUT: I/O last 3 completed shifts: In: 6092.4 [I.V.:5107.4; Blood:335; Other:30; NG/GT:290; IV Piggyback:330] Out: 2197 [Urine:2060; Emesis/NG output:500; Stool:1; Blood:50; Chest Tube:800]  PHYSICAL EXAMINATION: General:  Critically ill appearing male on vent,  unresposnove Neuro:  Per sluggish, no corneal's, no dolls eyes, some mild myoclonis face HEENT:  ETT line wnl Cardiovascular:  s1s2 distant tones, IABP mechanical noise, mediastinal chest tubes Lungs:  Coarse ronchi Abdomen:  Obese/soft, low BS Musculoskeletal:  No acute deformities  Skin:  Warm/dry, generalized edema, scrotal bruising/edema  LABS:  BMET  Recent Labs Lab 04/08/2016 0318  03/17/2016 0413 04/08/2016 0908 03/31/2016 1625 04-15-16 0400  NA 133*  < > 129* 129* 131* 129*  K 3.9  < > 3.7 3.6 3.8 3.6  CL 97*  < > 92*  --  92* 91*  CO2 23  --  21*  --   --  22  BUN 29*  < > 35*  --  45* 46*  CREATININE 2.97*  < > 3.22*  --  3.50* 3.41*  GLUCOSE 116*  < > 141*  --  104* 104*  < > = values in this interval not displayed.  Electrolytes  Recent Labs Lab 03/26/2016 0318 04/08/2016 0413 04/15/16 0400  CALCIUM 6.4* 7.2* 6.3*  MG 1.4* 1.7 1.7    CBC  Recent Labs Lab 03/30/2016 0318  03/18/2016 0413 04/08/2016 0908 04/08/2016 1625 04/15/2016 0400  WBC 8.5  --  6.8  --   --  5.1  HGB 10.2*  < > 9.7* 10.2* 8.5* 9.6*  HCT 30.4*  < > 28.1* 30.0* 25.0* 27.9*  PLT 57*  --  83*  --   --  70*  < > = values in this interval  not displayed.  Coag's  Recent Labs Lab 04/10/2016 1800 03/26/2016 2008 04/04/2016 0408  APTT 116* 93* 40*  INR 4.44* 1.60 1.23    Sepsis Markers No results for input(s): LATICACIDVEN, PROCALCITON, O2SATVEN in the last 168 hours.  ABG  Recent Labs Lab 04/11/2016 0908 03/31/2016 1044 May 01, 2016 0434  PHART 7.351 7.372 7.445  PCO2ART 39.1 39.4 37.6  PO2ART 104.0 111.0* 80.0*    Liver Enzymes  Recent Labs Lab 04/13/2016 0318 03/30/2016 0413 05/01/16 0400  AST 72* 65* 79*  ALT 48 40 39  ALKPHOS 75 86 92  BILITOT 1.1 1.0 1.1  ALBUMIN 2.4* 2.2* 1.9*    Cardiac Enzymes No results for input(s): TROPONINI, PROBNP in the last 168 hours.  Glucose  Recent Labs Lab 03/17/2016 1144 03/27/2016 1538 04/04/2016 1958 May 01, 2016 0026 05-01-2016 0413 05/01/2016 0807   GLUCAP 104* 107* 121* 100* 127* 111*    Imaging No results found.   STUDIES:    CULTURES: Sputum 3/19 >>  ANTIBIOTICS: Acyclovir 3/8 >>  Tressie Ellis 3/18 x1 Cefuroxime 3/19 >>  SIGNIFICANT EVENTS: 3/15  Admit for CABG x2, intraop bleeding due to aortic adhesions, code, acls  LINES/TUBES: ETT 3/15 >>  IABP 3/15 >>  R Rad Aline 3/15 >>  R IJ Swan 3/15 >>   DISCUSSION: 71 y/o M admitted for re-do CABG x2, complicated by aortic adhesions with intraoperative bleeding returned to ICU on vent with open chest / VAC, IABP and hemodynamic instability.   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 Systolic Heart Failure - postoperative Hx of Syncope thought related to Ischemic Cardiomyopathy Hx of HTN, HLD shock P:  Vasoactive gtt's per CVTS Post-operative care per CVTS Continue lasix gtt, may need increase to bolus 160 q6h or add metalazone Monitor I/O's closely   RENAL A:   Acute on Chronic Renal Failure  ATN P:   Lasix per cvts, consider increase to q6h 160 Chem in am  Would prefer neg balance overall Calcium almost corrects with albumin  GASTROINTESTINAL A:   At Risk Malnutrition  P:   NPO  SUP: PPI  TF to goal  Colace  HEMATOLOGIC A:   Acute Blood Loss Anemia  Thrombocytopenia consumption Hx Multiple Myeloma s/p Bone Marrow Transplant on Maintenance Chemotherapy P:  Trend CBC for plat No Tx needed  INFECTIOUS A:   Open Chest Wound  P:    ABX per cvts  ENDOCRINE A:   DM II    Hypothyroidism  P:   SSI  Continue synthroid  NEUROLOGIC A:   Seizure Disorder - acute seizures vs myoclonus  Peripheral Neuropathy Anoxic injury - prognosis appears poor for functional recovery, may advance to brain death P:   RASS goal: 0 Seizure precautions  Continuous EEG reviewed with neuro Continue keppra, vimpat, valproate, neurontin Neurology following Consider full comfort care, CVTS to d/w family ACLS, aggressive care would be futile  FAMILY  - Updates: No family at  bedside 3/20    - Inter-disciplinary family meet or Palliative Care meeting due by: 3/26  ccm time 30 min   Lavon Paganini. Titus Mould, MD, [place] Pgr: [facility]

## 2016-04-14 NOTE — Progress Notes (Signed)
Patient extubated per "withdrawal of life" orders.  Patient tolerated well.  No complications.

## 2016-04-14 DEATH — deceased

## 2016-04-22 ENCOUNTER — Ambulatory Visit: Payer: Medicare Other | Admitting: Hematology

## 2016-06-15 NOTE — Addendum Note (Signed)
Addendum  created 06/15/16 1110 by Jahon Bart, MD   Sign clinical note    

## 2016-06-17 NOTE — Addendum Note (Signed)
Addendum  created 06/17/16 1226 by Myrtie Soman, MD   Sign clinical note

## 2016-06-17 NOTE — Anesthesia Postprocedure Evaluation (Signed)
Anesthesia Post Note  Patient: Douglas Mccoy  Procedure(s) Performed: Procedure(s) (LRB): REDO CORONARY ARTERY BYPASS GRAFTING x 2 -SVG to OM -SVG to PDA, ENDOSCOPIC HARVEST GREATER SAPHENOUS VEIN -Left Leg AXILLARY CANNULATION, HYPOTHERMIC CIRCULATORY ARREST, REPAIR OF ASCENDING AORTIC INJURY -28 Hemashield Graft, Insertion of Balloon Pump (N/A) TRANSESOPHAGEAL ECHOCARDIOGRAM (TEE) (N/A)     Anesthesia Post Evaluation  Last Vitals:  Vitals:   Apr 08, 2016 1115 April 08, 2016 1200  BP:    Pulse: (!) 164 (!) 176  Resp: (!) 0 (!) 0  Temp: 36.2 C     Last Pain:  Vitals:   08-Apr-2016 0800  TempSrc: Core (Comment)  PainSc:                  Briunna Leicht S

## 2018-06-13 IMAGING — CR DG CHEST 2V
2 series · 2 of 2 positions shown · non-contrast
Comparison: None.

CLINICAL DATA: Hypertension. Shortness of breath and dizzy spells.
History of COPD and diabetes.

EXAM:
CHEST  2 VIEW

[w chest lat]
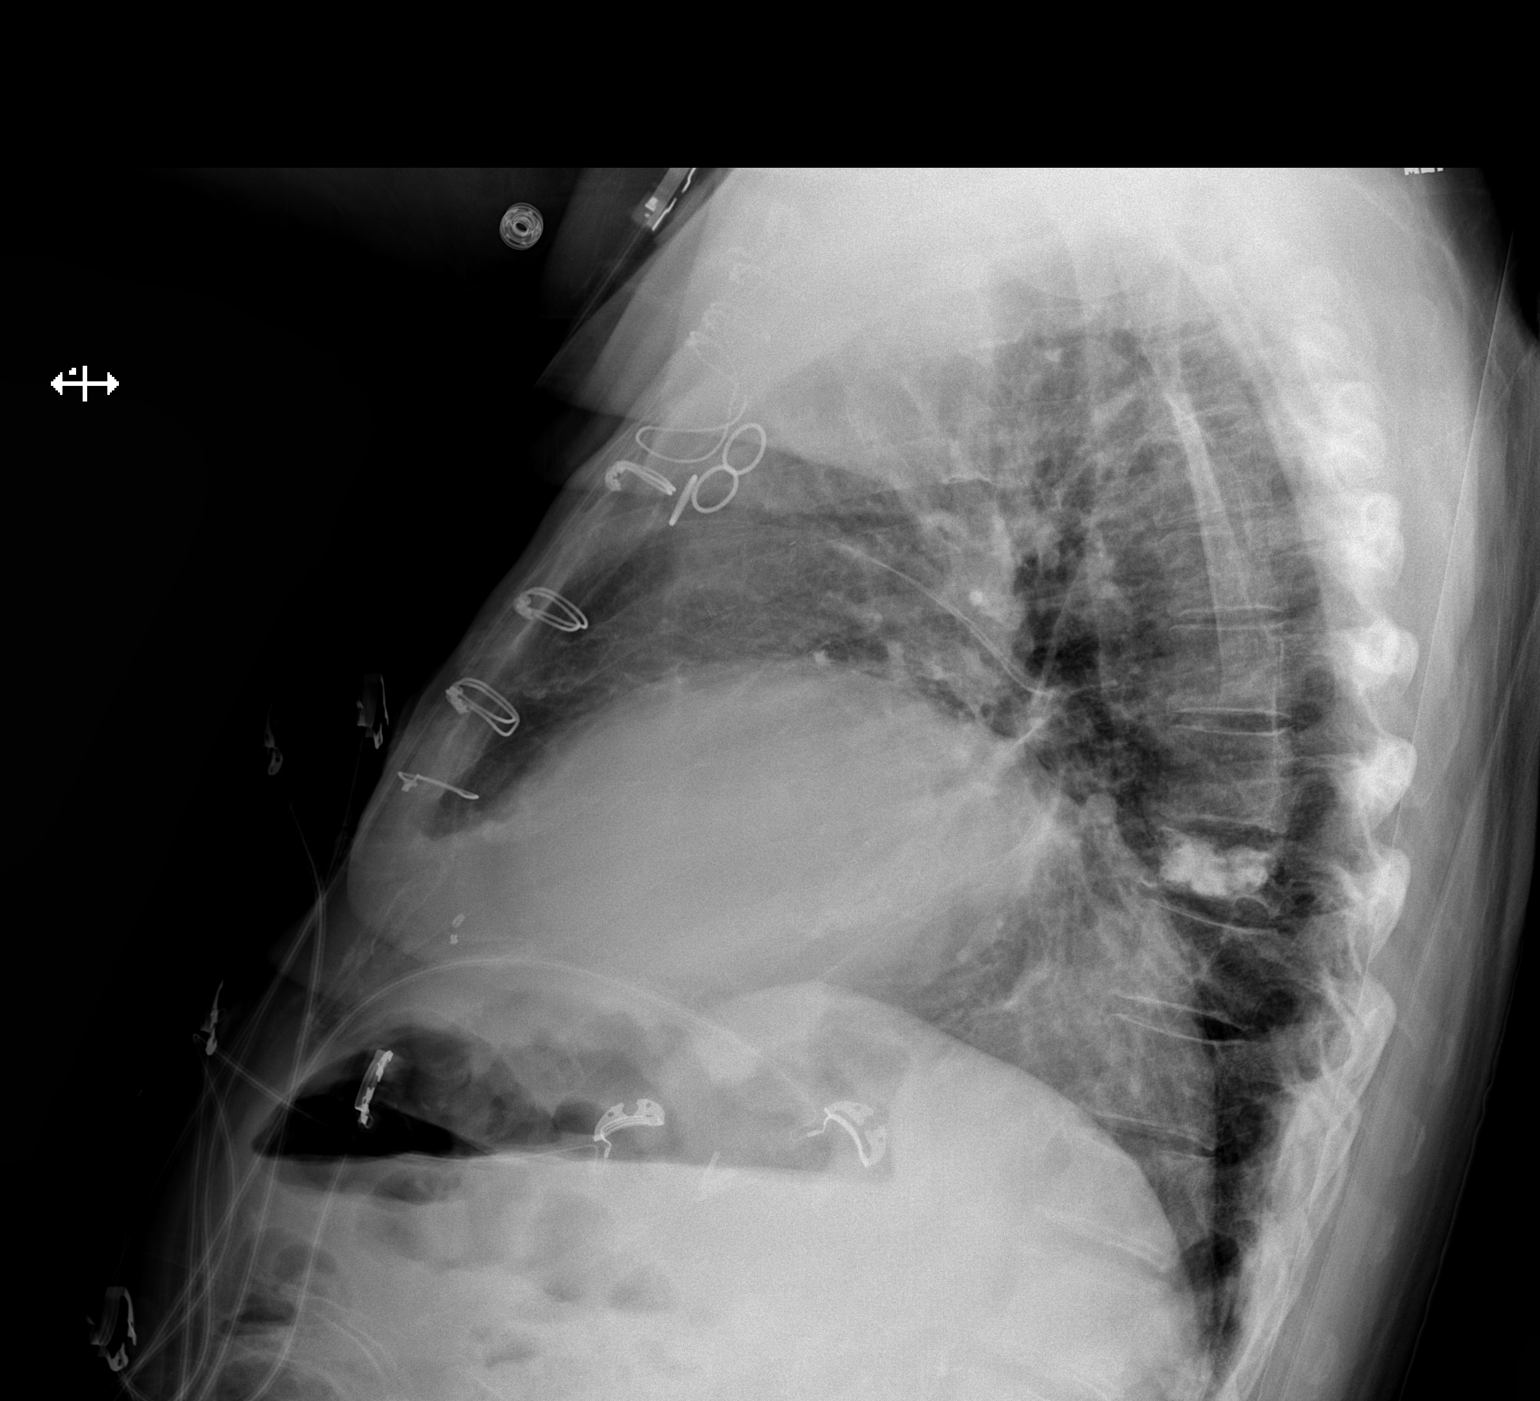

[x chest ap]
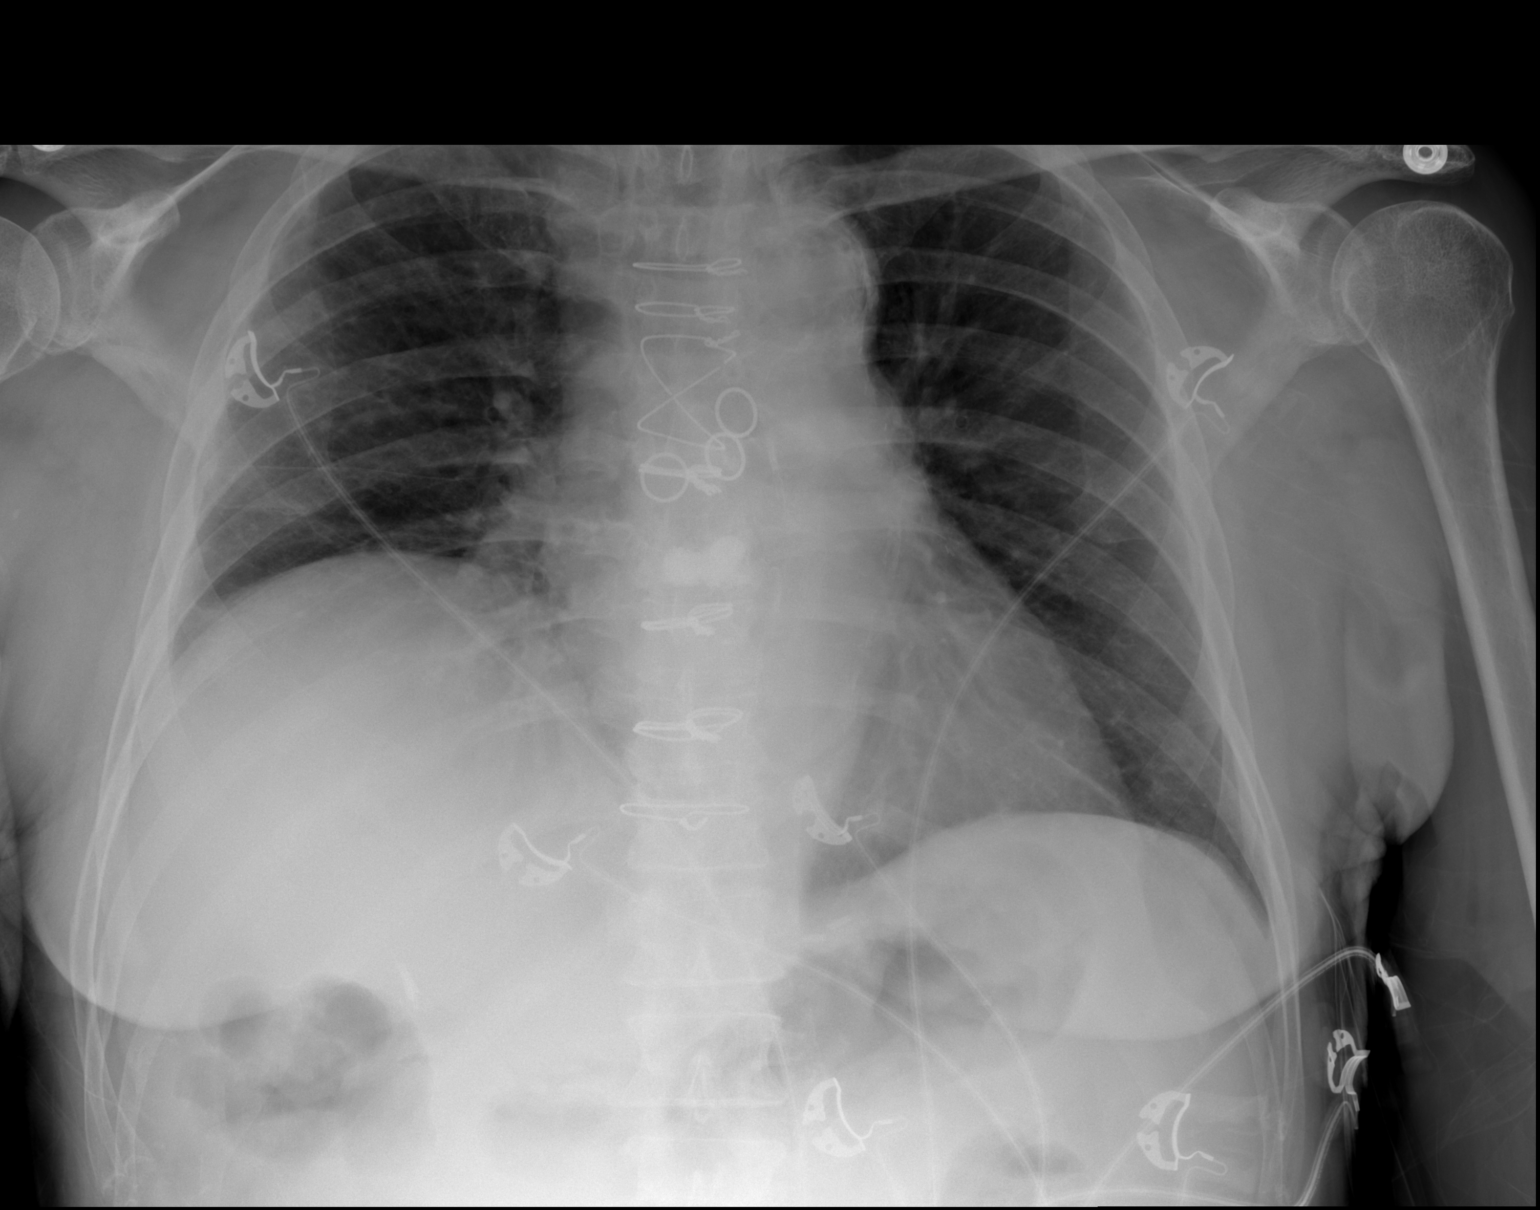

[2 of 2 positions shown; findings below may reference images not displayed]

FINDINGS: Postsurgical changes from CABG.

Cardiomediastinal silhouette is normal. Mediastinal contours appear
intact. There is fullness of the left hilum. Eventration of the
right hemidiaphragm is noted. There is tortuosity and calcific
atherosclerotic disease of the thoracic aorta.

There is no evidence of focal airspace consolidation, pleural
effusion or pneumothorax.

Osseous structures are without acute abnormality. Compression
deformity post vertebroplasty of T8 vertebral body noted. Soft
tissues are grossly normal.
IMPRESSION: Fullness of the left hilum. Differential diagnosis includes
overlapping vascular shadows, left hilar adenopathy or left hilar
mass. Correlation with cross-sectional imaging of the chest will be
useful.

Calcific atherosclerotic disease of the aorta.
